# Patient Record
Sex: Female | Born: 1945 | Race: Black or African American | Hispanic: No | Marital: Married | Smoking: Never smoker
Health system: Southern US, Community
[De-identification: ages and names within clinical notes are randomized; demographics above are authoritative.]

## PROBLEM LIST (undated history)

## (undated) DIAGNOSIS — E785 Hyperlipidemia, unspecified: Secondary | ICD-10-CM

## (undated) DIAGNOSIS — M1990 Unspecified osteoarthritis, unspecified site: Secondary | ICD-10-CM

## (undated) DIAGNOSIS — I639 Cerebral infarction, unspecified: Secondary | ICD-10-CM

## (undated) DIAGNOSIS — I10 Essential (primary) hypertension: Secondary | ICD-10-CM

## (undated) DIAGNOSIS — E119 Type 2 diabetes mellitus without complications: Secondary | ICD-10-CM

## (undated) DIAGNOSIS — Z972 Presence of dental prosthetic device (complete) (partial): Secondary | ICD-10-CM

## (undated) DIAGNOSIS — K219 Gastro-esophageal reflux disease without esophagitis: Secondary | ICD-10-CM

## (undated) HISTORY — PX: VAGINAL HYSTERECTOMY: SUR661

## (undated) HISTORY — PX: TUBAL LIGATION: SHX77

## (undated) HISTORY — PX: UPPER GI ENDOSCOPY: SHX6162

## (undated) HISTORY — PX: COLONOSCOPY: SHX174

---

## 2004-08-06 ENCOUNTER — Ambulatory Visit: Payer: Self-pay | Admitting: Unknown Physician Specialty

## 2006-04-14 ENCOUNTER — Emergency Department: Payer: Self-pay | Admitting: Emergency Medicine

## 2013-10-28 ENCOUNTER — Ambulatory Visit: Payer: Self-pay | Admitting: Gastroenterology

## 2014-01-27 DIAGNOSIS — I639 Cerebral infarction, unspecified: Secondary | ICD-10-CM

## 2014-01-27 HISTORY — DX: Cerebral infarction, unspecified: I63.9

## 2014-02-13 DIAGNOSIS — I10 Essential (primary) hypertension: Secondary | ICD-10-CM | POA: Diagnosis not present

## 2014-02-13 DIAGNOSIS — E782 Mixed hyperlipidemia: Secondary | ICD-10-CM | POA: Diagnosis not present

## 2014-02-13 DIAGNOSIS — Z8673 Personal history of transient ischemic attack (TIA), and cerebral infarction without residual deficits: Secondary | ICD-10-CM | POA: Diagnosis not present

## 2014-02-13 DIAGNOSIS — E1165 Type 2 diabetes mellitus with hyperglycemia: Secondary | ICD-10-CM | POA: Diagnosis not present

## 2014-03-06 DIAGNOSIS — J029 Acute pharyngitis, unspecified: Secondary | ICD-10-CM | POA: Diagnosis not present

## 2014-03-06 DIAGNOSIS — H6503 Acute serous otitis media, bilateral: Secondary | ICD-10-CM | POA: Diagnosis not present

## 2014-03-06 DIAGNOSIS — I10 Essential (primary) hypertension: Secondary | ICD-10-CM | POA: Diagnosis not present

## 2014-03-16 DIAGNOSIS — I10 Essential (primary) hypertension: Secondary | ICD-10-CM | POA: Diagnosis not present

## 2014-03-16 DIAGNOSIS — E1165 Type 2 diabetes mellitus with hyperglycemia: Secondary | ICD-10-CM | POA: Diagnosis not present

## 2014-03-16 DIAGNOSIS — Z8673 Personal history of transient ischemic attack (TIA), and cerebral infarction without residual deficits: Secondary | ICD-10-CM | POA: Diagnosis not present

## 2014-04-07 DIAGNOSIS — M549 Dorsalgia, unspecified: Secondary | ICD-10-CM | POA: Diagnosis not present

## 2014-04-18 ENCOUNTER — Inpatient Hospital Stay: Payer: Self-pay | Admitting: Internal Medicine

## 2014-04-18 DIAGNOSIS — R55 Syncope and collapse: Secondary | ICD-10-CM | POA: Diagnosis not present

## 2014-04-18 DIAGNOSIS — I6782 Cerebral ischemia: Secondary | ICD-10-CM | POA: Diagnosis not present

## 2014-04-18 DIAGNOSIS — E119 Type 2 diabetes mellitus without complications: Secondary | ICD-10-CM | POA: Diagnosis not present

## 2014-04-18 DIAGNOSIS — Z8673 Personal history of transient ischemic attack (TIA), and cerebral infarction without residual deficits: Secondary | ICD-10-CM | POA: Diagnosis not present

## 2014-04-18 DIAGNOSIS — E785 Hyperlipidemia, unspecified: Secondary | ICD-10-CM | POA: Diagnosis not present

## 2014-04-18 DIAGNOSIS — I639 Cerebral infarction, unspecified: Secondary | ICD-10-CM | POA: Diagnosis not present

## 2014-04-18 DIAGNOSIS — I10 Essential (primary) hypertension: Secondary | ICD-10-CM | POA: Diagnosis not present

## 2014-04-18 DIAGNOSIS — Z9889 Other specified postprocedural states: Secondary | ICD-10-CM | POA: Diagnosis not present

## 2014-04-18 DIAGNOSIS — Z880 Allergy status to penicillin: Secondary | ICD-10-CM | POA: Diagnosis not present

## 2014-04-18 DIAGNOSIS — N289 Disorder of kidney and ureter, unspecified: Secondary | ICD-10-CM | POA: Diagnosis not present

## 2014-04-18 DIAGNOSIS — T161XXA Foreign body in right ear, initial encounter: Secondary | ICD-10-CM | POA: Diagnosis not present

## 2014-04-18 DIAGNOSIS — G319 Degenerative disease of nervous system, unspecified: Secondary | ICD-10-CM | POA: Diagnosis not present

## 2014-04-18 DIAGNOSIS — M6281 Muscle weakness (generalized): Secondary | ICD-10-CM | POA: Diagnosis not present

## 2014-04-18 DIAGNOSIS — R531 Weakness: Secondary | ICD-10-CM | POA: Diagnosis not present

## 2014-04-18 DIAGNOSIS — R030 Elevated blood-pressure reading, without diagnosis of hypertension: Secondary | ICD-10-CM | POA: Diagnosis not present

## 2014-04-18 DIAGNOSIS — R270 Ataxia, unspecified: Secondary | ICD-10-CM | POA: Diagnosis not present

## 2014-04-18 DIAGNOSIS — Z90710 Acquired absence of both cervix and uterus: Secondary | ICD-10-CM | POA: Diagnosis not present

## 2014-04-18 DIAGNOSIS — R42 Dizziness and giddiness: Secondary | ICD-10-CM | POA: Diagnosis not present

## 2014-04-18 DIAGNOSIS — I6350 Cerebral infarction due to unspecified occlusion or stenosis of unspecified cerebral artery: Secondary | ICD-10-CM | POA: Diagnosis not present

## 2014-04-20 DIAGNOSIS — T161XXA Foreign body in right ear, initial encounter: Secondary | ICD-10-CM | POA: Diagnosis not present

## 2014-04-21 ENCOUNTER — Ambulatory Visit: Admit: 2014-04-21 | Disposition: A | Discharge status: Home / Self Care | Payer: Self-pay | Admitting: Neurology

## 2014-04-21 LAB — CBC WITH DIFFERENTIAL/PLATELET
BASOS PCT: 0.4 %
Basophil #: 0 10*3/uL (ref 0.0–0.1)
Eosinophil #: 0.1 10*3/uL (ref 0.0–0.7)
Eosinophil %: 0.5 %
HCT: 39.2 % (ref 35.0–47.0)
HGB: 12.2 g/dL (ref 12.0–16.0)
LYMPHS ABS: 1.9 10*3/uL (ref 1.0–3.6)
LYMPHS PCT: 18.9 %
MCH: 21.8 pg — ABNORMAL LOW (ref 26.0–34.0)
MCHC: 31.1 g/dL — AB (ref 32.0–36.0)
MCV: 70 fL — AB (ref 80–100)
MONOS PCT: 4.4 %
Monocyte #: 0.5 x10 3/mm (ref 0.2–0.9)
NEUTROS ABS: 7.8 10*3/uL — AB (ref 1.4–6.5)
Neutrophil %: 75.8 %
PLATELETS: 259 10*3/uL (ref 150–440)
RBC: 5.59 10*6/uL — ABNORMAL HIGH (ref 3.80–5.20)
RDW: 17.5 % — AB (ref 11.5–14.5)
WBC: 10.2 10*3/uL (ref 3.6–11.0)

## 2014-04-21 LAB — BASIC METABOLIC PANEL
Anion Gap: 8 (ref 7–16)
BUN: 30 mg/dL — ABNORMAL HIGH
CO2: 26 mmol/L
CREATININE: 1.2 mg/dL — AB
Calcium, Total: 8.9 mg/dL
Chloride: 104 mmol/L
EGFR (African American): 53 — ABNORMAL LOW
EGFR (Non-African Amer.): 46 — ABNORMAL LOW
Glucose: 126 mg/dL — ABNORMAL HIGH
Potassium: 4.1 mmol/L
Sodium: 138 mmol/L

## 2014-05-03 DIAGNOSIS — M5432 Sciatica, left side: Secondary | ICD-10-CM | POA: Diagnosis not present

## 2014-05-03 DIAGNOSIS — N189 Chronic kidney disease, unspecified: Secondary | ICD-10-CM | POA: Diagnosis not present

## 2014-05-03 DIAGNOSIS — M5136 Other intervertebral disc degeneration, lumbar region: Secondary | ICD-10-CM | POA: Diagnosis not present

## 2014-05-03 DIAGNOSIS — I10 Essential (primary) hypertension: Secondary | ICD-10-CM | POA: Diagnosis not present

## 2014-05-03 DIAGNOSIS — E1122 Type 2 diabetes mellitus with diabetic chronic kidney disease: Secondary | ICD-10-CM | POA: Diagnosis not present

## 2014-05-03 DIAGNOSIS — Z789 Other specified health status: Secondary | ICD-10-CM | POA: Diagnosis not present

## 2014-05-08 DIAGNOSIS — M1612 Unilateral primary osteoarthritis, left hip: Secondary | ICD-10-CM | POA: Diagnosis not present

## 2014-05-08 DIAGNOSIS — Z7982 Long term (current) use of aspirin: Secondary | ICD-10-CM | POA: Diagnosis not present

## 2014-05-08 DIAGNOSIS — M25552 Pain in left hip: Secondary | ICD-10-CM | POA: Diagnosis not present

## 2014-05-08 DIAGNOSIS — M545 Low back pain: Secondary | ICD-10-CM | POA: Diagnosis not present

## 2014-05-08 DIAGNOSIS — M549 Dorsalgia, unspecified: Secondary | ICD-10-CM | POA: Diagnosis not present

## 2014-05-08 DIAGNOSIS — I10 Essential (primary) hypertension: Secondary | ICD-10-CM | POA: Diagnosis not present

## 2014-05-08 DIAGNOSIS — Z7902 Long term (current) use of antithrombotics/antiplatelets: Secondary | ICD-10-CM | POA: Diagnosis not present

## 2014-05-08 DIAGNOSIS — R52 Pain, unspecified: Secondary | ICD-10-CM | POA: Diagnosis not present

## 2014-05-08 DIAGNOSIS — E119 Type 2 diabetes mellitus without complications: Secondary | ICD-10-CM | POA: Diagnosis not present

## 2014-05-08 DIAGNOSIS — Z8673 Personal history of transient ischemic attack (TIA), and cerebral infarction without residual deficits: Secondary | ICD-10-CM | POA: Diagnosis not present

## 2014-05-24 DIAGNOSIS — M4806 Spinal stenosis, lumbar region: Secondary | ICD-10-CM | POA: Diagnosis not present

## 2014-05-24 DIAGNOSIS — M47816 Spondylosis without myelopathy or radiculopathy, lumbar region: Secondary | ICD-10-CM | POA: Diagnosis not present

## 2014-05-24 DIAGNOSIS — M5116 Intervertebral disc disorders with radiculopathy, lumbar region: Secondary | ICD-10-CM | POA: Diagnosis not present

## 2014-05-24 DIAGNOSIS — M5117 Intervertebral disc disorders with radiculopathy, lumbosacral region: Secondary | ICD-10-CM | POA: Diagnosis not present

## 2014-05-28 NOTE — Consult Note (Signed)
PATIENT NAME:  Ann OddiOTEAT, Ann Price MR#:  [id number] DATE OF BIRTH:  02-Jan-1946  DATE OF CONSULTATION:  04/18/2014  REFERRING PHYSICIAN:  Dr. Thedore MinsSingh. CONSULTING PHYSICIAN:  Davina Pokehapman T. Kamryn Messineo, MD  CONSULTATION REQUESTED FOR:  Foreign body in right ear.   HISTORY OF PRESENT ILLNESS: This is a 69 year old female who was admitted through the Emergency Room for weakness, and disorientation, and slurred speech.  This was a question of possible CVA. She underwent a physical exam and CT that showed a metallic foreign body in the right ear.  ENT was called for evaluation and possible removal of foreign body before MRI was performed.   PAST MEDICAL HISTORY: Significant for TIA, diabetes, hypertension, hyperlipidemia.    SURGICAL HISTORY:  hysterectomy, eyelid surgery.   ALLERGIES: ASPIRIN AND PENICILLIN.   MEDICATIONS: Noted and listed in the chart.   PHYSICAL EXAMINATION: I am seeing the patient in the echocardiogram as she was not in her room, so I saw her in the echo lab lying supine, answering questions appropriately. When I asked her about the foreign body, she says she thinks it has been in there for some time, but she is not sure.  She thinks it is part of an earring.   PHYSICAL EXAMINATION:   HEENT AND NECK:  The left ear was clear.  The anterior nose is patent. The oral cavity and oropharynx are benign. Palpation of the neck unremarkable. Examination of the right ear showed a metallic foreign body completely filling the external ear. Multiple attempts at the bedside using an alligator forcep to remove the tube were unsuccessful and due to pain the process needed to be aborted.   HEART:  Regular rhythm. LUNGS:  Clear to auscultation.  IMPRESSION:  Speaking with the nurse as well as with the nursing supervisor, it was not felt safe for her to be transported via wheelchair across the parking lot to our office for removal and I did not feel it warranted an EMS transport vehicle to bring her to the  office. Therefore, I spoke with my partner Dr. Bud Facereighton Vaught who is in the operating room tomorrow. He has agreed to take her to the operating room and likely without any anesthesia remove his easily under the microscope. I have relayed that to Dr. Thedore MinsSingh as well as the nurse and nursing supervisor, and hopefully we will get this scheduled tomorrow.     ____________________________ Davina Pokehapman T. Miyako Oelke, MD ctm:sp D: 04/19/2014 14:02:00 ET T: 04/19/2014 14:45:22 ET JOB#: [id number]  cc: Davina Pokehapman T. Trenika Hudson, MD, <Dictator> Davina PokeHAPMAN T Kabria Hetzer MD ELECTRONICALLY SIGNED 04/21/2014 8:15

## 2014-05-28 NOTE — Op Note (Signed)
PATIENT NAME:  Ann OddiOTEAT, Ann Price MR#:  [id number] DATE OF BIRTH:  05-26-1945  DATE OF PROCEDURE:  04/20/2014  PREOPERATIVE DIAGNOSIS: Foreign body in the right ear.   POSTOPERATIVE DIAGNOSIS: Foreign body in the right ear.Marland Kitchen.  PROCEDURE PERFORMED: Binocular microscopy with removal of foreign body under local anesthesia.   ESTIMATED BLOOD LOSS: Zero.   INTRAVENOUS FLUIDS: None.   SPECIMENS: Foreign body of the right ear consistent with an earring back.   COMPLICATIONS: None.  MEDICATIONS: Lidocaine 1% at 0.75 mL with 1:100,000 epinephrine.   INDICATIONS FOR PROCEDURE: The patient is a 69 year old female with a history of foreign body in the right ear as well as possible stroke. The foreign body metallic was noted and the patient was unable to get an MRI scan. This was attempted to be removed at bedside and due to patient tolerance was unable to be done.   OPERATIVE FINDINGS: Foreign body consistent with a metallic back of any hearing that was wedged against the drum. It looks like it had been in there for quite an amount of time given the remodeling of the canal skin around it. The canal had to be numbed with 1% lidocaine prior to removal.   DESCRIPTION OF PROCEDURE: The patient identified in holding, benefits and risks of the procedure were discussed and consent was reviewed. The patient was taken to the operating room and placed in a supine position in her hospital bed. The operating microscope was brought into the field. An appropriate size speculum was placed in the patient's right external auditory canal. This demonstrated a metallic foreign body. This was attempted be removed with a right angle pick as well as a 45-degree curved alligator forceps. Due to the patient's tolerance, this unfortunately was unable to be performed. Therefore, the patient's ear was anesthetized with 0.75 mL of 1% lidocaine with 1:100,000 epinephrine on a 25-gauge needle in the anterior and posterior quadrant of the  canal. After this was performed, the patient's pain tolerance was improved and the foreign body was then be easily removed with 45-degree alligator forceps. Ciprodex drops were placed and the care of the patient was transferred to anesthesia.    ____________________________ Kyung Ruddreighton C. Lue [place], MD ccv:bm D: 04/20/2014 14:30:02 ET T: 04/21/2014 00:43:01 ET JOB#: [id number]  cc: Kyung Ruddreighton C. Diona Peregoy, MD, <Dictator> Kyung RuddREIGHTON C Jaelee Laughter MD ELECTRONICALLY SIGNED 05/10/2014 17:33
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
## 2014-05-28 NOTE — Discharge Summary (Signed)
PATIENT NAME:  Ann Price, Ann Price MR#:  [id number] DATE OF BIRTH:  [birth year]  DATE OF ADMISSION:  04/18/2014 DATE OF DISCHARGE:  04/21/2014  DISCHARGE DIAGNOSES:  1. Right caudate cerebrovascular accident.  2. Left sciatica, radiculopathy.  3. Diabetes mellitus, non-insulin-requiring.  4. Cardiovascular disease.  5. Hypertension.  6. Hyperlipidemia.  7. Foreign body right ear.   DISCHARGE MEDICATIONS: Actos 45 mg daily, omeprazole 20 mg daily, metformin 500 mg 2 tabs b.i.d., losartan 100 mg daily, HCTZ 25 mg at bedtime, glipizide XL 10 mg 2 tabs daily, Plavix 75 mg daily, atorvastatin 80 mg at bedtime, atenolol 50 mg at bedtime, amlodipine 10 mg at bedtime, aspirin 81 mg daily x 2 weeks, etodolac 400 mg b.i.d. x 10 days.   REASON FOR ADMISSION: A 69 year old female who presents with left arm weakness and left sciatica. Please see H and P for HPI and physical exam.   HOSPITAL COURSE: The patient was admitted, found to have a right caudate infarct by MRI, carotid Doppler negative. Her left arm dramatically improved. Aspirin irritates her stomach and she said she will just take that with food, but is not allergic to it per se. She will be on a baby aspirin for 2 weeks. Her main complaint was left sciatica. She was given Solu-Medrol 40 mg IV x 1 and will be on a short course of etodolac, watching gastric symptoms. She will follow up with Dr. Thedore MinsSingh in 1 week, continue tight control of diabetes and hypertension.      ____________________________ Danella PentonMark F. Wyoma Genson, MD mfm:bu D: 04/21/2014 08:05:53 ET T: 04/21/2014 13:49:57 ET JOB#: [id number]  cc: Danella PentonMark F. Raeshawn Tafolla, MD, <Dictator> Danella PentonMARK F Carolann Brazell MD ELECTRONICALLY SIGNED 04/22/2014 10:07

## 2014-05-28 NOTE — Consult Note (Signed)
PATIENT NAME:  Ann OddiOTEAT, Amiah L MR#:  [id number] DATE OF BIRTH:  Aug 27, 1945  DATE OF CONSULTATION:  04/21/2014  REFERRING PHYSICIAN:   CONSULTING PHYSICIAN:  Pauletta BrownsYuriy Anastassia Noack, MD  REASON FOR CONSULTATION:  Suspected stroke.   HISTORY OF PRESENT ILLNESS: A 69 year old female admitted with left-sided weakness and foreign body found in the right ear.  The patient was on Plavix and aspirin at home. The patient complained of left hand grip weakness. The patient was found to have right quadrant infarct strength significantly improved. The patient is able to move her upper left and left lower extremity. The patient was able to ambulate with limited assistance. The patient is on Plavix and statin at home.   HOME MEDICATIONS: Reviewed.   ALLERGIES: ASPIRIN.   NEUROLOGICAL EVALUATION:  The patient is alert, awake, oriented to time, place, location and the reason why she is in the hospital.  Facial sensation intact.  Facial motor is intact. Tongue is midline. Uvula elevates symmetrically. Shoulder shrug intact.  Motor strength: Left hand grip weakness, left lower extremity is symmetrical, right lower extremity no focal weakness. Sensation intact.  Reflexes symmetrical. Coordination intact.   IMPRESSION: A 69 year old female right caudate infarct with left hand grip weakness that has improved. The patient is being discharged status post ENT procedure.  Continue Plavix and statin. Follow up with neurology as an outpatient.   Thank you, it was a pleasure seeing this patient.     ____________________________ Pauletta BrownsYuriy Ronalda Walpole, MD yz:DT D: 04/21/2014 12:11:41 ET T: 04/21/2014 12:46:04 ET JOB#: [id number]  cc: Pauletta BrownsYuriy Justinian Miano, MD, <Dictator> Pauletta BrownsYURIY Skilynn Durney MD ELECTRONICALLY SIGNED 05/02/2014 15:40

## 2014-05-29 DIAGNOSIS — R609 Edema, unspecified: Secondary | ICD-10-CM | POA: Diagnosis not present

## 2014-05-29 DIAGNOSIS — M5416 Radiculopathy, lumbar region: Secondary | ICD-10-CM | POA: Diagnosis not present

## 2014-06-12 DIAGNOSIS — I6990 Unspecified sequelae of unspecified cerebrovascular disease: Secondary | ICD-10-CM | POA: Diagnosis not present

## 2014-06-12 DIAGNOSIS — M5416 Radiculopathy, lumbar region: Secondary | ICD-10-CM | POA: Diagnosis not present

## 2014-06-12 DIAGNOSIS — M25552 Pain in left hip: Secondary | ICD-10-CM | POA: Diagnosis not present

## 2014-07-10 ENCOUNTER — Other Ambulatory Visit: Payer: Self-pay | Admitting: Internal Medicine

## 2014-07-10 DIAGNOSIS — Z1239 Encounter for other screening for malignant neoplasm of breast: Secondary | ICD-10-CM | POA: Diagnosis not present

## 2014-07-10 DIAGNOSIS — I10 Essential (primary) hypertension: Secondary | ICD-10-CM | POA: Diagnosis not present

## 2014-07-10 DIAGNOSIS — Z1231 Encounter for screening mammogram for malignant neoplasm of breast: Secondary | ICD-10-CM

## 2014-07-10 DIAGNOSIS — D649 Anemia, unspecified: Secondary | ICD-10-CM | POA: Diagnosis not present

## 2014-07-10 DIAGNOSIS — E1165 Type 2 diabetes mellitus with hyperglycemia: Secondary | ICD-10-CM | POA: Diagnosis not present

## 2014-07-11 DIAGNOSIS — M5416 Radiculopathy, lumbar region: Secondary | ICD-10-CM | POA: Diagnosis not present

## 2014-07-18 DIAGNOSIS — M5416 Radiculopathy, lumbar region: Secondary | ICD-10-CM | POA: Diagnosis not present

## 2014-07-20 ENCOUNTER — Ambulatory Visit: Payer: Self-pay

## 2014-07-25 DIAGNOSIS — M5416 Radiculopathy, lumbar region: Secondary | ICD-10-CM | POA: Diagnosis not present

## 2014-08-01 DIAGNOSIS — M5416 Radiculopathy, lumbar region: Secondary | ICD-10-CM | POA: Diagnosis not present

## 2014-08-10 ENCOUNTER — Other Ambulatory Visit: Payer: Self-pay | Admitting: Internal Medicine

## 2014-08-10 ENCOUNTER — Ambulatory Visit
Admission: RE | Admit: 2014-08-10 | Discharge: 2014-08-10 | Disposition: A | Discharge status: Home / Self Care | Payer: Commercial Managed Care - HMO | Source: Ambulatory Visit | Attending: Internal Medicine | Admitting: Internal Medicine

## 2014-08-10 DIAGNOSIS — Z1231 Encounter for screening mammogram for malignant neoplasm of breast: Secondary | ICD-10-CM | POA: Insufficient documentation

## 2014-08-10 DIAGNOSIS — R922 Inconclusive mammogram: Secondary | ICD-10-CM | POA: Diagnosis not present

## 2014-08-17 ENCOUNTER — Other Ambulatory Visit: Payer: Self-pay | Admitting: Internal Medicine

## 2014-08-17 DIAGNOSIS — R921 Mammographic calcification found on diagnostic imaging of breast: Secondary | ICD-10-CM

## 2014-08-17 DIAGNOSIS — R928 Other abnormal and inconclusive findings on diagnostic imaging of breast: Secondary | ICD-10-CM

## 2014-08-22 ENCOUNTER — Other Ambulatory Visit: Payer: Self-pay | Admitting: Internal Medicine

## 2014-08-22 ENCOUNTER — Ambulatory Visit
Admission: RE | Admit: 2014-08-22 | Discharge: 2014-08-22 | Disposition: A | Discharge status: Home / Self Care | Payer: Commercial Managed Care - HMO | Source: Ambulatory Visit | Attending: Internal Medicine | Admitting: Internal Medicine

## 2014-08-22 ENCOUNTER — Ambulatory Visit: Payer: Commercial Managed Care - HMO

## 2014-08-22 DIAGNOSIS — R921 Mammographic calcification found on diagnostic imaging of breast: Secondary | ICD-10-CM | POA: Diagnosis not present

## 2014-08-22 DIAGNOSIS — R928 Other abnormal and inconclusive findings on diagnostic imaging of breast: Secondary | ICD-10-CM

## 2014-08-22 DIAGNOSIS — R922 Inconclusive mammogram: Secondary | ICD-10-CM | POA: Diagnosis not present

## 2014-08-30 LAB — LIPID PANEL
CHOLESTEROL: 181 mg/dL
HDL Cholesterol: 49 mg/dL
LDL CHOLESTEROL, CALC: 110 mg/dL — AB
Triglycerides: 111 mg/dL
VLDL CHOLESTEROL, CALC: 22 mg/dL

## 2014-08-30 LAB — BASIC METABOLIC PANEL
Anion Gap: 6 — ABNORMAL LOW (ref 7–16)
BUN: 28 mg/dL — AB
Calcium, Total: 9 mg/dL
Chloride: 106 mmol/L
Co2: 26 mmol/L
Creatinine: 1.21 mg/dL — ABNORMAL HIGH
EGFR (African American): 53 — ABNORMAL LOW
EGFR (Non-African Amer.): 45 — ABNORMAL LOW
Glucose: 155 mg/dL — ABNORMAL HIGH
POTASSIUM: 4 mmol/L
SODIUM: 138 mmol/L

## 2014-08-30 LAB — URINALYSIS, COMPLETE
BILIRUBIN, UR: NEGATIVE
Bacteria: NONE SEEN
Blood: NEGATIVE
Glucose,UR: 50 mg/dL (ref 0–75)
Ketone: NEGATIVE
Leukocyte Esterase: NEGATIVE
NITRITE: NEGATIVE
PH: 5 (ref 4.5–8.0)
Protein: NEGATIVE
Specific Gravity: 1.028 (ref 1.003–1.030)

## 2014-08-30 LAB — CBC WITH DIFFERENTIAL/PLATELET
Basophil #: 0 10*3/uL (ref 0.0–0.1)
Basophil %: 0.5 %
Eosinophil #: 0.1 10*3/uL (ref 0.0–0.7)
Eosinophil %: 0.7 %
HCT: 38.5 % (ref 35.0–47.0)
HGB: 11.8 g/dL — ABNORMAL LOW (ref 12.0–16.0)
Lymphocyte #: 2.3 10*3/uL (ref 1.0–3.6)
Lymphocyte %: 23.5 %
MCH: 21.7 pg — ABNORMAL LOW (ref 26.0–34.0)
MCHC: 30.6 g/dL — ABNORMAL LOW (ref 32.0–36.0)
MCV: 71 fL — AB (ref 80–100)
Monocyte #: 0.5 x10 3/mm (ref 0.2–0.9)
Monocyte %: 5.1 %
NEUTROS ABS: 7 10*3/uL — AB (ref 1.4–6.5)
NEUTROS PCT: 70.2 %
Platelet: 253 10*3/uL (ref 150–440)
RBC: 5.44 10*6/uL — AB (ref 3.80–5.20)
RDW: 17.3 % — ABNORMAL HIGH (ref 11.5–14.5)
WBC: 10 10*3/uL (ref 3.6–11.0)

## 2014-10-04 DIAGNOSIS — D649 Anemia, unspecified: Secondary | ICD-10-CM | POA: Diagnosis not present

## 2014-10-04 DIAGNOSIS — E1165 Type 2 diabetes mellitus with hyperglycemia: Secondary | ICD-10-CM | POA: Diagnosis not present

## 2014-10-09 DIAGNOSIS — E1165 Type 2 diabetes mellitus with hyperglycemia: Secondary | ICD-10-CM | POA: Diagnosis not present

## 2014-10-09 DIAGNOSIS — E1122 Type 2 diabetes mellitus with diabetic chronic kidney disease: Secondary | ICD-10-CM | POA: Diagnosis not present

## 2014-10-09 DIAGNOSIS — Z8673 Personal history of transient ischemic attack (TIA), and cerebral infarction without residual deficits: Secondary | ICD-10-CM | POA: Diagnosis not present

## 2014-10-09 DIAGNOSIS — Z0000 Encounter for general adult medical examination without abnormal findings: Secondary | ICD-10-CM | POA: Diagnosis not present

## 2015-01-05 ENCOUNTER — Emergency Department
Admission: EM | Admit: 2015-01-05 | Discharge: 2015-01-05 | Disposition: A | Discharge status: Home / Self Care | Payer: No Typology Code available for payment source | Attending: Emergency Medicine | Admitting: Emergency Medicine

## 2015-01-05 ENCOUNTER — Encounter: Payer: Self-pay | Admitting: Emergency Medicine

## 2015-01-05 ENCOUNTER — Emergency Department: Payer: No Typology Code available for payment source

## 2015-01-05 DIAGNOSIS — E119 Type 2 diabetes mellitus without complications: Secondary | ICD-10-CM | POA: Insufficient documentation

## 2015-01-05 DIAGNOSIS — Y998 Other external cause status: Secondary | ICD-10-CM | POA: Diagnosis not present

## 2015-01-05 DIAGNOSIS — M25461 Effusion, right knee: Secondary | ICD-10-CM | POA: Diagnosis not present

## 2015-01-05 DIAGNOSIS — S8001XA Contusion of right knee, initial encounter: Secondary | ICD-10-CM | POA: Insufficient documentation

## 2015-01-05 DIAGNOSIS — S39012A Strain of muscle, fascia and tendon of lower back, initial encounter: Secondary | ICD-10-CM | POA: Diagnosis not present

## 2015-01-05 DIAGNOSIS — S8002XA Contusion of left knee, initial encounter: Secondary | ICD-10-CM | POA: Diagnosis not present

## 2015-01-05 DIAGNOSIS — Y92410 Unspecified street and highway as the place of occurrence of the external cause: Secondary | ICD-10-CM | POA: Insufficient documentation

## 2015-01-05 DIAGNOSIS — I10 Essential (primary) hypertension: Secondary | ICD-10-CM | POA: Insufficient documentation

## 2015-01-05 DIAGNOSIS — S3992XA Unspecified injury of lower back, initial encounter: Secondary | ICD-10-CM | POA: Diagnosis not present

## 2015-01-05 DIAGNOSIS — Y9389 Activity, other specified: Secondary | ICD-10-CM | POA: Diagnosis not present

## 2015-01-05 DIAGNOSIS — M25562 Pain in left knee: Secondary | ICD-10-CM | POA: Diagnosis not present

## 2015-01-05 DIAGNOSIS — M2550 Pain in unspecified joint: Secondary | ICD-10-CM | POA: Diagnosis not present

## 2015-01-05 DIAGNOSIS — M549 Dorsalgia, unspecified: Secondary | ICD-10-CM | POA: Diagnosis not present

## 2015-01-05 DIAGNOSIS — M545 Low back pain: Secondary | ICD-10-CM | POA: Diagnosis not present

## 2015-01-05 HISTORY — DX: Cerebral infarction, unspecified: I63.9

## 2015-01-05 HISTORY — DX: Hyperlipidemia, unspecified: E78.5

## 2015-01-05 HISTORY — DX: Type 2 diabetes mellitus without complications: E11.9

## 2015-01-05 HISTORY — DX: Essential (primary) hypertension: I10

## 2015-01-05 MED ORDER — HYDROCODONE-ACETAMINOPHEN 5-325 MG PO TABS: 1.0000 | ORAL_TABLET | Freq: Four times a day (QID) | ORAL | Status: DC | PRN

## 2015-01-05 MED ORDER — HYDROCODONE-ACETAMINOPHEN 5-325 MG PO TABS
1.0000 | ORAL_TABLET | Freq: Once | ORAL | Status: AC
  Administered 2015-01-05: 1 via ORAL
  Filled 2015-01-05: qty 1

## 2015-01-05 MED ORDER — DIAZEPAM 2 MG PO TABS: 2.0000 mg | ORAL_TABLET | Freq: Three times a day (TID) | ORAL | Status: DC | PRN

## 2015-01-05 MED ORDER — NAPROXEN 500 MG PO TBEC: 500.0000 mg | DELAYED_RELEASE_TABLET | Freq: Two times a day (BID) | ORAL | Status: DC

## 2015-01-05 MED ORDER — DIAZEPAM 5 MG PO TABS
5.0000 mg | ORAL_TABLET | Freq: Once | ORAL | Status: AC
  Administered 2015-01-05: 5 mg via ORAL
  Filled 2015-01-05: qty 1

## 2015-01-05 NOTE — ED Notes (Signed)
Patient transported to X-ray 
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
## 2015-01-16 DIAGNOSIS — M25562 Pain in left knee: Secondary | ICD-10-CM | POA: Diagnosis not present

## 2015-01-16 DIAGNOSIS — M25561 Pain in right knee: Secondary | ICD-10-CM | POA: Diagnosis not present

## 2015-02-07 ENCOUNTER — Other Ambulatory Visit: Payer: Self-pay | Admitting: Orthopedic Surgery

## 2015-02-07 DIAGNOSIS — M25551 Pain in right hip: Secondary | ICD-10-CM | POA: Diagnosis not present

## 2015-02-07 DIAGNOSIS — M170 Bilateral primary osteoarthritis of knee: Secondary | ICD-10-CM | POA: Diagnosis not present

## 2015-02-07 DIAGNOSIS — S83231A Complex tear of medial meniscus, current injury, right knee, initial encounter: Secondary | ICD-10-CM

## 2015-02-21 DIAGNOSIS — Z794 Long term (current) use of insulin: Secondary | ICD-10-CM | POA: Diagnosis not present

## 2015-02-21 DIAGNOSIS — E1165 Type 2 diabetes mellitus with hyperglycemia: Secondary | ICD-10-CM | POA: Diagnosis not present

## 2015-02-21 DIAGNOSIS — I10 Essential (primary) hypertension: Secondary | ICD-10-CM | POA: Diagnosis not present

## 2015-02-21 DIAGNOSIS — N183 Chronic kidney disease, stage 3 (moderate): Secondary | ICD-10-CM | POA: Diagnosis not present

## 2015-02-21 DIAGNOSIS — E1122 Type 2 diabetes mellitus with diabetic chronic kidney disease: Secondary | ICD-10-CM | POA: Diagnosis not present

## 2015-02-27 ENCOUNTER — Ambulatory Visit
Admission: RE | Admit: 2015-02-27 | Discharge: 2015-02-27 | Disposition: A | Discharge status: Home / Self Care | Payer: No Typology Code available for payment source | Source: Ambulatory Visit | Attending: Orthopedic Surgery | Admitting: Orthopedic Surgery

## 2015-02-27 DIAGNOSIS — M23321 Other meniscus derangements, posterior horn of medial meniscus, right knee: Secondary | ICD-10-CM | POA: Diagnosis not present

## 2015-02-27 DIAGNOSIS — M25461 Effusion, right knee: Secondary | ICD-10-CM | POA: Diagnosis present

## 2015-02-27 DIAGNOSIS — M1711 Unilateral primary osteoarthritis, right knee: Secondary | ICD-10-CM | POA: Insufficient documentation

## 2015-02-27 DIAGNOSIS — M25561 Pain in right knee: Secondary | ICD-10-CM | POA: Diagnosis present

## 2015-02-27 DIAGNOSIS — S83231A Complex tear of medial meniscus, current injury, right knee, initial encounter: Secondary | ICD-10-CM

## 2015-02-27 DIAGNOSIS — M179 Osteoarthritis of knee, unspecified: Secondary | ICD-10-CM | POA: Diagnosis not present

## 2015-02-27 DIAGNOSIS — S83241A Other tear of medial meniscus, current injury, right knee, initial encounter: Secondary | ICD-10-CM | POA: Insufficient documentation

## 2015-03-09 DIAGNOSIS — S83231A Complex tear of medial meniscus, current injury, right knee, initial encounter: Secondary | ICD-10-CM | POA: Diagnosis not present

## 2015-03-14 ENCOUNTER — Encounter
Admission: RE | Admit: 2015-03-14 | Discharge: 2015-03-14 | Disposition: A | Discharge status: Home / Self Care | Payer: Commercial Managed Care - HMO | Source: Ambulatory Visit | Attending: Orthopedic Surgery | Admitting: Orthopedic Surgery

## 2015-03-14 ENCOUNTER — Other Ambulatory Visit: Payer: Self-pay

## 2015-03-14 DIAGNOSIS — Z01812 Encounter for preprocedural laboratory examination: Secondary | ICD-10-CM | POA: Insufficient documentation

## 2015-03-14 DIAGNOSIS — Z01810 Encounter for preprocedural cardiovascular examination: Secondary | ICD-10-CM | POA: Insufficient documentation

## 2015-03-14 HISTORY — DX: Gastro-esophageal reflux disease without esophagitis: K21.9

## 2015-03-14 LAB — APTT: aPTT: 29 seconds (ref 24–36)

## 2015-03-14 LAB — BASIC METABOLIC PANEL
Anion gap: 6 (ref 5–15)
BUN: 21 mg/dL — AB (ref 6–20)
CHLORIDE: 106 mmol/L (ref 101–111)
CO2: 26 mmol/L (ref 22–32)
CREATININE: 1.21 mg/dL — AB (ref 0.44–1.00)
Calcium: 8.9 mg/dL (ref 8.9–10.3)
GFR calc Af Amer: 51 mL/min — ABNORMAL LOW (ref >60)
GFR calc non Af Amer: 44 mL/min — ABNORMAL LOW (ref >60)
GLUCOSE: 199 mg/dL — AB (ref 65–99)
Potassium: 3.7 mmol/L (ref 3.5–5.1)
Sodium: 138 mmol/L (ref 135–145)

## 2015-03-14 LAB — CBC
HCT: 38 % (ref 35.0–47.0)
Hemoglobin: 11.9 g/dL — ABNORMAL LOW (ref 12.0–16.0)
MCH: 21.8 pg — AB (ref 26.0–34.0)
MCHC: 31.4 g/dL — AB (ref 32.0–36.0)
MCV: 69.5 fL — AB (ref 80.0–100.0)
PLATELETS: 204 10*3/uL (ref 150–440)
RBC: 5.47 MIL/uL — AB (ref 3.80–5.20)
RDW: 17.8 % — ABNORMAL HIGH (ref 11.5–14.5)
WBC: 5 10*3/uL (ref 3.6–11.0)

## 2015-03-14 LAB — PROTIME-INR
INR: 1.04
PROTHROMBIN TIME: 13.8 s (ref 11.4–15.0)

## 2015-03-14 NOTE — Patient Instructions (Signed)
  Your procedure is scheduled on: Thursday 03/22/15 Report to Day Surgery. 2ND FLOOR MEDICAL MALL ENTRANCE To find out your arrival time please call [phone] between 1PM - 3PM on Wednesday 03/21/15.  Remember: Instructions that are not followed completely may result in serious medical risk, up to and including death, or upon the discretion of your surgeon and anesthesiologist your surgery may need to be rescheduled.    __X__ 1. Do not eat food or drink liquids after midnight. No gum chewing or hard candies.     __X__ 2. No Alcohol for 24 hours before or after surgery.   ____ 3. Bring all medications with you on the day of surgery if instructed.    __X__ 4. Notify your doctor if there is any change in your medical condition     (cold, fever, infections).     Do not wear jewelry, make-up, hairpins, clips or nail polish.  Do not wear lotions, powders, or perfumes.   Do not shave 48 hours prior to surgery. Men may shave face and neck.  Do not bring valuables to the hospital.    [facility] is not responsible for any belongings or valuables.               Contacts, dentures or bridgework may not be worn into surgery.  Leave your suitcase in the car. After surgery it may be brought to your room.  For patients admitted to the hospital, discharge time is determined by your                treatment team.   Patients discharged the day of surgery will not be allowed to drive home.   Please read over the following fact sheets that you were given:   Surgical Site Infection Prevention   __X__ Take these medicines the morning of surgery with A SIP OF WATER:    1. AMLODIPINE  2. ATENOLOL  3. LOSARTAN  4.  5.  6.  ____ Fleet Enema (as directed)   __X__ Use CHG Soap as directed  ____ Use inhalers on the day of surgery  __X__ Stop metformin 2 days prior to surgery    ____ Take 1/2 of usual insulin dose the night before surgery and none on the morning of surgery.   __X__ Stop  Coumadin/Plavix/aspirin on PLEASE CONTACT DR [facility] TODAY TO FIND OUT WHEN IT WILL BE OK TO STOP YOUR CLOPIDOGREL (BLOOD THINNER) FOR SURGERY  __X__ TODAY STOP YOUR DICLOFENAC AND NAPROXEN   ____ Stop supplements until after surgery.    ____ Bring C-Pap to the hospital.

## 2015-03-14 NOTE — Pre-Procedure Instructions (Signed)
Medical clearance faxed to Dr Thedore Mins office receipt confirmation printed. Pt scheduled for new appt 03/16/15 at 3:45

## 2015-03-15 DIAGNOSIS — Z794 Long term (current) use of insulin: Secondary | ICD-10-CM | POA: Diagnosis not present

## 2015-03-15 DIAGNOSIS — E1122 Type 2 diabetes mellitus with diabetic chronic kidney disease: Secondary | ICD-10-CM | POA: Diagnosis not present

## 2015-03-15 DIAGNOSIS — E1165 Type 2 diabetes mellitus with hyperglycemia: Secondary | ICD-10-CM | POA: Diagnosis not present

## 2015-03-15 DIAGNOSIS — N183 Chronic kidney disease, stage 3 (moderate): Secondary | ICD-10-CM | POA: Diagnosis not present

## 2015-03-15 NOTE — Pre-Procedure Instructions (Signed)
Upon reviewing met B lab result, based on recent previous results, no significant change is noted.

## 2015-03-16 DIAGNOSIS — E1122 Type 2 diabetes mellitus with diabetic chronic kidney disease: Secondary | ICD-10-CM | POA: Diagnosis not present

## 2015-03-16 DIAGNOSIS — N183 Chronic kidney disease, stage 3 (moderate): Secondary | ICD-10-CM | POA: Diagnosis not present

## 2015-03-16 DIAGNOSIS — I10 Essential (primary) hypertension: Secondary | ICD-10-CM | POA: Diagnosis not present

## 2015-03-16 DIAGNOSIS — Z794 Long term (current) use of insulin: Secondary | ICD-10-CM | POA: Diagnosis not present

## 2015-03-16 DIAGNOSIS — Z01818 Encounter for other preprocedural examination: Secondary | ICD-10-CM | POA: Diagnosis not present

## 2015-03-16 DIAGNOSIS — E1165 Type 2 diabetes mellitus with hyperglycemia: Secondary | ICD-10-CM | POA: Diagnosis not present

## 2015-03-20 NOTE — Pre-Procedure Instructions (Addendum)
CLEARED BY DR [facility] HIGH RISK 03/19/15. CALLED FOR NOTE FROM 03/16/15 RECEIVED NOTE FROM DR [facility] AND DR ADAMS NOTIFIED HIGH RISK CLEARANCE.

## 2015-03-22 ENCOUNTER — Ambulatory Visit
Admission: RE | Admit: 2015-03-22 | Discharge: 2015-03-22 | Disposition: A | Discharge status: Home / Self Care | Payer: Commercial Managed Care - HMO | Source: Ambulatory Visit | Attending: Orthopedic Surgery | Admitting: Orthopedic Surgery

## 2015-03-22 ENCOUNTER — Ambulatory Visit: Payer: Commercial Managed Care - HMO | Admitting: Anesthesiology

## 2015-03-22 ENCOUNTER — Encounter
Admission: RE | Disposition: A | Discharge status: Home / Self Care | Payer: Self-pay | Source: Ambulatory Visit | Attending: Orthopedic Surgery

## 2015-03-22 ENCOUNTER — Encounter: Payer: Self-pay | Admitting: *Deleted

## 2015-03-22 DIAGNOSIS — M23221 Derangement of posterior horn of medial meniscus due to old tear or injury, right knee: Secondary | ICD-10-CM | POA: Diagnosis not present

## 2015-03-22 DIAGNOSIS — X58XXXA Exposure to other specified factors, initial encounter: Secondary | ICD-10-CM | POA: Diagnosis not present

## 2015-03-22 DIAGNOSIS — S83231A Complex tear of medial meniscus, current injury, right knee, initial encounter: Secondary | ICD-10-CM | POA: Diagnosis not present

## 2015-03-22 DIAGNOSIS — Z8249 Family history of ischemic heart disease and other diseases of the circulatory system: Secondary | ICD-10-CM | POA: Insufficient documentation

## 2015-03-22 DIAGNOSIS — Z888 Allergy status to other drugs, medicaments and biological substances status: Secondary | ICD-10-CM | POA: Diagnosis not present

## 2015-03-22 DIAGNOSIS — Z79899 Other long term (current) drug therapy: Secondary | ICD-10-CM | POA: Diagnosis not present

## 2015-03-22 DIAGNOSIS — S83239A Complex tear of medial meniscus, current injury, unspecified knee, initial encounter: Secondary | ICD-10-CM | POA: Diagnosis present

## 2015-03-22 DIAGNOSIS — E119 Type 2 diabetes mellitus without complications: Secondary | ICD-10-CM | POA: Diagnosis not present

## 2015-03-22 DIAGNOSIS — Z881 Allergy status to other antibiotic agents status: Secondary | ICD-10-CM | POA: Diagnosis not present

## 2015-03-22 DIAGNOSIS — Z880 Allergy status to penicillin: Secondary | ICD-10-CM | POA: Insufficient documentation

## 2015-03-22 DIAGNOSIS — E785 Hyperlipidemia, unspecified: Secondary | ICD-10-CM | POA: Diagnosis not present

## 2015-03-22 DIAGNOSIS — Z8673 Personal history of transient ischemic attack (TIA), and cerebral infarction without residual deficits: Secondary | ICD-10-CM | POA: Insufficient documentation

## 2015-03-22 DIAGNOSIS — Z833 Family history of diabetes mellitus: Secondary | ICD-10-CM | POA: Diagnosis not present

## 2015-03-22 DIAGNOSIS — I10 Essential (primary) hypertension: Secondary | ICD-10-CM | POA: Diagnosis not present

## 2015-03-22 DIAGNOSIS — Z90710 Acquired absence of both cervix and uterus: Secondary | ICD-10-CM | POA: Insufficient documentation

## 2015-03-22 DIAGNOSIS — Z794 Long term (current) use of insulin: Secondary | ICD-10-CM | POA: Diagnosis not present

## 2015-03-22 DIAGNOSIS — K219 Gastro-esophageal reflux disease without esophagitis: Secondary | ICD-10-CM | POA: Diagnosis not present

## 2015-03-22 HISTORY — PX: KNEE ARTHROSCOPY: SHX127

## 2015-03-22 LAB — GLUCOSE, CAPILLARY
GLUCOSE-CAPILLARY: 109 mg/dL — AB (ref 65–99)
Glucose-Capillary: 119 mg/dL — ABNORMAL HIGH (ref 65–99)

## 2015-03-22 SURGERY — ARTHROSCOPY, KNEE
Anesthesia: General | Site: Knee | Laterality: Right | Wound class: Clean

## 2015-03-22 MED ORDER — BUPIVACAINE-EPINEPHRINE (PF) 0.5% -1:200000 IJ SOLN
INTRAMUSCULAR | Status: AC
  Filled 2015-03-22: qty 30

## 2015-03-22 MED ORDER — ONDANSETRON HCL 4 MG/2ML IJ SOLN
INTRAMUSCULAR | Status: DC | PRN
  Administered 2015-03-22: 4 mg via INTRAVENOUS

## 2015-03-22 MED ORDER — DEXAMETHASONE SODIUM PHOSPHATE 10 MG/ML IJ SOLN
INTRAMUSCULAR | Status: DC | PRN
  Administered 2015-03-22: 5 mg via INTRAVENOUS

## 2015-03-22 MED ORDER — OXYCODONE HCL 5 MG PO TABS
5.0000 mg | ORAL_TABLET | Freq: Once | ORAL | Status: AC | PRN
  Administered 2015-03-22: 5 mg via ORAL

## 2015-03-22 MED ORDER — FAMOTIDINE 20 MG PO TABS
20.0000 mg | ORAL_TABLET | Freq: Once | ORAL | Status: AC
  Administered 2015-03-22: 20 mg via ORAL

## 2015-03-22 MED ORDER — FENTANYL CITRATE (PF) 100 MCG/2ML IJ SOLN
25.0000 ug | INTRAMUSCULAR | Status: DC | PRN
  Administered 2015-03-22 (×2): 50 ug via INTRAVENOUS

## 2015-03-22 MED ORDER — PROPOFOL 10 MG/ML IV BOLUS
INTRAVENOUS | Status: DC | PRN
  Administered 2015-03-22: 140 mg via INTRAVENOUS

## 2015-03-22 MED ORDER — HYDROCODONE-ACETAMINOPHEN 5-325 MG PO TABS: 1.0000 | ORAL_TABLET | Freq: Four times a day (QID) | ORAL | Status: DC | PRN

## 2015-03-22 MED ORDER — SODIUM CHLORIDE 0.9 % IV SOLN: INTRAVENOUS | Status: DC

## 2015-03-22 MED ORDER — LACTATED RINGERS IV SOLN
INTRAVENOUS | Status: DC | PRN
  Administered 2015-03-22: 15:00:00 via INTRAVENOUS

## 2015-03-22 MED ORDER — BUPIVACAINE-EPINEPHRINE (PF) 0.5% -1:200000 IJ SOLN
INTRAMUSCULAR | Status: DC | PRN
  Administered 2015-03-22: 30 mL

## 2015-03-22 MED ORDER — FENTANYL CITRATE (PF) 100 MCG/2ML IJ SOLN
INTRAMUSCULAR | Status: AC
  Administered 2015-03-22: 50 ug via INTRAVENOUS
  Filled 2015-03-22: qty 2

## 2015-03-22 MED ORDER — FENTANYL CITRATE (PF) 100 MCG/2ML IJ SOLN
INTRAMUSCULAR | Status: DC | PRN
  Administered 2015-03-22: 100 ug via INTRAVENOUS

## 2015-03-22 MED ORDER — MIDAZOLAM HCL 2 MG/2ML IJ SOLN
INTRAMUSCULAR | Status: DC | PRN
  Administered 2015-03-22: 2 mg via INTRAVENOUS

## 2015-03-22 MED ORDER — FAMOTIDINE 20 MG PO TABS
ORAL_TABLET | ORAL | Status: AC
  Administered 2015-03-22: 20 mg via ORAL
  Filled 2015-03-22: qty 1

## 2015-03-22 MED ORDER — OXYCODONE HCL 5 MG/5ML PO SOLN: 5.0000 mg | Freq: Once | ORAL | Status: AC | PRN

## 2015-03-22 MED ORDER — PHENYLEPHRINE HCL 10 MG/ML IJ SOLN
INTRAMUSCULAR | Status: DC | PRN
  Administered 2015-03-22: 15 ug via INTRAVENOUS

## 2015-03-22 MED ORDER — OXYCODONE HCL 5 MG PO TABS
ORAL_TABLET | ORAL | Status: AC
  Filled 2015-03-22: qty 1

## 2015-03-22 SURGICAL SUPPLY — 28 items
BANDAGE ACE 4X5 VEL STRL LF (GAUZE/BANDAGES/DRESSINGS) ×3 IMPLANT
BANDAGE ELASTIC 4 LF NS (GAUZE/BANDAGES/DRESSINGS) ×3 IMPLANT
BLADE FULL RADIUS 3.5 (BLADE) ×3 IMPLANT
BLADE INCISOR PLUS 4.5 (BLADE) ×3 IMPLANT
BLADE SHAVER 4.5 DBL SERAT CV (CUTTER) ×3 IMPLANT
BLADE SHAVER 4.5X7 STR FR (MISCELLANEOUS) ×3 IMPLANT
CHLORAPREP W/TINT 26ML (MISCELLANEOUS) ×3 IMPLANT
CUTTER AGGRESSIVE+ 3.5 (CUTTER) ×3 IMPLANT
GAUZE PETRO XEROFOAM 1X8 (MISCELLANEOUS) ×3 IMPLANT
GAUZE SPONGE 4X4 12PLY STRL (GAUZE/BANDAGES/DRESSINGS) ×3 IMPLANT
GLOVE BIOGEL PI IND STRL 9 (GLOVE) ×1 IMPLANT
GLOVE BIOGEL PI INDICATOR 9 (GLOVE) ×2
GLOVE SURG ORTHO 9.0 STRL STRW (GLOVE) ×3 IMPLANT
GOWN SPECIALTY ULTRA XL (MISCELLANEOUS) ×3 IMPLANT
GOWN STRL REUS W/ TWL LRG LVL3 (GOWN DISPOSABLE) ×1 IMPLANT
GOWN STRL REUS W/TWL LRG LVL3 (GOWN DISPOSABLE) ×2
IV LACTATED RINGER IRRG 3000ML (IV SOLUTION) ×8
IV LR IRRIG 3000ML ARTHROMATIC (IV SOLUTION) ×4 IMPLANT
KIT RM TURNOVER STRD PROC AR (KITS) ×3 IMPLANT
MANIFOLD NEPTUNE II (INSTRUMENTS) ×3 IMPLANT
PACK ARTHROSCOPY KNEE (MISCELLANEOUS) ×3 IMPLANT
SET TUBE SUCT SHAVER OUTFL 24K (TUBING) ×3 IMPLANT
SET TUBE TIP INTRA-ARTICULAR (MISCELLANEOUS) ×3 IMPLANT
SUT ETHILON 4-0 (SUTURE) ×2
SUT ETHILON 4-0 FS2 18XMFL BLK (SUTURE) ×1
SUTURE ETHLN 4-0 FS2 18XMF BLK (SUTURE) ×1 IMPLANT
TUBING ARTHRO INFLOW-ONLY STRL (TUBING) ×3 IMPLANT
WAND HAND CNTRL MULTIVAC 50 (MISCELLANEOUS) ×3 IMPLANT

## 2015-03-22 NOTE — Anesthesia Preprocedure Evaluation (Signed)
Anesthesia Evaluation  Patient identified by MRN, date of birth, ID band Patient awake    Reviewed: Allergy & Precautions, H&P , NPO status , Patient's Chart, lab work & pertinent test results  History of Anesthesia Complications Negative for: history of anesthetic complications  Airway Mallampati: III  TM Distance: >3 FB Neck ROM: full    Dental  (+) Poor Dentition, Upper Dentures, Lower Dentures, Missing   Pulmonary neg pulmonary ROS, neg shortness of breath,    Pulmonary exam normal breath sounds clear to auscultation       Cardiovascular Exercise Tolerance: Good hypertension, (-) angina(-) Past MI and (-) DOE Normal cardiovascular exam Rhythm:regular Rate:Normal     Neuro/Psych CVA negative psych ROS   GI/Hepatic Neg liver ROS, GERD  Controlled,  Endo/Other  diabetes, Well Controlled, Type 2, Insulin Dependent  Renal/GU negative Renal ROS  negative genitourinary   Musculoskeletal   Abdominal   Peds  Hematology negative hematology ROS (+)   Anesthesia Other Findings Past Medical History:   Diabetes mellitus without complication (HCC)                 Hypertension                                                 Hyperlipidemia                                               Stroke (HCC)                                    2016         GERD (gastroesophageal reflux disease)                      Past Surgical History:   VAGINAL HYSTERECTOMY                                          COLONOSCOPY                                                   UPPER GI ENDOSCOPY                                            TUBAL LIGATION                                               BMI    Body Mass Index   37.22 kg/m 2      Reproductive/Obstetrics negative OB ROS  Anesthesia Physical Anesthesia Plan  ASA: III  Anesthesia Plan: General LMA   Post-op Pain Management:     Induction:   Airway Management Planned:   Additional Equipment:   Intra-op Plan:   Post-operative Plan:   Informed Consent: I have reviewed the patients History and Physical, chart, labs and discussed the procedure including the risks, benefits and alternatives for the proposed anesthesia with the patient or authorized representative who has indicated his/her understanding and acceptance.   Dental Advisory Given  Plan Discussed with: Anesthesiologist, CRNA and Surgeon  Anesthesia Plan Comments:         Anesthesia Quick Evaluation

## 2015-03-22 NOTE — Anesthesia Postprocedure Evaluation (Signed)
Anesthesia Post Note  Patient: Ann Price  Procedure(s) Performed: Procedure(s) (LRB): ARTHROSCOPY KNEE, PARTIAL MEDIAL MENISECTOMY (Right)  Patient location during evaluation: PACU Anesthesia Type: General Level of consciousness: awake and alert Pain management: pain level controlled Vital Signs Assessment: post-procedure vital signs reviewed and stable Respiratory status: spontaneous breathing, nonlabored ventilation, respiratory function stable and patient connected to nasal cannula oxygen Cardiovascular status: blood pressure returned to baseline and stable Postop Assessment: no signs of nausea or vomiting Anesthetic complications: no    Last Vitals:  Filed Vitals:   03/22/15 1746 03/22/15 1800  BP: 145/68 138/60  Pulse: 55 57  Temp: 36.4 C   Resp: 14 14    Last Pain:  Filed Vitals:   03/22/15 1833  PainSc: 4                  Ann Price

## 2015-03-22 NOTE — Discharge Instructions (Addendum)
Weightbearing as tolerated on the right leg. If bandage slides down the leg remove entire bandage. Cover the 2 incisions with Band-Aids and rewrap the Ace wrap only. Otherwise keep bandage clean and dry until return visit.   AMBULATORY SURGERY  DISCHARGE INSTRUCTIONS   1) The drugs that you were given will stay in your system until tomorrow so for the next 24 hours you should not:  A) Drive an automobile B) Make any legal decisions C) Drink any alcoholic beverage   2) You may resume regular meals tomorrow.  Today it is better to start with liquids and gradually work up to solid foods.  You may eat anything you prefer, but it is better to start with liquids, then soup and crackers, and gradually work up to solid foods.   3) Please notify your doctor immediately if you have any unusual bleeding, trouble breathing, redness and pain at the surgery site, drainage, fever, or pain not relieved by medication.    4) Additional Instructions:        Please contact your physician with any problems or Same Day Surgery at [phone], Monday through Friday 6 am to 4 pm, or [place] at [facility] number at [phone].

## 2015-03-22 NOTE — Op Note (Signed)
03/22/2015  4:44 PM  PATIENT:  Ann Price  70 y.o. female  PRE-OPERATIVE DIAGNOSIS:  complex tear of medial meniscus   POST-OPERATIVE DIAGNOSIS:  complex tear of medial meniscus   PROCEDURE:  Procedure(s): ARTHROSCOPY KNEE, PARTIAL MEDIAL MENISECTOMY (Right)  SURGEON: Leitha Schuller, MD  ASSISTANTS: None  ANESTHESIA:   general  EBL:     BLOOD ADMINISTERED:none  DRAINS: none   LOCAL MEDICATIONS USED:  MARCAINE     SPECIMEN:  No Specimen  DISPOSITION OF SPECIMEN:  N/A  COUNTS:  YES  TOURNIQUET:    IMPLANTS:   DICTATION: .Dragon Dictation  patient brought the operating room and after adequate general anesthesia was obtained the right leg was placed in arthroscopic leg holder with a tourniquet applied. After prepping and draping the sterile fashion, appropriate patient identification and timeout procedure completed. Inferolateral portal was made and the scope introduced showing mild patellofemoral degenerative change and mild synovitis the gutters were free of any loose bodies. Going to the medial compartment inferior medial portal was made and a probe introduced there is some fissuring and degenerative changes to various areas in the medial femoral compartment as well as superficial loss of the articular cartilage in the tibial side some fissuring and chondromalacia but no exposed bone on probing the posterior horn of the medial meniscus had a tear. This was debrided with meniscal punch and ArthriCare wand to a stable margin anterior cruciate ligament is intact there is a small amount synovium impinging laterally this was removed for visualization and the meniscus appeared intact with some mild chondromalacia of the femoral condyle. There was significant softening of the lateral tibial condyle but again no fissuring and a superficial cartilage loss. After thorough irrigation of the knee argentation was withdrawn. Wounds closed with 4-0 nylon. 20 cc half percent Sensorcaine with  epinephrine infiltrated into the portals were postop analgesia. Xeroform 4 x 4's web roll and Ace wrap applied  PLAN OF CARE: Discharge to home after PACU  PATIENT DISPOSITION:  PACU - hemodynamically stable.

## 2015-03-22 NOTE — H&P (Signed)
Reviewed paper H+P, will be scanned into chart. No changes noted.  

## 2015-03-22 NOTE — Transfer of Care (Signed)
Immediate Anesthesia Transfer of Care Note  Patient: Ann Price  Procedure(s) Performed: Procedure(s): ARTHROSCOPY KNEE, PARTIAL MEDIAL MENISECTOMY (Right)  Patient Location: PACU  Anesthesia Type:General  Level of Consciousness: awake, alert  and oriented  Airway & Oxygen Therapy: Patient Spontanous Breathing  Post-op Assessment: Report given to RN and Post -op Vital signs reviewed and stable  Post vital signs: Reviewed and stable  Last Vitals:  Filed Vitals:   03/22/15 1329 03/22/15 1645  BP: 137/61 135/62  Pulse: 59 73  Temp: 36.8 C 36.1 C  Resp: 16 18    Complications: No apparent anesthesia complications

## 2015-03-22 NOTE — Anesthesia Procedure Notes (Signed)
Procedure Name: Intubation Date/Time: 03/22/2015 3:51 PM Performed by: Rosaria Ferries Pre-anesthesia Checklist: Timeout performed, Patient being monitored, Suction available, Emergency Drugs available and Patient identified Patient Re-evaluated:Patient Re-evaluated prior to inductionOxygen Delivery Method: Circle system utilized Preoxygenation: Pre-oxygenation with 100% oxygen Intubation Type: IV induction Number of attempts: 1 Placement Confirmation: ETT inserted through vocal cords under direct vision,  positive ETCO2 and breath sounds checked- equal and bilateral Tube secured with: Tape Dental Injury: Teeth and Oropharynx as per pre-operative assessment

## 2015-03-23 ENCOUNTER — Encounter: Payer: Self-pay | Admitting: Orthopedic Surgery

## 2015-07-03 DIAGNOSIS — E1165 Type 2 diabetes mellitus with hyperglycemia: Secondary | ICD-10-CM | POA: Diagnosis not present

## 2015-07-03 DIAGNOSIS — Z794 Long term (current) use of insulin: Secondary | ICD-10-CM | POA: Diagnosis not present

## 2015-07-03 DIAGNOSIS — N183 Chronic kidney disease, stage 3 (moderate): Secondary | ICD-10-CM | POA: Diagnosis not present

## 2015-07-03 DIAGNOSIS — E1122 Type 2 diabetes mellitus with diabetic chronic kidney disease: Secondary | ICD-10-CM | POA: Diagnosis not present

## 2015-07-10 DIAGNOSIS — I10 Essential (primary) hypertension: Secondary | ICD-10-CM | POA: Diagnosis not present

## 2015-07-10 DIAGNOSIS — E1165 Type 2 diabetes mellitus with hyperglycemia: Secondary | ICD-10-CM | POA: Diagnosis not present

## 2015-07-10 DIAGNOSIS — E1122 Type 2 diabetes mellitus with diabetic chronic kidney disease: Secondary | ICD-10-CM | POA: Diagnosis not present

## 2015-07-10 DIAGNOSIS — M5416 Radiculopathy, lumbar region: Secondary | ICD-10-CM | POA: Diagnosis not present

## 2015-07-10 DIAGNOSIS — N183 Chronic kidney disease, stage 3 (moderate): Secondary | ICD-10-CM | POA: Diagnosis not present

## 2015-07-10 DIAGNOSIS — Z794 Long term (current) use of insulin: Secondary | ICD-10-CM | POA: Diagnosis not present

## 2015-11-11 IMAGING — MG MM DIGITAL SCREENING BILAT W/ TOMO W/ CAD
12 of 16 series · 12 of 32 positions shown · non-contrast
Comparison: Previous exam(s).

CLINICAL DATA: Screening.

EXAM:
DIGITAL SCREENING BILATERAL MAMMOGRAM WITH 3D TOMO WITH CAD

[L CC synth-2D]
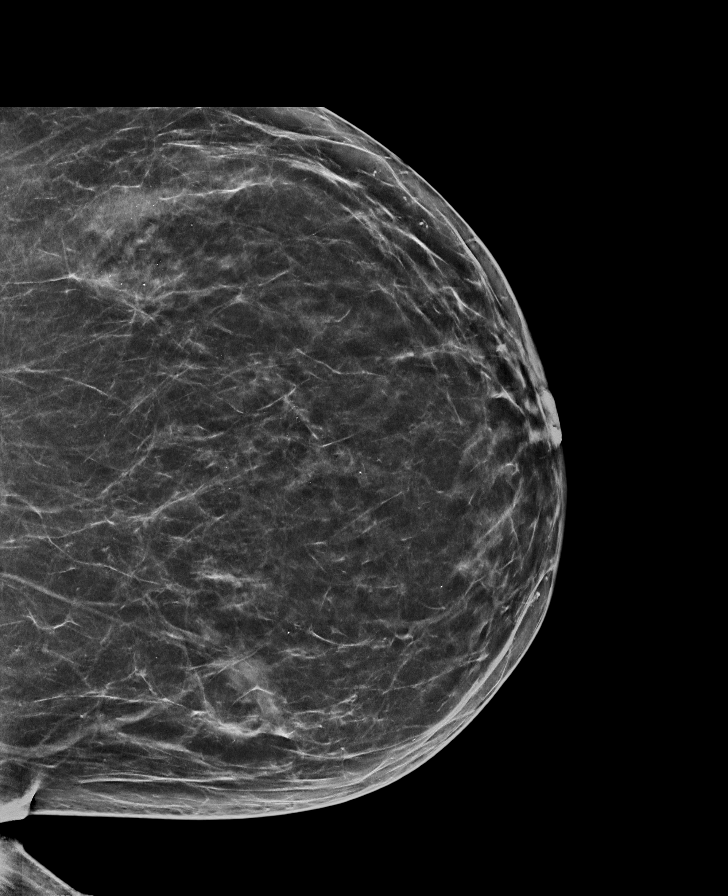

[L MLO]
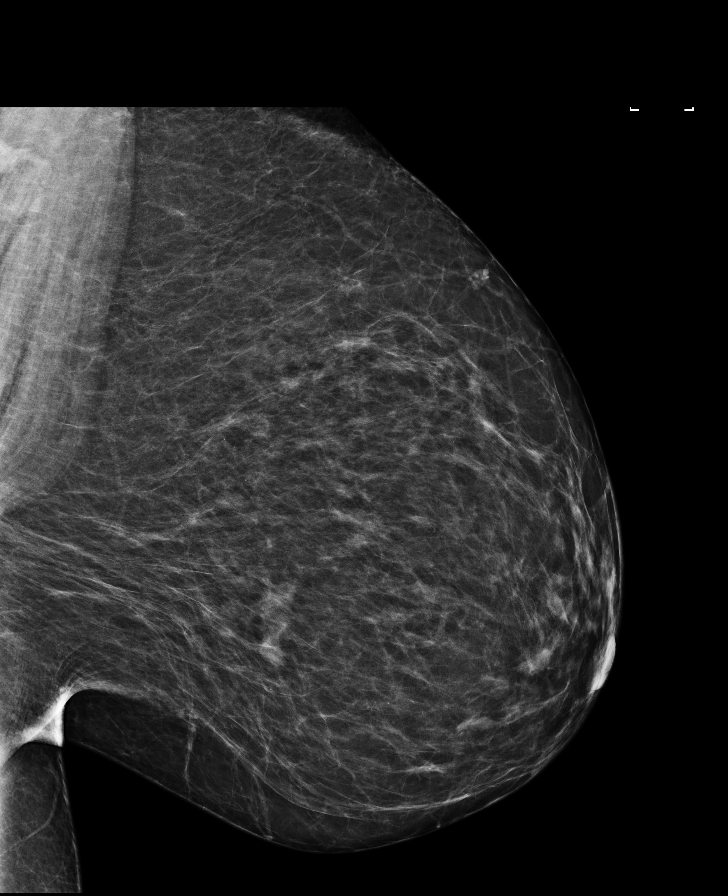

[L CC (1 of 2)]
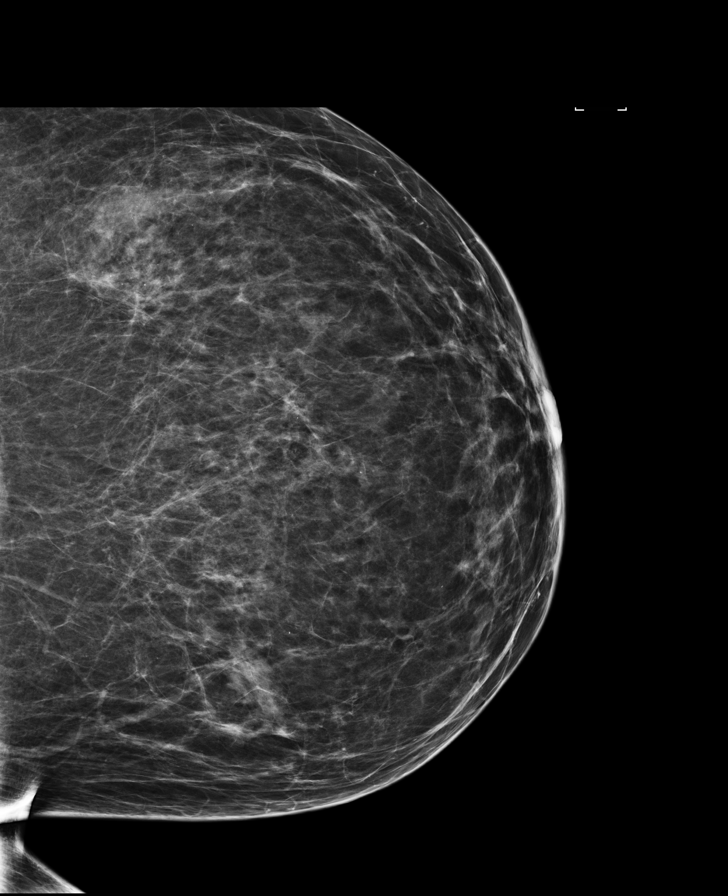

[R CC]
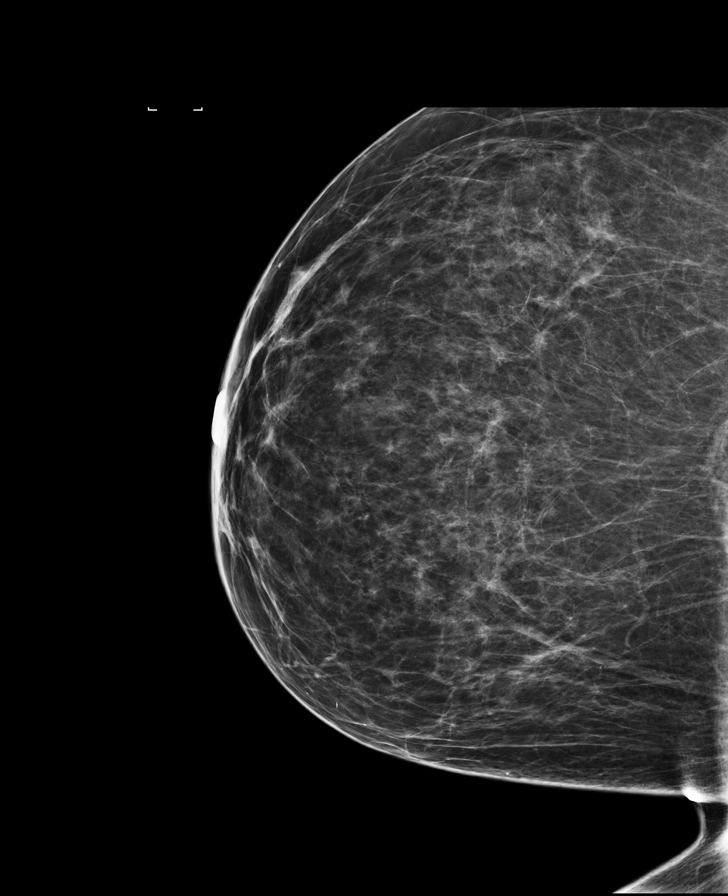

[R CC synth-2D]
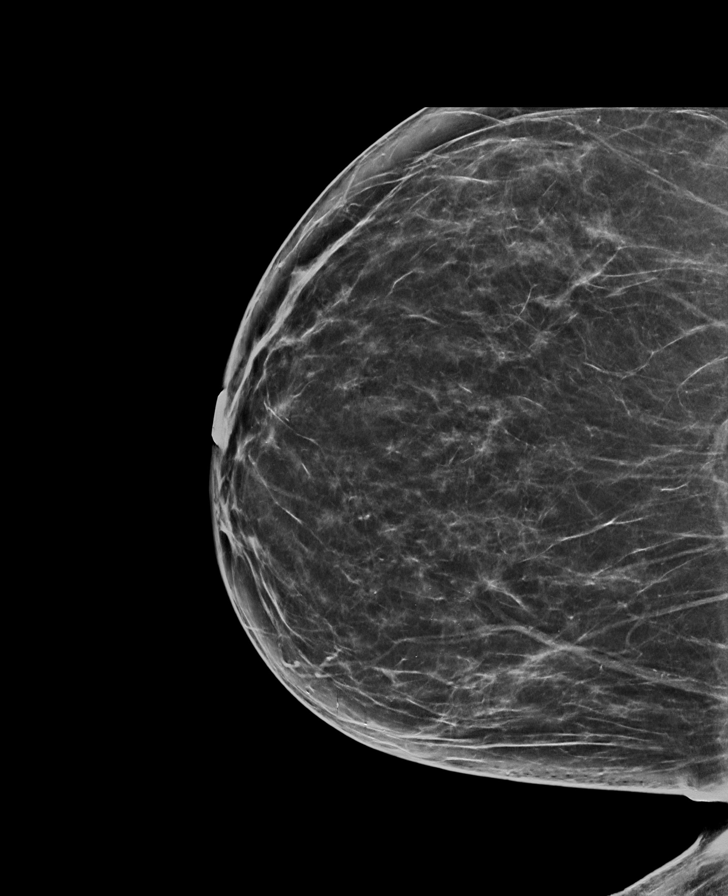

[R MLO]
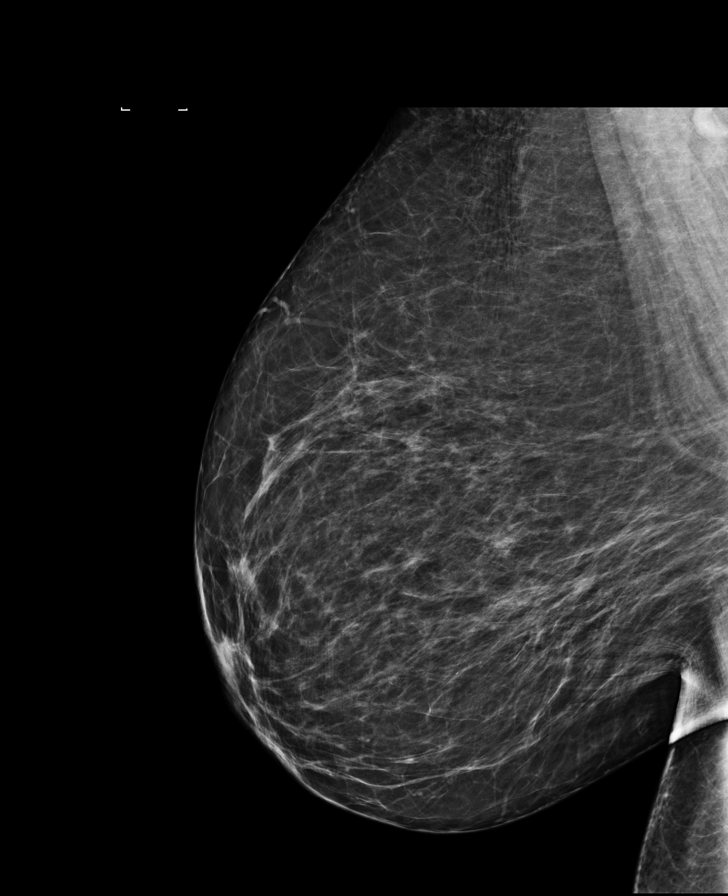

[R MLO synth-2D]
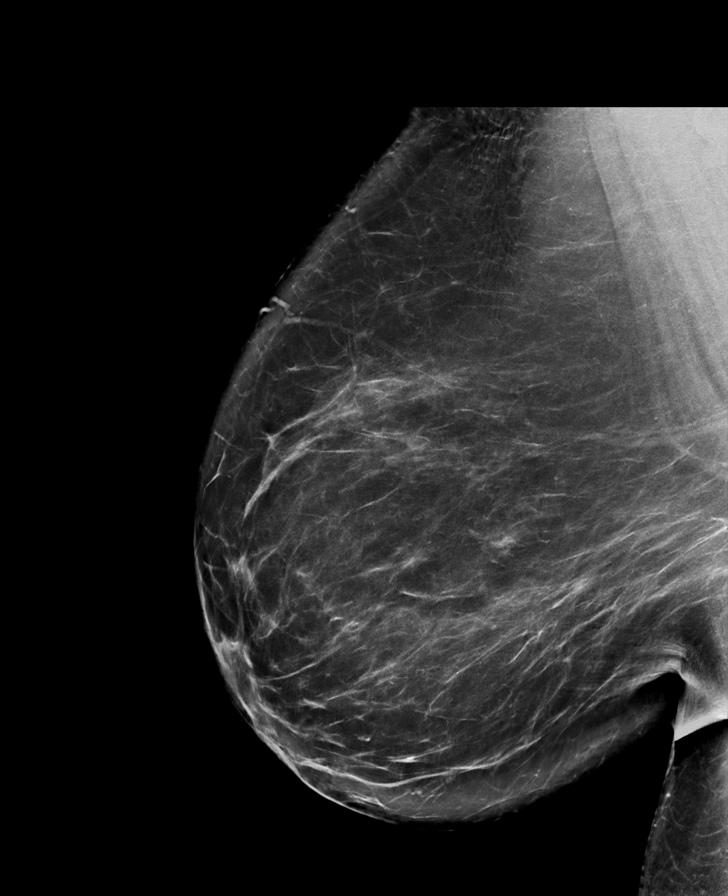

[R CC tomo]
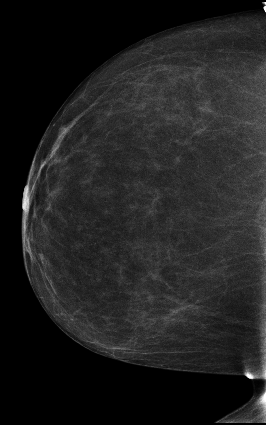

[L MLO tomo]
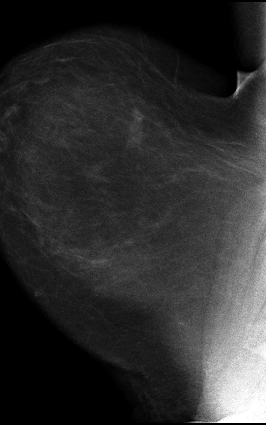

[R MLO tomo]
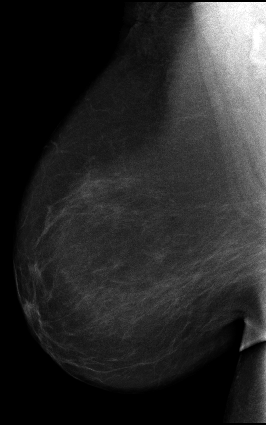

[L CC tomo]
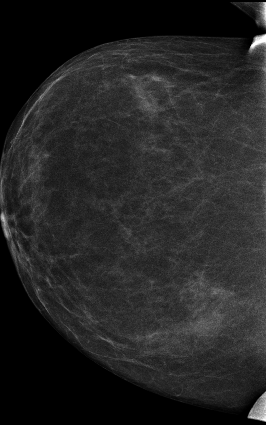

[L CC (2 of 2)]
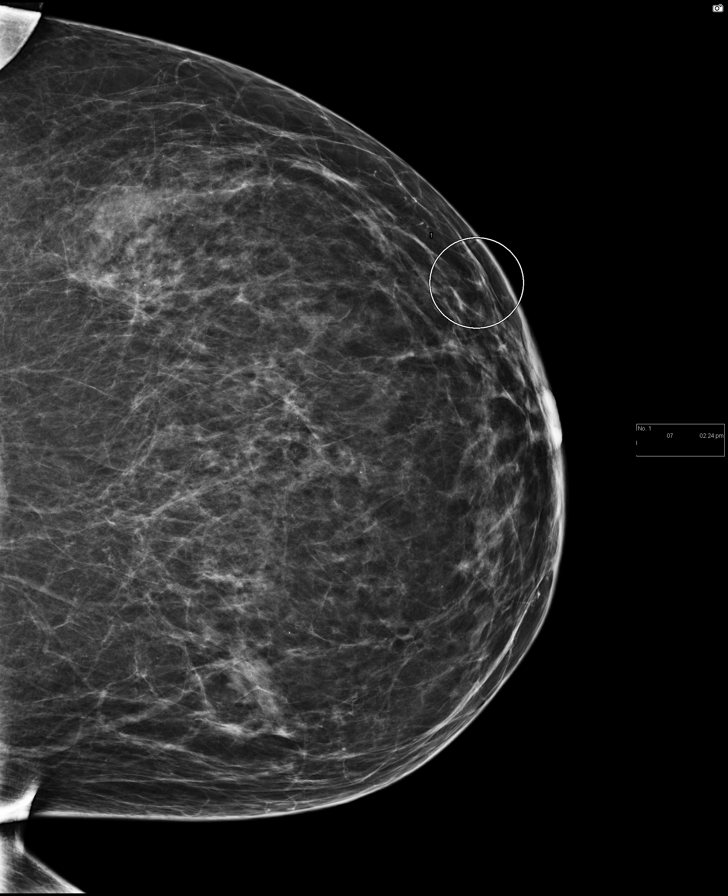

[12 of 32 positions shown; findings below may reference images not displayed]

ACR Breast Density Category c: The breast tissue is heterogeneously
dense, which may obscure small masses.
FINDINGS: In the left breast, calcifications warrant further evaluation. In
the right breast, no findings suspicious for malignancy. Images were
processed with CAD.
IMPRESSION: Further evaluation is suggested for calcifications in the left
breast.

RECOMMENDATION:
Diagnostic mammogram of the left breast. (Code:[id number])

The patient will be contacted regarding the findings, and additional
imaging will be scheduled.

BI-RADS CATEGORY  0: Incomplete. Need additional imaging evaluation
and/or prior mammograms for comparison.

## 2015-11-13 DIAGNOSIS — Z0000 Encounter for general adult medical examination without abnormal findings: Secondary | ICD-10-CM | POA: Diagnosis not present

## 2015-11-13 DIAGNOSIS — Z23 Encounter for immunization: Secondary | ICD-10-CM | POA: Diagnosis not present

## 2015-11-13 DIAGNOSIS — Z1231 Encounter for screening mammogram for malignant neoplasm of breast: Secondary | ICD-10-CM | POA: Diagnosis not present

## 2015-11-13 DIAGNOSIS — E1165 Type 2 diabetes mellitus with hyperglycemia: Secondary | ICD-10-CM | POA: Diagnosis not present

## 2015-11-13 DIAGNOSIS — E1122 Type 2 diabetes mellitus with diabetic chronic kidney disease: Secondary | ICD-10-CM | POA: Diagnosis not present

## 2015-11-13 DIAGNOSIS — N183 Chronic kidney disease, stage 3 (moderate): Secondary | ICD-10-CM | POA: Diagnosis not present

## 2015-11-13 DIAGNOSIS — Z794 Long term (current) use of insulin: Secondary | ICD-10-CM | POA: Diagnosis not present

## 2016-01-07 DIAGNOSIS — E119 Type 2 diabetes mellitus without complications: Secondary | ICD-10-CM | POA: Diagnosis not present

## 2016-03-07 DIAGNOSIS — E119 Type 2 diabetes mellitus without complications: Secondary | ICD-10-CM | POA: Diagnosis not present

## 2016-03-17 DIAGNOSIS — H2513 Age-related nuclear cataract, bilateral: Secondary | ICD-10-CM | POA: Diagnosis not present

## 2016-03-18 ENCOUNTER — Encounter: Payer: Self-pay | Admitting: *Deleted

## 2016-03-20 NOTE — Discharge Instructions (Signed)
Cataract Surgery, Care After °Refer to this sheet in the next few weeks. These instructions provide you with information about caring for yourself after your procedure. Your health care provider may also give you more specific instructions. Your treatment has been planned according to current medical practices, but problems sometimes occur. Call your health care provider if you have any problems or questions after your procedure. °What can I expect after the procedure? °After the procedure, it is common to have: °· Itching. °· Discomfort. °· Fluid discharge. °· Sensitivity to light and to touch. °· Bruising. °Follow these instructions at home: °Eye Care  °· Check your eye every day for signs of infection. Watch for: °¨ Redness, swelling, or pain. °¨ Fluid, blood, or pus. °¨ Warmth. °¨ Bad smell. °Activity  °· Avoid strenuous activities, such as playing contact sports, for as long as told by your health care provider. °· Do not drive or operate heavy machinery until your health care provider approves. °· Do not bend or lift heavy objects . Bending increases pressure in the eye. You can walk, climb stairs, and do light household chores. °· Ask your health care provider when you can return to work. If you work in a dusty environment, you may be advised to wear protective eyewear for a period of time. °General instructions  °· Take or apply over-the-counter and prescription medicines only as told by your health care provider. This includes eye drops. °· Do not touch or rub your eyes. °· If you were given a protective shield, wear it as told by your health care provider. If you were not given a protective shield, wear sunglasses as told by your health care provider to protect your eyes. °· Keep the area around your eye clean and dry. Avoid swimming or allowing water to hit you directly in the face while showering until told by your health care provider. Keep soap and shampoo out of your eyes. °· Do not put a contact lens  into the affected eye or eyes until your health care provider approves. °· Keep all follow-up visits as told by your health care provider. This is important. °Contact a health care provider if: ° °· You have increased bruising around your eye. °· You have pain that is not helped with medicine. °· You have a fever. °· You have redness, swelling, or pain in your eye. °· You have fluid, blood, or pus coming from your incision. °· Your vision gets worse. °Get help right away if: °· You have sudden vision loss. °This information is not intended to replace advice given to you by your health care provider. Make sure you discuss any questions you have with your health care provider. °Document Released: 08/02/2004 Document Revised: 05/24/2015 Document Reviewed: 11/23/2014 °Elsevier Interactive Patient Education © 2017 [employer] ° ° ° ° °General Anesthesia, Adult, Care After °These instructions provide you with information about caring for yourself after your procedure. Your health care provider may also give you more specific instructions. Your treatment has been planned according to current medical practices, but problems sometimes occur. Call your health care provider if you have any problems or questions after your procedure. °What can I expect after the procedure? °After the procedure, it is common to have: °· Vomiting. °· A sore throat. °· Mental slowness. °It is common to feel: °· Nauseous. °· Cold or shivery. °· Sleepy. °· Tired. °· Sore or achy, even in parts of your body where you did not have surgery. °Follow these instructions at   home: °For at least 24 hours after the procedure:  °· Do not: °¨ Participate in activities where you could fall or become injured. °¨ Drive. °¨ Use heavy machinery. °¨ Drink alcohol. °¨ Take sleeping pills or medicines that cause drowsiness. °¨ Make important decisions or sign legal documents. °¨ Take care of children on your own. °· Rest. °Eating and drinking  °· If you vomit, drink  water, juice, or soup when you can drink without vomiting. °· Drink enough fluid to keep your urine clear or pale yellow. °· Make sure you have little or no nausea before eating solid foods. °· Follow the diet recommended by your health care provider. °General instructions  °· Have a responsible adult stay with you until you are awake and alert. °· Return to your normal activities as told by your health care provider. Ask your health care provider what activities are safe for you. °· Take over-the-counter and prescription medicines only as told by your health care provider. °· If you smoke, do not smoke without supervision. °· Keep all follow-up visits as told by your health care provider. This is important. °Contact a health care provider if: °· You continue to have nausea or vomiting at home, and medicines are not helpful. °· You cannot drink fluids or start eating again. °· You cannot urinate after 8-12 hours. °· You develop a skin rash. °· You have fever. °· You have increasing redness at the site of your procedure. °Get help right away if: °· You have difficulty breathing. °· You have chest pain. °· You have unexpected bleeding. °· You feel that you are having a life-threatening or urgent problem. °This information is not intended to replace advice given to you by your health care provider. Make sure you discuss any questions you have with your health care provider. °Document Released: 04/21/2000 Document Revised: 06/18/2015 Document Reviewed: 12/28/2014 °Elsevier Interactive Patient Education © 2017 [employer] ° °

## 2016-03-24 NOTE — Anesthesia Preprocedure Evaluation (Addendum)
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;  last plavix 3 days ago;), No Residual Symptoms negative psych ROS   GI/Hepatic Neg liver ROS, GERD  Controlled,  Endo/Other  diabetes  Renal/GU CRFRenal disease (ckd3)  negative genitourinary   Musculoskeletal  (+) Arthritis ,   Abdominal   Peds  Hematology negative hematology ROS (+)   Anesthesia Other Findings   Reproductive/Obstetrics                            Anesthesia Physical Anesthesia Plan  ASA: II  Anesthesia Plan: MAC   Post-op Pain Management:    Induction:   Airway Management Planned:   Additional Equipment:   Intra-op Plan:   Post-operative Plan:   Informed Consent: I have reviewed the patients History and Physical, chart, labs and discussed the procedure including the risks, benefits and alternatives for the proposed anesthesia with the patient or authorized representative who has indicated his/her understanding and acceptance.     Plan Discussed with: CRNA  Anesthesia Plan Comments:         Anesthesia Quick Evaluation

## 2016-03-25 ENCOUNTER — Ambulatory Visit: Payer: Medicare HMO | Admitting: Student in an Organized Health Care Education/Training Program

## 2016-03-25 ENCOUNTER — Encounter
Admission: RE | Disposition: A | Discharge status: Home / Self Care | Payer: Self-pay | Source: Ambulatory Visit | Attending: Ophthalmology

## 2016-03-25 ENCOUNTER — Ambulatory Visit
Admission: RE | Admit: 2016-03-25 | Discharge: 2016-03-25 | Disposition: A | Discharge status: Home / Self Care | Payer: Medicare HMO | Source: Ambulatory Visit | Attending: Ophthalmology | Admitting: Ophthalmology

## 2016-03-25 DIAGNOSIS — I129 Hypertensive chronic kidney disease with stage 1 through stage 4 chronic kidney disease, or unspecified chronic kidney disease: Secondary | ICD-10-CM | POA: Diagnosis not present

## 2016-03-25 DIAGNOSIS — Z7902 Long term (current) use of antithrombotics/antiplatelets: Secondary | ICD-10-CM | POA: Diagnosis not present

## 2016-03-25 DIAGNOSIS — N183 Chronic kidney disease, stage 3 (moderate): Secondary | ICD-10-CM | POA: Diagnosis not present

## 2016-03-25 DIAGNOSIS — H2181 Floppy iris syndrome: Secondary | ICD-10-CM | POA: Insufficient documentation

## 2016-03-25 DIAGNOSIS — M1990 Unspecified osteoarthritis, unspecified site: Secondary | ICD-10-CM | POA: Insufficient documentation

## 2016-03-25 DIAGNOSIS — H2511 Age-related nuclear cataract, right eye: Secondary | ICD-10-CM | POA: Diagnosis not present

## 2016-03-25 DIAGNOSIS — E1165 Type 2 diabetes mellitus with hyperglycemia: Secondary | ICD-10-CM | POA: Diagnosis not present

## 2016-03-25 DIAGNOSIS — K219 Gastro-esophageal reflux disease without esophagitis: Secondary | ICD-10-CM | POA: Diagnosis not present

## 2016-03-25 DIAGNOSIS — H2513 Age-related nuclear cataract, bilateral: Secondary | ICD-10-CM | POA: Diagnosis not present

## 2016-03-25 DIAGNOSIS — E1122 Type 2 diabetes mellitus with diabetic chronic kidney disease: Secondary | ICD-10-CM | POA: Diagnosis not present

## 2016-03-25 HISTORY — DX: Unspecified osteoarthritis, unspecified site: M19.90

## 2016-03-25 HISTORY — PX: CATARACT EXTRACTION W/PHACO: SHX586

## 2016-03-25 HISTORY — DX: Presence of dental prosthetic device (complete) (partial): Z97.2

## 2016-03-25 LAB — GLUCOSE, CAPILLARY
GLUCOSE-CAPILLARY: 320 mg/dL — AB (ref 65–99)
Glucose-Capillary: 320 mg/dL — ABNORMAL HIGH (ref 65–99)

## 2016-03-25 SURGERY — PHACOEMULSIFICATION, CATARACT, WITH IOL INSERTION
Anesthesia: Monitor Anesthesia Care | Laterality: Right | Wound class: Clean

## 2016-03-25 MED ORDER — LIDOCAINE HCL (PF) 4 % IJ SOLN
INTRAOCULAR | Status: DC | PRN
  Administered 2016-03-25: 1 mL via OPHTHALMIC

## 2016-03-25 MED ORDER — MIDAZOLAM HCL 2 MG/2ML IJ SOLN
INTRAMUSCULAR | Status: DC | PRN
  Administered 2016-03-25: 1 mg via INTRAVENOUS

## 2016-03-25 MED ORDER — EPINEPHRINE PF 1 MG/ML IJ SOLN
INTRAOCULAR | Status: DC | PRN
  Administered 2016-03-25: 65 mL via OPHTHALMIC

## 2016-03-25 MED ORDER — FENTANYL CITRATE (PF) 100 MCG/2ML IJ SOLN
INTRAMUSCULAR | Status: DC | PRN
  Administered 2016-03-25: 50 ug via INTRAVENOUS

## 2016-03-25 MED ORDER — ARMC OPHTHALMIC DILATING DROPS
1.0000 "application " | OPHTHALMIC | Status: DC | PRN
  Administered 2016-03-25 (×3): 1 via OPHTHALMIC

## 2016-03-25 MED ORDER — OXYCODONE HCL 5 MG PO TABS: 5.0000 mg | ORAL_TABLET | Freq: Once | ORAL | Status: DC | PRN

## 2016-03-25 MED ORDER — MOXIFLOXACIN HCL 0.5 % OP SOLN
OPHTHALMIC | Status: DC | PRN
  Administered 2016-03-25: 0.2 mL via OPHTHALMIC

## 2016-03-25 MED ORDER — SODIUM HYALURONATE 10 MG/ML IO SOLN
INTRAOCULAR | Status: DC | PRN
  Administered 2016-03-25: 10 mg via INTRAOCULAR

## 2016-03-25 MED ORDER — OXYCODONE HCL 5 MG/5ML PO SOLN: 5.0000 mg | Freq: Once | ORAL | Status: DC | PRN

## 2016-03-25 MED ORDER — LACTATED RINGERS IV SOLN: INTRAVENOUS | Status: DC

## 2016-03-25 MED ORDER — SODIUM HYALURONATE 23 MG/ML IO SOLN
INTRAOCULAR | Status: DC | PRN
  Administered 2016-03-25: 0.6 mL via INTRAOCULAR

## 2016-03-25 MED ORDER — INSULIN REGULAR HUMAN 100 UNIT/ML IJ SOLN
3.0000 [IU] | Freq: Once | INTRAMUSCULAR | Status: AC
  Administered 2016-03-25: 3 [IU] via SUBCUTANEOUS

## 2016-03-25 MED ORDER — MOXIFLOXACIN HCL 0.5 % OP SOLN: 1.0000 [drp] | OPHTHALMIC | Status: DC | PRN

## 2016-03-25 SURGICAL SUPPLY — 17 items
CANNULA ANT/CHMB 27GA (MISCELLANEOUS) ×2 IMPLANT
CUP MEDICINE 2OZ PLAST GRAD ST (MISCELLANEOUS) ×2 IMPLANT
DISSECTOR HYDRO NUCLEUS 50X22 (MISCELLANEOUS) ×2 IMPLANT
GLOVE BIO SURGEON STRL SZ8 (GLOVE) ×2 IMPLANT
GLOVE SURG LX 7.5 STRW (GLOVE) ×1
GLOVE SURG LX STRL 7.5 STRW (GLOVE) ×1 IMPLANT
GOWN STRL REUS W/ TWL LRG LVL3 (GOWN DISPOSABLE) ×2 IMPLANT
GOWN STRL REUS W/TWL LRG LVL3 (GOWN DISPOSABLE) ×2
LENS IOL TECNIS ITEC 25.5 (Intraocular Lens) ×2 IMPLANT
MARKER SKIN DUAL TIP RULER LAB (MISCELLANEOUS) ×2 IMPLANT
PACK CATARACT (MISCELLANEOUS) ×2 IMPLANT
PACK CATARACT BRASINGTON (MISCELLANEOUS) ×2 IMPLANT
PACK EYE AFTER SURG (MISCELLANEOUS) ×2 IMPLANT
SYR 3ML LL SCALE MARK (SYRINGE) ×2 IMPLANT
SYR TB 1ML LUER SLIP (SYRINGE) ×2 IMPLANT
WATER STERILE IRR 250ML POUR (IV SOLUTION) ×2 IMPLANT
WIPE NON LINTING 3.25X3.25 (MISCELLANEOUS) ×2 IMPLANT

## 2016-03-25 NOTE — H&P (Signed)
The History and Physical notes are on paper, have been signed, and are to be scanned.   I have examined the patient and there are no changes to the H&P.   Willey BladeBradley Mikalia Fessel 03/25/2016 7:27 AM

## 2016-03-25 NOTE — Anesthesia Postprocedure Evaluation (Addendum)
Anesthesia Post Note  Patient: Ann Price  Procedure(s) Performed: Procedure(s) (LRB): CATARACT EXTRACTION PHACO AND INTRAOCULAR LENS PLACEMENT (IOC)  right diabetic (Right)  Patient location during evaluation: PACU Anesthesia Type: MAC Level of consciousness: awake and alert Pain management: pain level controlled Vital Signs Assessment: post-procedure vital signs reviewed and stable Respiratory status: spontaneous breathing, nonlabored ventilation, respiratory function stable and patient connected to nasal cannula oxygen Cardiovascular status: stable and blood pressure returned to baseline Anesthetic complications: no    Brinlee Gambrell  Pt to resume home DM meds.

## 2016-03-25 NOTE — Progress Notes (Signed)
Dr. Marry GuanMaji aware of blood glucose.  No further orders at this time.  Post-op instructions reviewed, all questions answered.

## 2016-03-25 NOTE — Anesthesia Procedure Notes (Signed)
Procedure Name: MAC Performed by: Shaunette Gassner Pre-anesthesia Checklist: Patient identified, Emergency Drugs available, Suction available, Patient being monitored and Timeout performed Patient Re-evaluated:Patient Re-evaluated prior to inductionOxygen Delivery Method: Nasal cannula Preoxygenation: Pre-oxygenation with 100% oxygen       

## 2016-03-25 NOTE — Op Note (Signed)
OPERATIVE NOTE  Ann OddiBarbara L Price [id number] 03/25/2016   PREOPERATIVE DIAGNOSIS:  Nuclear sclerotic cataract right eye.  H25.11   POSTOPERATIVE DIAGNOSIS:     1.  Nuclear sclerotic cataract right eye.   2.  Intraoperative floppy iris syndrome. 3.  Poorly controlled DM.   PROCEDURE:  Phacoemusification with posterior chamber intraocular lens placement of the right eye   LENS:   Implant Name Type Inv. Item Serial No. Manufacturer Lot No. LRB No. Used  LENS IOL DIOP 25.5 - [id number] Intraocular Lens LENS IOL DIOP 25.5 [id number] AMO   Right 1       PCB00 +25.5   ULTRASOUND TIME: 0 minutes 46 seconds.  CDE 8.15   SURGEON:  Willey BladeBradley Kennis Buell, MD, MPH  ANESTHESIOLOGIST: Anesthesiologist: Orrin Brighamebabrata Maji, MD CRNA: Orlin HildingMonique Leblanc, CRNA   ANESTHESIA:  Topical with tetracaine drops augmented with 1% preservative-free intracameral lidocaine.  ESTIMATED BLOOD LOSS: less than 1 mL.   COMPLICATIONS:  None.   DESCRIPTION OF PROCEDURE:  The patient was identified in the holding room and transported to the operating room and placed in the supine position under the operating microscope.  The right eye was identified as the operative eye and it was prepped and draped in the usual sterile ophthalmic fashion.   A 1.0 millimeter clear-corneal paracentesis was made at the 10:30 position. 0.5 ml of preservative-free 1% lidocaine with epinephrine was injected into the anterior chamber.  The anterior chamber was filled with Healon 5 viscoelastic.  A 2.4 millimeter keratome was used to make a near-clear corneal incision at the 8:00 position.  A curvilinear capsulorrhexis was made with a cystotome and capsulorrhexis forceps.  Balanced salt solution was used to hydrodissect and hydrodelineate the nucleus.   Phacoemulsification was then used in stop and chop fashion to remove the lens nucleus and epinucleus.  The remaining cortex was then removed using the irrigation and aspiration handpiece. Healon was then  placed into the capsular bag to distend it for lens placement.  A lens was then injected into the capsular bag.  The remaining viscoelastic was aspirated.   Wounds were hydrated with balanced salt solution.  The anterior chamber was inflated to a physiologic pressure with balanced salt solution.   Intracameral vigamox 0.1 mL undiluted was injected into the eye and a drop placed onto the ocular surface.  No wound leaks were noted.  The patient was taken to the recovery room in stable condition without complications of anesthesia or surgery  Willey BladeBradley Holy Battenfield 03/25/2016, 8:16 AM

## 2016-03-25 NOTE — Transfer of Care (Signed)
Immediate Anesthesia Transfer of Care Note  Patient: Ann Price  Procedure(s) Performed: Procedure(s) with comments: CATARACT EXTRACTION PHACO AND INTRAOCULAR LENS PLACEMENT (IOC)  right diabetic (Right) - diabetic - oral meds  Patient Location: PACU  Anesthesia Type: MAC  Level of Consciousness: awake, alert  and patient cooperative  Airway and Oxygen Therapy: Patient Spontanous Breathing and Patient connected to supplemental oxygen  Post-op Assessment: Post-op Vital signs reviewed, Patient's Cardiovascular Status Stable, Respiratory Function Stable, Patent Airway and No signs of Nausea or vomiting  Post-op Vital Signs: Reviewed and stable  Complications: No apparent anesthesia complications

## 2016-03-25 NOTE — Addendum Note (Signed)
Addendum  created 03/25/16 0827 by Orrin Brighamebabrata Latorya Bautch, MD   Sign clinical note

## 2016-03-26 ENCOUNTER — Encounter: Payer: Self-pay | Admitting: Ophthalmology

## 2016-05-20 DIAGNOSIS — H2512 Age-related nuclear cataract, left eye: Secondary | ICD-10-CM | POA: Diagnosis not present

## 2016-05-28 ENCOUNTER — Encounter: Payer: Self-pay | Admitting: *Deleted

## 2016-05-29 NOTE — Discharge Instructions (Signed)
Cataract Surgery, Care After °Refer to this sheet in the next few weeks. These instructions provide you with information about caring for yourself after your procedure. Your health care provider may also give you more specific instructions. Your treatment has been planned according to current medical practices, but problems sometimes occur. Call your health care provider if you have any problems or questions after your procedure. °What can I expect after the procedure? °After the procedure, it is common to have: °· Itching. °· Discomfort. °· Fluid discharge. °· Sensitivity to light and to touch. °· Bruising. °Follow these instructions at home: °Eye Care  °· Check your eye every day for signs of infection. Watch for: °¨ Redness, swelling, or pain. °¨ Fluid, blood, or pus. °¨ Warmth. °¨ Bad smell. °Activity  °· Avoid strenuous activities, such as playing contact sports, for as long as told by your health care provider. °· Do not drive or operate heavy machinery until your health care provider approves. °· Do not bend or lift heavy objects . Bending increases pressure in the eye. You can walk, climb stairs, and do light household chores. °· Ask your health care provider when you can return to work. If you work in a dusty environment, you may be advised to wear protective eyewear for a period of time. °General instructions  °· Take or apply over-the-counter and prescription medicines only as told by your health care provider. This includes eye drops. °· Do not touch or rub your eyes. °· If you were given a protective shield, wear it as told by your health care provider. If you were not given a protective shield, wear sunglasses as told by your health care provider to protect your eyes. °· Keep the area around your eye clean and dry. Avoid swimming or allowing water to hit you directly in the face while showering until told by your health care provider. Keep soap and shampoo out of your eyes. °· Do not put a contact lens  into the affected eye or eyes until your health care provider approves. °· Keep all follow-up visits as told by your health care provider. This is important. °Contact a health care provider if: ° °· You have increased bruising around your eye. °· You have pain that is not helped with medicine. °· You have a fever. °· You have redness, swelling, or pain in your eye. °· You have fluid, blood, or pus coming from your incision. °· Your vision gets worse. °Get help right away if: °· You have sudden vision loss. °This information is not intended to replace advice given to you by your health care provider. Make sure you discuss any questions you have with your health care provider. °Document Released: 08/02/2004 Document Revised: 05/24/2015 Document Reviewed: 11/23/2014 °Elsevier Interactive Patient Education © 2017 [employer] ° ° ° ° °General Anesthesia, Adult, Care After °These instructions provide you with information about caring for yourself after your procedure. Your health care provider may also give you more specific instructions. Your treatment has been planned according to current medical practices, but problems sometimes occur. Call your health care provider if you have any problems or questions after your procedure. °What can I expect after the procedure? °After the procedure, it is common to have: °· Vomiting. °· A sore throat. °· Mental slowness. °It is common to feel: °· Nauseous. °· Cold or shivery. °· Sleepy. °· Tired. °· Sore or achy, even in parts of your body where you did not have surgery. °Follow these instructions at   home: °For at least 24 hours after the procedure:  °· Do not: °¨ Participate in activities where you could fall or become injured. °¨ Drive. °¨ Use heavy machinery. °¨ Drink alcohol. °¨ Take sleeping pills or medicines that cause drowsiness. °¨ Make important decisions or sign legal documents. °¨ Take care of children on your own. °· Rest. °Eating and drinking  °· If you vomit, drink  water, juice, or soup when you can drink without vomiting. °· Drink enough fluid to keep your urine clear or pale yellow. °· Make sure you have little or no nausea before eating solid foods. °· Follow the diet recommended by your health care provider. °General instructions  °· Have a responsible adult stay with you until you are awake and alert. °· Return to your normal activities as told by your health care provider. Ask your health care provider what activities are safe for you. °· Take over-the-counter and prescription medicines only as told by your health care provider. °· If you smoke, do not smoke without supervision. °· Keep all follow-up visits as told by your health care provider. This is important. °Contact a health care provider if: °· You continue to have nausea or vomiting at home, and medicines are not helpful. °· You cannot drink fluids or start eating again. °· You cannot urinate after 8-12 hours. °· You develop a skin rash. °· You have fever. °· You have increasing redness at the site of your procedure. °Get help right away if: °· You have difficulty breathing. °· You have chest pain. °· You have unexpected bleeding. °· You feel that you are having a life-threatening or urgent problem. °This information is not intended to replace advice given to you by your health care provider. Make sure you discuss any questions you have with your health care provider. °Document Released: 04/21/2000 Document Revised: 06/18/2015 Document Reviewed: 12/28/2014 °Elsevier Interactive Patient Education © 2017 [employer] ° °

## 2016-06-03 ENCOUNTER — Ambulatory Visit
Admission: RE | Admit: 2016-06-03 | Discharge: 2016-06-03 | Disposition: A | Discharge status: Home / Self Care | Payer: Medicare HMO | Source: Ambulatory Visit | Attending: Ophthalmology | Admitting: Ophthalmology

## 2016-06-03 ENCOUNTER — Ambulatory Visit: Payer: Medicare HMO | Admitting: Anesthesiology

## 2016-06-03 ENCOUNTER — Encounter
Admission: RE | Disposition: A | Discharge status: Home / Self Care | Payer: Self-pay | Source: Ambulatory Visit | Attending: Ophthalmology

## 2016-06-03 DIAGNOSIS — H2512 Age-related nuclear cataract, left eye: Secondary | ICD-10-CM | POA: Diagnosis not present

## 2016-06-03 HISTORY — PX: CATARACT EXTRACTION W/PHACO: SHX586

## 2016-06-03 LAB — GLUCOSE, CAPILLARY
Glucose-Capillary: 352 mg/dL — ABNORMAL HIGH (ref 65–99)
Glucose-Capillary: 363 mg/dL — ABNORMAL HIGH (ref 65–99)

## 2016-06-03 SURGERY — PHACOEMULSIFICATION, CATARACT, WITH IOL INSERTION
Anesthesia: Monitor Anesthesia Care | Site: Eye | Laterality: Left | Wound class: Clean

## 2016-06-03 MED ORDER — SODIUM HYALURONATE 23 MG/ML IO SOLN
INTRAOCULAR | Status: DC | PRN
  Administered 2016-06-03: 0.6 mL via INTRAOCULAR

## 2016-06-03 MED ORDER — LIDOCAINE HCL (PF) 2 % IJ SOLN
INTRAOCULAR | Status: DC | PRN
  Administered 2016-06-03: 2 mL via INTRAOCULAR

## 2016-06-03 MED ORDER — ARMC OPHTHALMIC DILATING DROPS
1.0000 "application " | OPHTHALMIC | Status: DC | PRN
  Administered 2016-06-03 (×3): 1 via OPHTHALMIC

## 2016-06-03 MED ORDER — LACTATED RINGERS IV SOLN: 1000.0000 mL | INTRAVENOUS | Status: DC

## 2016-06-03 MED ORDER — FENTANYL CITRATE (PF) 100 MCG/2ML IJ SOLN
INTRAMUSCULAR | Status: DC | PRN
  Administered 2016-06-03: 50 ug via INTRAVENOUS

## 2016-06-03 MED ORDER — EPINEPHRINE PF 1 MG/ML IJ SOLN
INTRAOCULAR | Status: DC | PRN
  Administered 2016-06-03: 70 mL via OPHTHALMIC

## 2016-06-03 MED ORDER — SODIUM HYALURONATE 10 MG/ML IO SOLN
INTRAOCULAR | Status: DC | PRN
  Administered 2016-06-03: 0.55 mL via INTRAOCULAR

## 2016-06-03 MED ORDER — MOXIFLOXACIN HCL 0.5 % OP SOLN
OPHTHALMIC | Status: DC | PRN
  Administered 2016-06-03: 0.2 mL via OPHTHALMIC

## 2016-06-03 MED ORDER — MIDAZOLAM HCL 2 MG/2ML IJ SOLN
INTRAMUSCULAR | Status: DC | PRN
  Administered 2016-06-03: 2 mg via INTRAVENOUS

## 2016-06-03 MED ORDER — INSULIN REGULAR HUMAN 100 UNIT/ML IJ SOLN
5.0000 [IU] | Freq: Once | INTRAMUSCULAR | Status: AC
  Administered 2016-06-03: 5 [IU] via SUBCUTANEOUS

## 2016-06-03 SURGICAL SUPPLY — 17 items
CANNULA ANT/CHMB 27G (MISCELLANEOUS) ×1 IMPLANT
CANNULA ANT/CHMB 27GA (MISCELLANEOUS) ×2 IMPLANT
DISSECTOR HYDRO NUCLEUS 50X22 (MISCELLANEOUS) ×2 IMPLANT
GLOVE BIO SURGEON STRL SZ8 (GLOVE) ×2 IMPLANT
GLOVE SURG LX 7.5 STRW (GLOVE) ×1
GLOVE SURG LX STRL 7.5 STRW (GLOVE) ×1 IMPLANT
GOWN STRL REUS W/ TWL LRG LVL3 (GOWN DISPOSABLE) ×2 IMPLANT
GOWN STRL REUS W/TWL LRG LVL3 (GOWN DISPOSABLE) ×2
LENS IOL TECNIS ITEC 26.0 (Intraocular Lens) ×2 IMPLANT
MARKER SKIN DUAL TIP RULER LAB (MISCELLANEOUS) ×2 IMPLANT
PACK CATARACT (MISCELLANEOUS) ×2 IMPLANT
PACK DR. KING ARMS (PACKS) ×2 IMPLANT
PACK EYE AFTER SURG (MISCELLANEOUS) ×2 IMPLANT
SYR 3ML LL SCALE MARK (SYRINGE) ×2 IMPLANT
SYR TB 1ML LUER SLIP (SYRINGE) ×2 IMPLANT
WATER STERILE IRR 500ML POUR (IV SOLUTION) ×2 IMPLANT
WIPE NON LINTING 3.25X3.25 (MISCELLANEOUS) ×2 IMPLANT

## 2016-06-03 NOTE — Transfer of Care (Signed)
Immediate Anesthesia Transfer of Care Note  Patient: Ann Price  Procedure(s) Performed: Procedure(s) with comments: CATARACT EXTRACTION PHACO AND INTRAOCULAR LENS PLACEMENT (IOC) (Left) - LEFT DIABETES - oral meds  Patient Location: PACU  Anesthesia Type: MAC  Level of Consciousness: awake, alert  and patient cooperative  Airway and Oxygen Therapy: Patient Spontanous Breathing and Patient connected to supplemental oxygen  Post-op Assessment: Post-op Vital signs reviewed, Patient's Cardiovascular Status Stable, Respiratory Function Stable, Patent Airway and No signs of Nausea or vomiting  Post-op Vital Signs: Reviewed and stable  Complications: No apparent anesthesia complications

## 2016-06-03 NOTE — Op Note (Signed)
OPERATIVE NOTE  Ann OddiBarbara L Powe [id number] 06/03/2016   PREOPERATIVE DIAGNOSIS:  Nuclear sclerotic cataract left eye.  H25.12   POSTOPERATIVE DIAGNOSIS:    Nuclear sclerotic cataract left eye.     PROCEDURE:  Phacoemusification with posterior chamber intraocular lens placement of the left eye   LENS:   Implant Name Type Inv. Item Serial No. Manufacturer Lot No. LRB No. Used  LENS IOL DIOP 26.0 - [id number] Intraocular Lens LENS IOL DIOP 26.0 [id number] AMO   Left 1       PCB00 +26.0   ULTRASOUND TIME: 0 minutes 27.1 seconds.  CDE 3.63   SURGEON:  Willey BladeBradley Janasia Coverdale, MD, MPH   ANESTHESIA:  Topical with tetracaine drops augmented with 1% preservative-free intracameral lidocaine.  ESTIMATED BLOOD LOSS: <1 mL   COMPLICATIONS:  None.   DESCRIPTION OF PROCEDURE:  The patient was identified in the holding room and transported to the operating room and placed in the supine position under the operating microscope.  The left eye was identified as the operative eye and it was prepped and draped in the usual sterile ophthalmic fashion.   A 1.0 millimeter clear-corneal paracentesis was made at the 5:00 position. 0.5 ml of preservative-free 1% lidocaine with epinephrine was injected into the anterior chamber.  The anterior chamber was filled with Healon 5 viscoelastic.  A 2.4 millimeter keratome was used to make a near-clear corneal incision at the 2:00 position.  A curvilinear capsulorrhexis was made with a cystotome and capsulorrhexis forceps.  Balanced salt solution was used to hydrodissect and hydrodelineate the nucleus.   Phacoemulsification was then used in stop and chop fashion to remove the lens nucleus and epinucleus.  The remaining cortex was then removed using the irrigation and aspiration handpiece. Healon was then placed into the capsular bag to distend it for lens placement.    Since the pupil was mildly miotic and mild floppy iris, the periphery was inspected by gentle manipulation of  the iris with the anis pupil.  A lens was then injected into the capsular bag.  The remaining viscoelastic was aspirated.   Wounds were hydrated with balanced salt solution.  The anterior chamber was inflated to a physiologic pressure with balanced salt solution.  Intracameral vigamox 0.1 mL undiltued was injected into the eye and a drop placed onto the ocular surface.  No wound leaks were noted.  The patient was taken to the recovery room in stable condition without complications of anesthesia or surgery  Willey BladeBradley Chinedu Agustin 06/03/2016, 8:05 AM

## 2016-06-03 NOTE — Anesthesia Procedure Notes (Signed)
Procedure Name: MAC Performed by: Mayme Genta Pre-anesthesia Checklist: Patient identified, Emergency Drugs available, Suction available, Timeout performed and Patient being monitored Patient Re-evaluated:Patient Re-evaluated prior to inductionOxygen Delivery Method: Nasal cannula Placement Confirmation: positive ETCO2

## 2016-06-03 NOTE — Anesthesia Preprocedure Evaluation (Addendum)
Anesthesia Evaluation  Patient identified by MRN, date of birth, ID band Patient awake    Reviewed: Allergy & Precautions, NPO status , Patient's Chart, lab work & pertinent test results, reviewed documented beta blocker date and time   Airway Mallampati: II   Neck ROM: Full    Dental  (+) Edentulous Upper, Upper Dentures, Lower Dentures   Pulmonary neg pulmonary ROS,    Pulmonary exam normal breath sounds clear to auscultation       Cardiovascular hypertension, Normal cardiovascular exam Rhythm:Regular Rate:Normal     Neuro/Psych CVA (2016 on plavix), No Residual Symptoms negative psych ROS   GI/Hepatic Neg liver ROS, GERD  ,  Endo/Other  diabetes, Poorly Controlled, Type 2Morbid obesity  Renal/GU CRFRenal disease  negative genitourinary   Musculoskeletal  (+) Arthritis ,   Abdominal (+) + obese,  Abdomen: soft.    Peds negative pediatric ROS (+)  Hematology negative hematology ROS (+)   Anesthesia Other Findings   Reproductive/Obstetrics negative OB ROS                            Anesthesia Physical Anesthesia Plan  ASA: III  Anesthesia Plan: MAC   Post-op Pain Management:    Induction:   Airway Management Planned: Nasal Cannula  Additional Equipment: None  Intra-op Plan:   Post-operative Plan:   Informed Consent: I have reviewed the patients History and Physical, chart, labs and discussed the procedure including the risks, benefits and alternatives for the proposed anesthesia with the patient or authorized representative who has indicated his/her understanding and acceptance.     Plan Discussed with: CRNA, Anesthesiologist and Surgeon  Anesthesia Plan Comments:       Anesthesia Quick Evaluation

## 2016-06-03 NOTE — Anesthesia Postprocedure Evaluation (Signed)
Anesthesia Post Note  Patient: Ann Price  Procedure(s) Performed: Procedure(s) (LRB): CATARACT EXTRACTION PHACO AND INTRAOCULAR LENS PLACEMENT (IOC) (Left)  Patient location during evaluation: PACU Anesthesia Type: MAC Level of consciousness: awake Pain management: pain level controlled Vital Signs Assessment: post-procedure vital signs reviewed and stable Respiratory status: spontaneous breathing Cardiovascular status: blood pressure returned to baseline Postop Assessment: no headache Anesthetic complications: no Comments: BG remains high, last A1c > 12, managed with 5 units subcutaneous insulin preop    Lavonna Monarch

## 2016-06-03 NOTE — H&P (Signed)
The History and Physical notes are on paper, have been signed, and are to be scanned.   I have examined the patient and there are no changes to the H&P.   Ann BladeBradley Price 06/03/2016 7:20 AM

## 2016-06-04 ENCOUNTER — Encounter: Payer: Self-pay | Admitting: Ophthalmology

## 2016-06-21 ENCOUNTER — Encounter: Payer: Self-pay | Admitting: Emergency Medicine

## 2016-06-21 ENCOUNTER — Observation Stay
Admission: EM | Admit: 2016-06-21 | Discharge: 2016-06-22 | Disposition: A | Discharge status: Home / Self Care | Payer: Medicare HMO | Attending: Internal Medicine | Admitting: Internal Medicine

## 2016-06-21 ENCOUNTER — Observation Stay: Payer: Medicare HMO

## 2016-06-21 ENCOUNTER — Emergency Department: Payer: Medicare HMO

## 2016-06-21 DIAGNOSIS — Z7984 Long term (current) use of oral hypoglycemic drugs: Secondary | ICD-10-CM | POA: Diagnosis not present

## 2016-06-21 DIAGNOSIS — I10 Essential (primary) hypertension: Secondary | ICD-10-CM | POA: Insufficient documentation

## 2016-06-21 DIAGNOSIS — E785 Hyperlipidemia, unspecified: Secondary | ICD-10-CM | POA: Insufficient documentation

## 2016-06-21 DIAGNOSIS — E1165 Type 2 diabetes mellitus with hyperglycemia: Secondary | ICD-10-CM | POA: Insufficient documentation

## 2016-06-21 DIAGNOSIS — E119 Type 2 diabetes mellitus without complications: Secondary | ICD-10-CM | POA: Diagnosis not present

## 2016-06-21 DIAGNOSIS — K219 Gastro-esophageal reflux disease without esophagitis: Secondary | ICD-10-CM | POA: Insufficient documentation

## 2016-06-21 DIAGNOSIS — Z8673 Personal history of transient ischemic attack (TIA), and cerebral infarction without residual deficits: Secondary | ICD-10-CM | POA: Diagnosis not present

## 2016-06-21 DIAGNOSIS — I639 Cerebral infarction, unspecified: Principal | ICD-10-CM | POA: Diagnosis present

## 2016-06-21 DIAGNOSIS — R0609 Other forms of dyspnea: Secondary | ICD-10-CM | POA: Diagnosis not present

## 2016-06-21 DIAGNOSIS — Z79899 Other long term (current) drug therapy: Secondary | ICD-10-CM | POA: Insufficient documentation

## 2016-06-21 DIAGNOSIS — M6281 Muscle weakness (generalized): Secondary | ICD-10-CM | POA: Diagnosis not present

## 2016-06-21 DIAGNOSIS — R29898 Other symptoms and signs involving the musculoskeletal system: Secondary | ICD-10-CM

## 2016-06-21 DIAGNOSIS — Z880 Allergy status to penicillin: Secondary | ICD-10-CM | POA: Diagnosis not present

## 2016-06-21 DIAGNOSIS — R29818 Other symptoms and signs involving the nervous system: Secondary | ICD-10-CM | POA: Diagnosis not present

## 2016-06-21 DIAGNOSIS — R202 Paresthesia of skin: Secondary | ICD-10-CM

## 2016-06-21 DIAGNOSIS — R51 Headache: Secondary | ICD-10-CM | POA: Insufficient documentation

## 2016-06-21 DIAGNOSIS — R5383 Other fatigue: Secondary | ICD-10-CM | POA: Diagnosis not present

## 2016-06-21 DIAGNOSIS — R5381 Other malaise: Secondary | ICD-10-CM | POA: Diagnosis not present

## 2016-06-21 DIAGNOSIS — R42 Dizziness and giddiness: Secondary | ICD-10-CM | POA: Diagnosis not present

## 2016-06-21 DIAGNOSIS — M170 Bilateral primary osteoarthritis of knee: Secondary | ICD-10-CM | POA: Diagnosis not present

## 2016-06-21 LAB — COMPREHENSIVE METABOLIC PANEL
ALK PHOS: 107 U/L (ref 38–126)
ALT: 14 U/L (ref 14–54)
ANION GAP: 7 (ref 5–15)
AST: 14 U/L — ABNORMAL LOW (ref 15–41)
Albumin: 4 g/dL (ref 3.5–5.0)
BILIRUBIN TOTAL: 0.7 mg/dL (ref 0.3–1.2)
BUN: 11 mg/dL (ref 6–20)
CALCIUM: 9.3 mg/dL (ref 8.9–10.3)
CO2: 28 mmol/L (ref 22–32)
Chloride: 100 mmol/L — ABNORMAL LOW (ref 101–111)
Creatinine, Ser: 1 mg/dL (ref 0.44–1.00)
GFR calc non Af Amer: 55 mL/min — ABNORMAL LOW (ref >60)
Glucose, Bld: 304 mg/dL — ABNORMAL HIGH (ref 65–99)
Potassium: 3.6 mmol/L (ref 3.5–5.1)
Sodium: 135 mmol/L (ref 135–145)
TOTAL PROTEIN: 8.4 g/dL — AB (ref 6.5–8.1)

## 2016-06-21 LAB — DIFFERENTIAL
Basophils Absolute: 0 10*3/uL (ref 0–0.1)
Basophils Relative: 1 %
EOS PCT: 2 %
Eosinophils Absolute: 0.1 10*3/uL (ref 0–0.7)
LYMPHS ABS: 1.8 10*3/uL (ref 1.0–3.6)
LYMPHS PCT: 25 %
MONO ABS: 0.3 10*3/uL (ref 0.2–0.9)
Monocytes Relative: 3 %
Neutro Abs: 5.3 10*3/uL (ref 1.4–6.5)
Neutrophils Relative %: 69 %

## 2016-06-21 LAB — URINALYSIS, ROUTINE W REFLEX MICROSCOPIC
BILIRUBIN URINE: NEGATIVE
Bacteria, UA: NONE SEEN
Hgb urine dipstick: NEGATIVE
KETONES UR: NEGATIVE mg/dL
LEUKOCYTES UA: NEGATIVE
NITRITE: NEGATIVE
PH: 6 (ref 5.0–8.0)
Protein, ur: NEGATIVE mg/dL
Specific Gravity, Urine: 1.029 (ref 1.005–1.030)

## 2016-06-21 LAB — CBC
HCT: 40.2 % (ref 35.0–47.0)
HEMOGLOBIN: 13.1 g/dL (ref 12.0–16.0)
MCH: 22.2 pg — AB (ref 26.0–34.0)
MCHC: 32.6 g/dL (ref 32.0–36.0)
MCV: 68 fL — AB (ref 80.0–100.0)
PLATELETS: 244 10*3/uL (ref 150–440)
RBC: 5.9 MIL/uL — AB (ref 3.80–5.20)
RDW: 18.1 % — ABNORMAL HIGH (ref 11.5–14.5)
WBC: 7.5 10*3/uL (ref 3.6–11.0)

## 2016-06-21 LAB — TROPONIN I: Troponin I: <0.03 ng/mL (ref <0.03)

## 2016-06-21 LAB — APTT: aPTT: 29 seconds (ref 24–36)

## 2016-06-21 LAB — PROTIME-INR
INR: 1.09
PROTHROMBIN TIME: 14.1 s (ref 11.4–15.2)

## 2016-06-21 LAB — ETHANOL: Alcohol, Ethyl (B): <5 mg/dL (ref <5)

## 2016-06-21 MED ORDER — ONDANSETRON HCL 4 MG PO TABS: 4.0000 mg | ORAL_TABLET | Freq: Four times a day (QID) | ORAL | Status: DC | PRN

## 2016-06-21 MED ORDER — CLOPIDOGREL BISULFATE 75 MG PO TABS
75.0000 mg | ORAL_TABLET | Freq: Every day | ORAL | Status: DC
  Administered 2016-06-22: 08:00:00 75 mg via ORAL
  Filled 2016-06-21: qty 1

## 2016-06-21 MED ORDER — CLOPIDOGREL BISULFATE 75 MG PO TABS
75.0000 mg | ORAL_TABLET | Freq: Once | ORAL | Status: AC
Start: 2016-06-21 — End: 2016-06-21
  Administered 2016-06-21: 75 mg via ORAL
  Filled 2016-06-21: qty 1

## 2016-06-21 MED ORDER — HYDRALAZINE HCL 50 MG PO TABS
25.0000 mg | ORAL_TABLET | Freq: Every day | ORAL | Status: DC
  Administered 2016-06-21 – 2016-06-22 (×2): 25 mg via ORAL
  Filled 2016-06-21 (×2): qty 1

## 2016-06-21 MED ORDER — PIOGLITAZONE HCL 45 MG PO TABS
45.0000 mg | ORAL_TABLET | Freq: Every day | ORAL | Status: DC
Start: 2016-06-22 — End: 2016-06-22
  Administered 2016-06-22: 08:00:00 45 mg via ORAL
  Filled 2016-06-21: qty 1

## 2016-06-21 MED ORDER — LOSARTAN POTASSIUM 50 MG PO TABS
100.0000 mg | ORAL_TABLET | Freq: Every day | ORAL | Status: DC
  Administered 2016-06-21 – 2016-06-22 (×2): 100 mg via ORAL
  Filled 2016-06-21 (×2): qty 2

## 2016-06-21 MED ORDER — GLIPIZIDE 10 MG PO TABS
20.0000 mg | ORAL_TABLET | Freq: Every day | ORAL | Status: DC
  Administered 2016-06-22: 20 mg via ORAL
  Filled 2016-06-21: qty 2

## 2016-06-21 MED ORDER — ACETAMINOPHEN 650 MG RE SUPP: 650.0000 mg | Freq: Four times a day (QID) | RECTAL | Status: DC | PRN

## 2016-06-21 MED ORDER — HYDROCHLOROTHIAZIDE 25 MG PO TABS
25.0000 mg | ORAL_TABLET | Freq: Every day | ORAL | Status: DC
  Administered 2016-06-21 – 2016-06-22 (×2): 25 mg via ORAL
  Filled 2016-06-21 (×2): qty 1

## 2016-06-21 MED ORDER — ENOXAPARIN SODIUM 40 MG/0.4ML ~~LOC~~ SOLN
40.0000 mg | SUBCUTANEOUS | Status: DC
  Administered 2016-06-21: 22:00:00 40 mg via SUBCUTANEOUS
  Filled 2016-06-21: qty 0.4

## 2016-06-21 MED ORDER — ATENOLOL 25 MG PO TABS
50.0000 mg | ORAL_TABLET | Freq: Every day | ORAL | Status: DC
  Administered 2016-06-21 – 2016-06-22 (×2): 50 mg via ORAL
  Filled 2016-06-21 (×2): qty 2

## 2016-06-21 MED ORDER — ACETAMINOPHEN 325 MG PO TABS
650.0000 mg | ORAL_TABLET | Freq: Four times a day (QID) | ORAL | Status: DC | PRN
  Administered 2016-06-21: 650 mg via ORAL
  Filled 2016-06-21: qty 2

## 2016-06-21 MED ORDER — ONDANSETRON HCL 4 MG/2ML IJ SOLN
4.0000 mg | Freq: Four times a day (QID) | INTRAMUSCULAR | Status: DC | PRN
Start: 2016-06-21 — End: 2016-06-22
  Administered 2016-06-22: 4 mg via INTRAVENOUS
  Filled 2016-06-21: qty 2

## 2016-06-21 MED ORDER — AMLODIPINE BESYLATE 10 MG PO TABS
10.0000 mg | ORAL_TABLET | Freq: Every day | ORAL | Status: DC
  Administered 2016-06-21 – 2016-06-22 (×2): 10 mg via ORAL
  Filled 2016-06-21: qty 1
  Filled 2016-06-21: qty 2

## 2016-06-21 NOTE — ED Provider Notes (Signed)
[facility] Emergency Department Provider Note   ____________________________________________   First MD Initiated Contact with Patient 06/21/16 1315     (approximate)  I have reviewed the triage vital signs and the nursing notes.   HISTORY  Chief Complaint Dizziness    HPI Ann OddiBarbara L Horine is a 71 y.o. female reports that for about the last 24 hours she has felt a strange sense of fatigue or dizziness, a mild slightly throbbing headache at the base of the left skull, it seems to radiate out towards both sides the back of the scalp. She also been associated with a feeling of tingling and slight numbness over the left side of the face, left hand, and some slight weakness in the left hand as well. She reports she's had similar symptoms when she had a very mild stroke a few years ago. She is stop taking her Plavix after having a cataract surgery, and has not yet resumed it.  Denies nausea or vomiting. No fevers or chills. No neck pain. No chest pain or trouble breathing.  Mild to moderate throbbing headache.  Past Medical History:  Diagnosis Date  . Arthritis    knees  . Diabetes mellitus without complication (HCC)   . GERD (gastroesophageal reflux disease)   . Hyperlipidemia   . Hypertension   . Stroke (HCC) 2016   (TIA) no deficits  . Wears dentures    full upper, partial lower    There are no active problems to display for this patient.   Past Surgical History:  Procedure Laterality Date  . CATARACT EXTRACTION W/PHACO Right 03/25/2016   Procedure: CATARACT EXTRACTION PHACO AND INTRAOCULAR LENS PLACEMENT (IOC)  right diabetic;  Surgeon: Nevada CraneBradley Quenisha Lovins King, MD;  [facility];  Service: Ophthalmology;  Laterality: Right;  diabetic - oral meds  . CATARACT EXTRACTION W/PHACO Left 06/03/2016   Procedure: CATARACT EXTRACTION PHACO AND INTRAOCULAR LENS PLACEMENT (IOC);  Surgeon: Nevada CraneKing, Bradley Krosby Ritchie, MD;  [facility];   Service: Ophthalmology;  Laterality: Left;  LEFT DIABETES - oral meds  . COLONOSCOPY    . KNEE ARTHROSCOPY Right 03/22/2015   Procedure: ARTHROSCOPY KNEE, PARTIAL MEDIAL MENISECTOMY;  Surgeon: Kennedy BuckerMichael Menz, MD;  Location: [facility] ORS;  Service: Orthopedics;  Laterality: Right;  . TUBAL LIGATION    . UPPER GI ENDOSCOPY    . VAGINAL HYSTERECTOMY      Prior to Admission medications   Medication Sig Start Date End Date Taking? Authorizing Provider  amLODipine (NORVASC) 10 MG tablet Take 10 mg by mouth daily.   Yes [provider]  atenolol (TENORMIN) 50 MG tablet Take 50 mg by mouth daily.   Yes [provider]  clopidogrel (PLAVIX) 75 MG tablet Take 75 mg by mouth daily.   Yes [provider]  glipiZIDE (GLUCOTROL) 10 MG tablet Take 20 mg by mouth daily before breakfast.   Yes [provider]  hydrALAZINE (APRESOLINE) 25 MG tablet Take 25 mg by mouth daily.    Yes [provider]  hydrochlorothiazide (HYDRODIURIL) 25 MG tablet Take 25 mg by mouth daily.   Yes [provider]  losartan (COZAAR) 100 MG tablet Take 100 mg by mouth daily.   Yes [provider]  pioglitazone (ACTOS) 45 MG tablet Take 45 mg by mouth daily.   Yes [provider]    Allergies Aspirin and Penicillins  No family history on file.  Social History Social History  Substance Use Topics  . Smoking status: Never  Smoker  . Smokeless tobacco: Never Used  . Alcohol use No    Review of Systems Constitutional: No fever/chills Eyes: No visual changes, since her cataract surgery she's continued to have occasional slight discomfort in the left eye which is not worsened. ENT: No sore throat. Cardiovascular: Denies chest pain. Respiratory: Denies shortness of breath. Gastrointestinal: No abdominal pain.  No nausea, no vomiting.  No diarrhea.  No constipation. Genitourinary: Negative for dysuria. Musculoskeletal: Negative for back pain. Skin: Negative  for rash. Neurological: See history of present illness  10-point ROS otherwise negative.  ____________________________________________   PHYSICAL EXAM:  VITAL SIGNS: ED Triage Vitals  Enc Vitals Group     BP 06/21/16 1242 (!) 129/92     Pulse Rate 06/21/16 1242 67     Resp 06/21/16 1242 18     Temp 06/21/16 1242 98.1 F (36.7 C)     Temp Source 06/21/16 1242 Oral     SpO2 06/21/16 1242 98 %     Weight 06/21/16 1243 199 lb (90.3 kg)     Height 06/21/16 1243 5\' 4"  (1.626 m)     Head Circumference --      Peak Flow --      Pain Score 06/21/16 1241 7     Pain Loc --      Pain Edu? --      Excl. in GC? --     Constitutional: Alert and oriented. Well appearing and in no acute distress.Very pleasant Eyes: Conjunctivae are normal. PERRL. EOMI. no abnormality noted to close inspection of the left anterior eye Head: Atraumatic. Nose: No congestion/rhinnorhea. Mouth/Throat: Mucous membranes are moist.  Oropharynx non-erythematous. Neck: No stridor.   Cardiovascular: Normal rate, regular rhythm. Grossly normal heart sounds.  Good peripheral circulation. Respiratory: Normal respiratory effort.  No retractions. Lungs CTAB. Gastrointestinal: Soft and nontender. No distention.  Musculoskeletal: No lower extremity tenderness nor edema.  No joint effusions. Neurologic:  Normal speech and language.  NIH score equals 3, performed by me at bedside. The patient has minimal pronator drift to the left arm The patient has normal cranial nerve exam. Extraocular movements are normal. Visual fields are normal. Patient has 5 out of 5 strength in all extremities except grip strength in the left hand seems slightly reduced. There is no numbness or gross, acute sensory abnormality in the extremities bilaterally that she does note slight decreased sensation over the left arm and left face. No speech disturbance. No dysarthria. No aphasia. No ataxia. Normal finger nose finger bilat. Patient  speaking in full and clear sentences.   Skin:  Skin is warm, dry and intact. No rash noted. Psychiatric: Mood and affect are normal. Speech and behavior are normal.  ____________________________________________   LABS (all labs ordered are listed, but only abnormal results are displayed)  Labs Reviewed  CBC - Abnormal; Notable for the following:       Result Value   RBC 5.90 (*)    MCV 68.0 (*)    MCH 22.2 (*)    RDW 18.1 (*)    All other components within normal limits  COMPREHENSIVE METABOLIC PANEL - Abnormal; Notable for the following:    Chloride 100 (*)    Glucose, Bld 304 (*)    Total Protein 8.4 (*)    AST 14 (*)    GFR calc non Af Amer 55 (*)    All other components within normal limits  URINALYSIS, ROUTINE W REFLEX MICROSCOPIC - Abnormal; Notable for the following:  Color, Urine YELLOW (*)    APPearance CLEAR (*)    Glucose, UA >=500 (*)    Squamous Epithelial / LPF 0-5 (*)    All other components within normal limits  ETHANOL  PROTIME-INR  APTT  DIFFERENTIAL  TROPONIN I   ____________________________________________  EKG  Reviewed and interpreted by me at 1300 Ventricular rate 70 QRS 80 QTc 420 PR normal Normal sinus rhythm, no evidence of acute ischemia, probable repolarization abnormality seen anteriorly  Compared to previous EKG from 03/14/2015, no significant changes noted ____________________________________________  RADIOLOGY  Ct Head Wo Contrast  Result Date: 06/21/2016 CLINICAL DATA:  71 year old female with dizziness, weakness and left-sided facial numbness for the past 2 days. EXAM: CT HEAD WITHOUT CONTRAST TECHNIQUE: Contiguous axial images were obtained from the base of the skull through the vertex without intravenous contrast. COMPARISON:  Head CT 04/18/2014. FINDINGS: Brain: Old lacunar infarct in the superior aspect of the right [place]. Mild cerebral atrophy. Patchy areas of decreased attenuation are noted throughout the deep and  periventricular white matter of the cerebral hemispheres bilaterally, compatible with chronic microvascular ischemic disease. No evidence of acute infarction, hemorrhage, hydrocephalus, extra-axial collection or mass lesion/mass effect. Vascular: No hyperdense vessel or unexpected calcification. Skull: Normal. Negative for fracture or focal lesion. Sinuses/Orbits: No acute finding. Other: None. IMPRESSION: 1. No acute intracranial abnormalities. 2. Mild cerebral atrophy with mild chronic microvascular ischemic changes and old right basal ganglia lacunar infarct, as above. Electronically Signed   By: Trudie Reed M.D.   On: 06/21/2016 14:17    ____________________________________________   PROCEDURES  Procedure(s) performed: None  Procedures  Critical Care performed: No  ____________________________________________   INITIAL IMPRESSION / ASSESSMENT AND PLAN / ED COURSE  Pertinent labs & imaging results that were available during my care of the patient were reviewed by me and considered in my medical decision making (see chart for details).  Patient presents for mild headache with associated slight hand weakness and tingling that started in the last 1-2 days. No sudden change today, but has persistent feeling of fatigue and a mild throbbing headache. A reassuring exam, except she does have a mild weakness in the left hand and decreased sensation over the left face and left arm. I suspect she may have had a another small stroke, especially given she has stopped her Plavix for the last couple of weeks. CT of the head is negative, suspect likely a very small ischemic stroke is occurred. She is well outside the TPA window, and also given symptomatology started 1-2 days ago do not believe she would benefit from emergent neuro intervention. She has no evidence to suggest a large vessel occlusion on exam either.  ----------------------------------------- 3:02 PM on  06/21/2016 -----------------------------------------  Stable exam. Patient reports she is comfortable. Discussed with patient, she is agreeable with plan to admit to the hospital for further workup. States aspirin allergy, and I will restart her Plavix.  Case discussed with hospitalist, Dr. Lubertha South.      ____________________________________________   FINAL CLINICAL IMPRESSION(S) / ED DIAGNOSES  Final diagnoses:  Weakness of left hand  Left hand paresthesia      NEW MEDICATIONS STARTED DURING THIS VISIT:  New Prescriptions   No medications on file     Note:  This document was prepared using Dragon voice recognition software and may include unintentional dictation errors.     Sharyn Creamer, MD 06/21/16 [id number]

## 2016-06-21 NOTE — ED Notes (Signed)
Patient transported to Ultrasound 

## 2016-06-21 NOTE — ED Triage Notes (Signed)
Dizzy x 2 days, headache began yesterday.

## 2016-06-21 NOTE — H&P (Signed)
[facility] [place] at [facility]   PATIENT NAME: Ann Price    MR#:  [id number]  DATE OF BIRTH:  08/13/1945  DATE OF ADMISSION:  06/21/2016  PRIMARY CARE PHYSICIAN: Leotis ShamesSingh, Jasmine, MD   REQUESTING/REFERRING PHYSICIAN: Dr. Sharyn CreamerMark Quale  CHIEF COMPLAINT:   Chief Complaint  Patient presents with  . Dizziness    HISTORY OF PRESENT ILLNESS:  Ann AoBarbara Mounts  is a 71 y.o. female with a known history of Diabetes without complication, hypertension, hyperlipidemia, history of previous CVA, osteoarthritis who presents to the hospital due to weakness, dizziness and left-sided numbness that began a few days back. Patient says that she has just not been feeling well this entire week which is progress to patient feeling dizzy and now having left fingers and hand numbness which has persisted. Patient does have a previous history of CVA and was supposed to be on Plavix but it was discontinued for unclear reasons this past February. Given patient's neurologic symptoms which have persisted hospitalist services were contacted further treatment and evaluation. Patient denies any chest pains, shortness of breath, headache, nausea, vomiting, abdominal pain, melena, hematochezia or any other associated symptoms presently.  PAST MEDICAL HISTORY:   Past Medical History:  Diagnosis Date  . Arthritis    knees  . Diabetes mellitus without complication (HCC)   . GERD (gastroesophageal reflux disease)   . Hyperlipidemia   . Hypertension   . Stroke (HCC) 2016   (TIA) no deficits  . Wears dentures    full upper, partial lower    PAST SURGICAL HISTORY:   Past Surgical History:  Procedure Laterality Date  . CATARACT EXTRACTION W/PHACO Right 03/25/2016   Procedure: CATARACT EXTRACTION PHACO AND INTRAOCULAR LENS PLACEMENT (IOC)  right diabetic;  Surgeon: Nevada CraneBradley Mark King, MD;  [facility];  Service: Ophthalmology;  Laterality: Right;  diabetic - oral meds  . CATARACT  EXTRACTION W/PHACO Left 06/03/2016   Procedure: CATARACT EXTRACTION PHACO AND INTRAOCULAR LENS PLACEMENT (IOC);  Surgeon: Nevada CraneKing, Bradley Mark, MD;  [facility];  Service: Ophthalmology;  Laterality: Left;  LEFT DIABETES - oral meds  . COLONOSCOPY    . KNEE ARTHROSCOPY Right 03/22/2015   Procedure: ARTHROSCOPY KNEE, PARTIAL MEDIAL MENISECTOMY;  Surgeon: Kennedy BuckerMichael Menz, MD;  Location: [facility];  Service: Orthopedics;  Laterality: Right;  . TUBAL LIGATION    . UPPER GI ENDOSCOPY    . VAGINAL HYSTERECTOMY      SOCIAL HISTORY:   Social History  Substance Use Topics  . Smoking status: Never Smoker  . Smokeless tobacco: Never Used  . Alcohol use No    FAMILY HISTORY:  No family history on file.  DRUG ALLERGIES:   Allergies  Allergen Reactions  . Aspirin Itching  . Penicillins Other (See Comments)    Stomach burns Has patient had a PCN reaction causing immediate rash, facial/tongue/throat swelling, SOB or lightheadedness with hypotension: No Has patient had a PCN reaction causing severe rash involving mucus membranes or skin necrosis: No Has patient had a PCN reaction that required hospitalization: No Has patient had a PCN reaction occurring within the last 10 years: No If all of the above answers are "NO", then may proceed with Cephalosporin use.     REVIEW OF SYSTEMS:   Review of Systems  Constitutional: Negative for fever and weight loss.  HENT: Negative for congestion, nosebleeds and tinnitus.   Eyes: Negative for blurred vision, double vision and redness.  Respiratory: Negative for cough, hemoptysis and shortness of  breath.   Cardiovascular: Negative for chest pain, orthopnea, leg swelling and PND.  Gastrointestinal: Negative for abdominal pain, diarrhea, melena, nausea and vomiting.  Genitourinary: Negative for dysuria, hematuria and urgency.  Musculoskeletal: Negative for falls and joint pain.  Neurological: Positive for dizziness, sensory change and  weakness. Negative for tingling, focal weakness, seizures and headaches.  Endo/Heme/Allergies: Negative for polydipsia. Does not bruise/bleed easily.  Psychiatric/Behavioral: Negative for depression and memory loss. The patient is not nervous/anxious.     MEDICATIONS AT HOME:   Prior to Admission medications   Medication Sig Start Date End Date Taking? Authorizing Provider  amLODipine (NORVASC) 10 MG tablet Take 10 mg by mouth daily.   Yes [provider]  atenolol (TENORMIN) 50 MG tablet Take 50 mg by mouth daily.   Yes [provider]  clopidogrel (PLAVIX) 75 MG tablet Take 75 mg by mouth daily.   Yes [provider]  glipiZIDE (GLUCOTROL) 10 MG tablet Take 20 mg by mouth daily before breakfast.   Yes [provider]  hydrALAZINE (APRESOLINE) 25 MG tablet Take 25 mg by mouth daily.    Yes [provider]  hydrochlorothiazide (HYDRODIURIL) 25 MG tablet Take 25 mg by mouth daily.   Yes [provider]  losartan (COZAAR) 100 MG tablet Take 100 mg by mouth daily.   Yes [provider]  pioglitazone (ACTOS) 45 MG tablet Take 45 mg by mouth daily.   Yes [provider]      VITAL SIGNS:  Blood pressure (!) 187/92, pulse 73, temperature 98.1 F (36.7 C), resp. rate 15, height 5\' 4"  (1.626 m), weight 90.3 kg (199 lb), SpO2 99 %.  PHYSICAL EXAMINATION:  Physical Exam  GENERAL:  71 y.o.-year-old patient lying in bed in no acute distress.  EYES: Pupils equal, round, reactive to light and accommodation. No scleral icterus. Extraocular muscles intact.  HEENT: Head atraumatic, normocephalic. Oropharynx and nasopharynx clear. No oropharyngeal erythema, moist oral mucosa  NECK:  Supple, no jugular venous distention. No thyroid enlargement, no tenderness.  LUNGS: Normal breath sounds bilaterally, no wheezing, rales, rhonchi. No use of accessory muscles of respiration.  CARDIOVASCULAR: S1, S2 RRR. No murmurs, rubs, gallops,  clicks.  ABDOMEN: Soft, nontender, nondistended. Bowel sounds present. No organomegaly or mass.  EXTREMITIES: No pedal edema, cyanosis, or clubbing. + 2 pedal & radial pulses b/l.   NEUROLOGIC: Cranial nerves II through XII are intact. No focal Motor or sensory deficits appreciated b/l. PSYCHIATRIC: The patient is alert and oriented x 3.  SKIN: No obvious rash, lesion, or ulcer.   LABORATORY PANEL:   CBC  Recent Labs Lab 06/21/16 1352  WBC 7.5  HGB 13.1  HCT 40.2  PLT 244   ------------------------------------------------------------------------------------------------------------------  Chemistries   Recent Labs Lab 06/21/16 1352  NA 135  K 3.6  CL 100*  CO2 28  GLUCOSE 304*  BUN 11  CREATININE 1.00  CALCIUM 9.3  AST 14*  ALT 14  ALKPHOS 107  BILITOT 0.7   ------------------------------------------------------------------------------------------------------------------  Cardiac Enzymes  Recent Labs Lab 06/21/16 1352  TROPONINI <0.03   ------------------------------------------------------------------------------------------------------------------  RADIOLOGY:  Ct Head Wo Contrast  Result Date: 06/21/2016 CLINICAL DATA:  71 year old female with dizziness, weakness and left-sided facial numbness for the past 2 days. EXAM: CT HEAD WITHOUT CONTRAST TECHNIQUE: Contiguous axial images were obtained from the base of the skull through the vertex without intravenous contrast. COMPARISON:  Head CT 04/18/2014. FINDINGS: Brain: Old lacunar infarct in the superior aspect of the right Putamen. Mild  cerebral atrophy. Patchy areas of decreased attenuation are noted throughout the deep and periventricular white matter of the cerebral hemispheres bilaterally, compatible with chronic microvascular ischemic disease. No evidence of acute infarction, hemorrhage, hydrocephalus, extra-axial collection or mass lesion/mass effect. Vascular: No hyperdense vessel or unexpected  calcification. Skull: Normal. Negative for fracture or focal lesion. Sinuses/Orbits: No acute finding. Other: None. IMPRESSION: 1. No acute intracranial abnormalities. 2. Mild cerebral atrophy with mild chronic microvascular ischemic changes and old right basal ganglia lacunar infarct, as above. Electronically Signed   By: Trudie Reed M.D.   On: 06/21/2016 14:17     IMPRESSION AND PLAN:   71 year old female with past medical history of diabetes, hypertension, osteoarthritis, previous history of CVA who presents to the hospital due to dizziness, weakness and left-sided numbness.  1. CVA/TIA-this is the working diagnosis given patient's left-sided numbness and weakness. -Patient is supposed to be on Plavix but it was discontinued for unclear reasons after her knee surgery in February. -Patient CT scan of the head is negative for acute pathology, we'll get MRI of the brain, carotid duplex and echocardiogram. -Resume Plavix.  2. Essential hypertension-continue Norvasc, atenolol, hydralazine, HCTZ, Losartan. -Follow hemodynamics.  3. Diabetes type 2 without complication-continue glipizide, Actos.  All the records are reviewed and case discussed with ED provider. Management plans discussed with the patient, family and they are in agreement.  CODE STATUS: Full  TOTAL TIME TAKING CARE OF THIS PATIENT: 40 minutes.    Houston Siren M.D on 06/21/2016 at 3:44 PM  Between 7am to 6pm - Pager - [phone]  After 6pm go to [email or web address] - password [email or web address] [facility]  [facility]  Office  [phone]  CC: Primary care physician; Leotis Shames, MD

## 2016-06-22 ENCOUNTER — Observation Stay: Payer: Medicare HMO

## 2016-06-22 ENCOUNTER — Observation Stay
Admit: 2016-06-22 | Discharge: 2016-06-22 | Disposition: A | Discharge status: Home / Self Care | Payer: Medicare HMO | Attending: Specialist | Admitting: Specialist

## 2016-06-22 DIAGNOSIS — G459 Transient cerebral ischemic attack, unspecified: Secondary | ICD-10-CM | POA: Diagnosis not present

## 2016-06-22 DIAGNOSIS — R531 Weakness: Secondary | ICD-10-CM | POA: Diagnosis not present

## 2016-06-22 DIAGNOSIS — E119 Type 2 diabetes mellitus without complications: Secondary | ICD-10-CM | POA: Diagnosis not present

## 2016-06-22 DIAGNOSIS — R42 Dizziness and giddiness: Secondary | ICD-10-CM | POA: Diagnosis not present

## 2016-06-22 DIAGNOSIS — I10 Essential (primary) hypertension: Secondary | ICD-10-CM | POA: Diagnosis not present

## 2016-06-22 DIAGNOSIS — I638 Other cerebral infarction: Secondary | ICD-10-CM | POA: Diagnosis not present

## 2016-06-22 DIAGNOSIS — E785 Hyperlipidemia, unspecified: Secondary | ICD-10-CM | POA: Diagnosis not present

## 2016-06-22 LAB — LIPID PANEL
CHOL/HDL RATIO: 6.1 ratio
Cholesterol: 263 mg/dL — ABNORMAL HIGH (ref 0–200)
HDL: 43 mg/dL (ref >40)
LDL Cholesterol: 166 mg/dL — ABNORMAL HIGH (ref 0–99)
Triglycerides: 270 mg/dL — ABNORMAL HIGH (ref <150)
VLDL: 54 mg/dL — AB (ref 0–40)

## 2016-06-22 LAB — GLUCOSE, CAPILLARY
GLUCOSE-CAPILLARY: 145 mg/dL — AB (ref 65–99)
Glucose-Capillary: 298 mg/dL — ABNORMAL HIGH (ref 65–99)

## 2016-06-22 LAB — ECHOCARDIOGRAM COMPLETE
Height: 64 in
WEIGHTICAEL: 3184 [oz_av]

## 2016-06-22 MED ORDER — ATORVASTATIN CALCIUM 20 MG PO TABS: 40.0000 mg | ORAL_TABLET | Freq: Every day | ORAL | Status: DC

## 2016-06-22 MED ORDER — METFORMIN HCL 500 MG PO TABS: 1000.0000 mg | ORAL_TABLET | Freq: Two times a day (BID) | ORAL | Status: DC

## 2016-06-22 MED ORDER — ATORVASTATIN CALCIUM 40 MG PO TABS: 40.0000 mg | ORAL_TABLET | Freq: Every day | ORAL | 2 refills | Status: AC

## 2016-06-22 MED ORDER — INSULIN ASPART 100 UNIT/ML ~~LOC~~ SOLN
0.0000 [IU] | Freq: Three times a day (TID) | SUBCUTANEOUS | Status: DC
  Administered 2016-06-22: 12:00:00 1 [IU] via SUBCUTANEOUS
  Administered 2016-06-22: 5 [IU] via SUBCUTANEOUS
  Filled 2016-06-22: qty 5
  Filled 2016-06-22: qty 1

## 2016-06-22 MED ORDER — INSULIN GLARGINE 100 UNIT/ML ~~LOC~~ SOLN
15.0000 [IU] | Freq: Every day | SUBCUTANEOUS | Status: DC
Start: 2016-06-22 — End: 2016-06-22
  Administered 2016-06-22: 12:00:00 15 [IU] via SUBCUTANEOUS
  Filled 2016-06-22: qty 0.15

## 2016-06-22 MED ORDER — METFORMIN HCL 1000 MG PO TABS: 1000.0000 mg | ORAL_TABLET | Freq: Two times a day (BID) | ORAL | 2 refills | Status: DC

## 2016-06-22 MED ORDER — INSULIN ASPART 100 UNIT/ML ~~LOC~~ SOLN: 0.0000 [IU] | Freq: Every day | SUBCUTANEOUS | Status: DC

## 2016-06-22 MED ORDER — CLOPIDOGREL BISULFATE 75 MG PO TABS: 75.0000 mg | ORAL_TABLET | Freq: Every day | ORAL | 2 refills | Status: AC

## 2016-06-22 MED ORDER — INSULIN GLARGINE 100 UNIT/ML ~~LOC~~ SOLN
15.0000 [IU] | Freq: Every day | SUBCUTANEOUS | Status: DC
  Filled 2016-06-22: qty 0.15

## 2016-06-22 NOTE — Plan of Care (Signed)
Problem: Nutrition: Goal: Risk of aspiration will decrease Outcome: Completed/Met Date Met: 06/22/16 Patient passed stroke swallow screen and denies any difficulty with swallowing.  Problem: Tissue Perfusion: Goal: Complications of Ischemic Stroke will be minimized (choose ONE based on patient diagnosis) Outcome: Progressing NIH = "0" ; neuro assessments WNL, no deficits noted.

## 2016-06-22 NOTE — Progress Notes (Signed)
Pt being discharged home, discharge instructions and prescriptions reviewed with pt and family, states understanding, also stressed the importance of being compliant with medications, states understanding, pt with no complaints at discharge

## 2016-06-22 NOTE — Discharge Summary (Signed)
[facility] [place] at [facility]   PATIENT NAME: Ann Price    MR#:  [id number]  DATE OF BIRTH:  [birth year]  DATE OF ADMISSION:  06/21/2016   ADMITTING PHYSICIAN: Houston Siren, MD  DATE OF DISCHARGE: 06/22/2016  PRIMARY CARE PHYSICIAN: Leotis Shames, MD   ADMISSION DIAGNOSIS:  CVA (cerebral vascular accident) (HCC) [I63.9] Left hand paresthesia [R20.2] Weakness of left hand [R29.898] DISCHARGE DIAGNOSIS:  Active Problems:   CVA (cerebral vascular accident) (HCC)  SECONDARY DIAGNOSIS:   Past Medical History:  Diagnosis Date  . Arthritis    knees  . Diabetes mellitus without complication (HCC)   . GERD (gastroesophageal reflux disease)   . Hyperlipidemia   . Hypertension   . Stroke (HCC) 2016   (TIA) no deficits  . Wears dentures    full upper, partial lower   HOSPITAL COURSE:   71 year old female with past medical history of diabetes, hypertension, osteoarthritis, previous history of CVA who presents to the hospital due to dizziness, weakness and left-sided numbness.  1. TIA, no CVA per MRI brain.  left-sided numbness and weakness improved. Continue Plavix. Add lipitor.  carotid duplex is unremarkable and echocardiogram is pending.  2. Essential hypertension-continue Norvasc, atenolol, hydralazine, HCTZ, Losartan. -Follow hemodynamics.  3. Diabetes type 2 without complication Diabetes and is not controlled. The patient said that she is taking metformin 500 mg twice a day, glipizide and Actos at home. But her blood sugar is always high more than 300. I will increase metformin to 1000 mg twice a day. Follow-up PCP for adjustment.  *Hyperlipidemia.  LDL 166. Start Lipitor 40 mg at bedtime. I advised the patient and her family about diet control and exercise.  DISCHARGE CONDITIONS:  Stable, discharge to home today. CONSULTS OBTAINED:   DRUG ALLERGIES:   Allergies  Allergen Reactions  . Aspirin Itching  . Penicillins Other (See  Comments)    Stomach burns Has patient had a PCN reaction causing immediate rash, facial/tongue/throat swelling, SOB or lightheadedness with hypotension: No Has patient had a PCN reaction causing severe rash involving mucus membranes or skin necrosis: No Has patient had a PCN reaction that required hospitalization: No Has patient had a PCN reaction occurring within the last 10 years: No If all of the above answers are "NO", then may proceed with Cephalosporin use.    DISCHARGE MEDICATIONS:   Allergies as of 06/22/2016      Reactions   Aspirin Itching   Penicillins Other (See Comments)   Stomach burns Has patient had a PCN reaction causing immediate rash, facial/tongue/throat swelling, SOB or lightheadedness with hypotension: No Has patient had a PCN reaction causing severe rash involving mucus membranes or skin necrosis: No Has patient had a PCN reaction that required hospitalization: No Has patient had a PCN reaction occurring within the last 10 years: No If all of the above answers are "NO", then may proceed with Cephalosporin use.      Medication List    TAKE these medications   amLODipine 10 MG tablet Commonly known as:  NORVASC Take 10 mg by mouth daily.   atenolol 50 MG tablet Commonly known as:  TENORMIN Take 50 mg by mouth daily.   atorvastatin 40 MG tablet Commonly known as:  LIPITOR Take 1 tablet (40 mg total) by mouth daily at 6 PM.   clopidogrel 75 MG tablet Commonly known as:  PLAVIX Take 1 tablet (75 mg total) by mouth daily.   glipiZIDE 10 MG tablet Commonly known  as:  GLUCOTROL Take 20 mg by mouth daily before breakfast.   hydrALAZINE 25 MG tablet Commonly known as:  APRESOLINE Take 25 mg by mouth daily.   hydrochlorothiazide 25 MG tablet Commonly known as:  HYDRODIURIL Take 25 mg by mouth daily.   losartan 100 MG tablet Commonly known as:  COZAAR Take 100 mg by mouth daily.   metFORMIN 1000 MG tablet Commonly known as:  GLUCOPHAGE Take 1  tablet (1,000 mg total) by mouth 2 (two) times daily with a meal.   pioglitazone 45 MG tablet Commonly known as:  ACTOS Take 45 mg by mouth daily.        DISCHARGE INSTRUCTIONS:  See AVS.  If you experience worsening of your admission symptoms, develop shortness of breath, life threatening emergency, suicidal or homicidal thoughts you must seek medical attention immediately by calling 911 or calling your MD immediately  if symptoms less severe.  You Must read complete instructions/literature along with all the possible adverse reactions/side effects for all the Medicines you take and that have been prescribed to you. Take any new Medicines after you have completely understood and accpet all the possible adverse reactions/side effects.   Please note  You were cared for by a hospitalist during your hospital stay. If you have any questions about your discharge medications or the care you received while you were in the hospital after you are discharged, you can call the unit and asked to speak with the hospitalist on call if the hospitalist that took care of you is not available. Once you are discharged, your primary care physician will handle any further medical issues. Please note that NO REFILLS for any discharge medications will be authorized once you are discharged, as it is imperative that you return to your primary care physician (or establish a relationship with a primary care physician if you do not have one) for your aftercare needs so that they can reassess your need for medications and monitor your lab values.    On the day of Discharge:  VITAL SIGNS:  Blood pressure (!) 123/92, pulse 60, temperature 98.1 F (36.7 C), temperature source Oral, resp. rate 18, height 5\' 4"  (1.626 m), weight 199 lb (90.3 kg), SpO2 98 %. PHYSICAL EXAMINATION:  GENERAL:  71 y.o.-year-old patient lying in the bed with no acute distress. Obese. EYES: Pupils equal, round, reactive to light and  accommodation. No scleral icterus. Extraocular muscles intact.  HEENT: Head atraumatic, normocephalic. Oropharynx and nasopharynx clear.  NECK:  Supple, no jugular venous distention. No thyroid enlargement, no tenderness.  LUNGS: Normal breath sounds bilaterally, no wheezing, rales,rhonchi or crepitation. No use of accessory muscles of respiration.  CARDIOVASCULAR: S1, S2 normal. No murmurs, rubs, or gallops.  ABDOMEN: Soft, non-tender, non-distended. Bowel sounds present. No organomegaly or mass.  EXTREMITIES: No pedal edema, cyanosis, or clubbing.  NEUROLOGIC: Cranial nerves II through XII are intact. Muscle strength 5/5 in all extremities. Sensation intact. Gait not checked.  PSYCHIATRIC: The patient is alert and oriented x 3.  SKIN: No obvious rash, lesion, or ulcer.  DATA REVIEW:   CBC  Recent Labs Lab 06/21/16 1352  WBC 7.5  HGB 13.1  HCT 40.2  PLT 244    Chemistries   Recent Labs Lab 06/21/16 1352  NA 135  K 3.6  CL 100*  CO2 28  GLUCOSE 304*  BUN 11  CREATININE 1.00  CALCIUM 9.3  AST 14*  ALT 14  ALKPHOS 107  BILITOT 0.7     Microbiology  Results  No results found for this or any previous visit.  RADIOLOGY:  Ct Head Wo Contrast  Result Date: 06/21/2016 CLINICAL DATA:  71 year old female with dizziness, weakness and left-sided facial numbness for the past 2 days. EXAM: CT HEAD WITHOUT CONTRAST TECHNIQUE: Contiguous axial images were obtained from the base of the skull through the vertex without intravenous contrast. COMPARISON:  Head CT 04/18/2014. FINDINGS: Brain: Old lacunar infarct in the superior aspect of the right Putamen. Mild cerebral atrophy. Patchy areas of decreased attenuation are noted throughout the deep and periventricular white matter of the cerebral hemispheres bilaterally, compatible with chronic microvascular ischemic disease. No evidence of acute infarction, hemorrhage, hydrocephalus, extra-axial collection or mass lesion/mass effect.  Vascular: No hyperdense vessel or unexpected calcification. Skull: Normal. Negative for fracture or focal lesion. Sinuses/Orbits: No acute finding. Other: None. IMPRESSION: 1. No acute intracranial abnormalities. 2. Mild cerebral atrophy with mild chronic microvascular ischemic changes and old right basal ganglia lacunar infarct, as above. Electronically Signed   By: Trudie Reed M.D.   On: 06/21/2016 14:17   Mr Brain Wo Contrast  Result Date: 06/22/2016 CLINICAL DATA:  Weakness, dizziness, and LEFT-sided numbness. Symptoms for a few days, possibly a week. History of hypertension, diabetes, and hyperlipidemia. EXAM: MRI HEAD WITHOUT CONTRAST TECHNIQUE: Multiplanar, multiecho pulse sequences of the brain and surrounding structures were obtained without intravenous contrast. COMPARISON:  MR head 04/20/2014. FINDINGS: Brain: No evidence for acute infarction, hemorrhage, mass lesion, hydrocephalus, or extra-axial fluid. Mild atrophy, not unexpected for age. Mild subcortical and periventricular T2 and FLAIR hyperintensities, likely chronic microvascular ischemic change. Chronic RIGHT caudate and deep white matter infarcts, which were acute in March 2016. Vascular: Flow voids are maintained throughout the carotid, basilar, and vertebral arteries. There are no areas of chronic hemorrhage. Skull and upper cervical spine: Unremarkable visualized calvarium, skullbase, and cervical vertebrae. Partial empty sella. Pineal and cerebellar tonsils unremarkable. No upper cervical cord lesions. Sinuses/Orbits: No orbital masses or proptosis. Globes appear symmetric. Sinuses appear well aerated, without evidence for air-fluid level. BILATERAL cataract extraction. BILATERAL inferior turbinate hypertrophy. Other: No nasopharyngeal pathology or mastoid fluid. Scalp and other visualized extracranial soft tissues grossly unremarkable. IMPRESSION: No acute intracranial findings. Mild chronic microvascular ischemic change. Chronic  RIGHT basal ganglia and deep white matter infarcts, which were acute in March 2016. Electronically Signed   By: Elsie Stain M.D.   On: 06/22/2016 13:02   US Carotid Bilateral  Result Date: 06/21/2016 CLINICAL DATA:  Stroke-like symptoms EXAM: BILATERAL CAROTID DUPLEX ULTRASOUND TECHNIQUE: Wallace Cullens scale imaging, color Doppler and duplex ultrasound were performed of bilateral carotid and vertebral arteries in the neck. COMPARISON:  None. FINDINGS: Criteria: Quantification of carotid stenosis is based on velocity parameters that correlate the residual internal carotid diameter with NASCET-based stenosis levels, using the diameter of the distal internal carotid lumen as the denominator for stenosis measurement. The following velocity measurements were obtained: RIGHT ICA:  102/24 cm/sec CCA:  71/8 cm/sec SYSTOLIC ICA/CCA RATIO:  1.45 DIASTOLIC ICA/CCA RATIO:  3.17 ECA:  58 cm/sec LEFT ICA:  84/30 cm/sec CCA:  89/16 cm/sec SYSTOLIC ICA/CCA RATIO:  0.94 DIASTOLIC ICA/CCA RATIO:  1.86 ECA:  67 cm/sec RIGHT CAROTID ARTERY: Mild intimal thickening is noted within the common carotid artery on the right. No significant plaque formation is noted. Waveforms, velocities and flow velocity ratios show no evidence of focal hemodynamically significant stenosis. \ RIGHT VERTEBRAL ARTERY:  Antegrade in nature. LEFT CAROTID ARTERY: Mild intimal thickening is noted common carotid artery. Minimal atherosclerotic  plaque in the carotid bulb is noted. The waveforms, velocities and flow velocity ratios show no evidence of focal hemodynamically significant stenosis. LEFT VERTEBRAL ARTERY:  Antegrade in nature IMPRESSION: Minimal atherosclerotic disease. No focal hemodynamically significant stenosis is noted. Electronically Signed   By: Alcide CleverMark  Lukens M.D.   On: 06/21/2016 16:59     Management plans discussed with the patient, her husband and son, and they are in agreement.  CODE STATUS: Full Code   TOTAL TIME TAKING CARE OF THIS  PATIENT: 36 minutes.    Shaune Pollackhen, Kenly Henckel M.D on 06/22/2016 at 1:14 PM  Between 7am to 6pm - Pager - [phone]  After 6pm go to [email or web address] - Social research officer, [email or web address] [email or web address]  [facility] [place] [facility]  Office  [phone]  CC: Primary care physician; Leotis ShamesSingh, Jasmine, MD   Note: This dictation was prepared with Dragon dictation along with smaller phrase technology. Any transcriptional errors that result from this process are unintentional.

## 2016-06-22 NOTE — Progress Notes (Signed)
Physical Therapy Evaluation Patient Details Name: Ann Price MRN: [id number] DOB: 02/22/1945 Today's Date: 06/22/2016   History of Present Illness  Patient is a 71 y.o. female admitted on 26 May after experiencing increased numbness in L hand, as well as weakness/dizziness for a few days. PMH includes DM, HTN, HLD, previous CVA, and OA.  Clinical Impression  Patient is a pleasant female admitted for above listed reasons. Patient previously independent in all aspects of mobility, which she demonstrates on evaluation. Denies dizziness with transitions and admits to normalized sensation bilaterally in UEs. Patient is at baseline level of function and is deemed safe to return home when she is medically ready without PT f/u.    Follow Up Recommendations No PT follow up    Equipment Recommendations  None recommended by PT    Recommendations for Other Services       Precautions / Restrictions Precautions Precautions: Fall Restrictions Weight Bearing Restrictions: No      Mobility  Bed Mobility Overal bed mobility: Independent             General bed mobility comments: Patient performs bed mobility independently.  Transfers Overall transfer level: Independent               General transfer comment: Patient moves from sit to stand and stand to sit independently.  Ambulation/Gait Ambulation/Gait assistance: Min guard Ambulation Distance (Feet): 220 Feet Assistive device: None       General Gait Details: Patient ambulates at decreased cadence with decreased trunk rotation. No LOB/dizziness.  Stairs            Wheelchair Mobility    Modified Rankin (Stroke Patients Only)       Balance Overall balance assessment: Independent                                           Pertinent Vitals/Pain Pain Assessment: No/denies pain    Home Living Family/patient expects to be discharged to:: Private residence Living Arrangements:  Spouse/significant other Available Help at Discharge: Family;Available PRN/intermittently Type of Home: House Home Access: Stairs to enter Entrance Stairs-Rails: Can reach both Entrance Stairs-Number of Steps: 4 Home Layout: One level Home Equipment: None      Prior Function Level of Independence: Independent               Hand Dominance   Dominant Hand: Right    Extremity/Trunk Assessment   Upper Extremity Assessment Upper Extremity Assessment: Overall WFL for tasks assessed    Lower Extremity Assessment Lower Extremity Assessment: Overall WFL for tasks assessed       Communication   Communication: No difficulties  Cognition Arousal/Alertness: Awake/alert Behavior During Therapy: WFL for tasks assessed/performed Overall Cognitive Status: Within Functional Limits for tasks assessed                                        General Comments      Exercises     Assessment/Plan    PT Assessment Patent does not need any further PT services  PT Problem List         PT Treatment Interventions      PT Goals (Current goals can be found in the Care Plan section)  Acute Rehab PT Goals Patient Stated Goal: "To go home"  PT Goal Formulation: With patient/family Time For Goal Achievement: 07/06/16 Potential to Achieve Goals: Good    Frequency     Barriers to discharge        Co-evaluation               AM-PAC PT "6 Clicks" Daily Activity  Outcome Measure Difficulty turning over in bed (including adjusting bedclothes, sheets and blankets)?: None Difficulty moving from lying on back to sitting on the side of the bed? : None Difficulty sitting down on and standing up from a chair with arms (e.g., wheelchair, bedside commode, etc,.)?: None Help needed moving to and from a bed to chair (including a wheelchair)?: None Help needed walking in hospital room?: None Help needed climbing 3-5 steps with a railing? : None 6 Click Score: 24     End of Session Equipment Utilized During Treatment: Gait belt Activity Tolerance: Patient tolerated treatment well Patient left: in chair;with call bell/phone within reach;with family/visitor present   PT Visit Diagnosis: Difficulty in walking, not elsewhere classified (R26.2)    Time: 1131-1146 PT Time Calculation (min) (ACUTE ONLY): 15 min   Charges:   PT Evaluation $PT Eval Low Complexity: 1 Procedure     PT G Codes:   PT G-Codes **NOT FOR INPATIENT CLASS** Functional Assessment Tool Used: AM-PAC 6 Clicks Basic Mobility;Clinical judgement Functional Limitation: Mobility: Walking and moving around Mobility: Walking and Moving Around Current Status (Z6109): At least 1 percent but less than 20 percent impaired, limited or restricted Mobility: Walking and Moving Around Goal Status [phone]): At least 1 percent but less than 20 percent impaired, limited or restricted Mobility: Walking and Moving Around Discharge Status [phone]): At least 1 percent but less than 20 percent impaired, limited or restricted     Neita Carp, PT, DPT 06/22/2016, 11:55 AM

## 2016-06-22 NOTE — Progress Notes (Signed)
*  PRELIMINARY RESULTS* Echocardiogram 2D Echocardiogram has been performed.  Ann Price 06/22/2016, 10:23 AM

## 2016-06-22 NOTE — Discharge Instructions (Signed)
Heart healthy and ADA diet. °

## 2016-06-22 NOTE — Care Management Obs Status (Signed)
MEDICARE OBSERVATION STATUS NOTIFICATION   Patient Details  Name: Ann Price MRN: [id number] Date of Birth: 10/11/1945   Medicare Observation Status Notification Given:  No (discharged within 24 hours of admission)    Julee Stoll A, RN 06/22/2016, 10:43 AM

## 2016-06-24 LAB — HEMOGLOBIN A1C: Hgb A1c MFr Bld: >15.5 % — ABNORMAL HIGH (ref 4.8–5.6)

## 2016-07-02 DIAGNOSIS — E1165 Type 2 diabetes mellitus with hyperglycemia: Secondary | ICD-10-CM | POA: Diagnosis not present

## 2016-07-02 DIAGNOSIS — I10 Essential (primary) hypertension: Secondary | ICD-10-CM | POA: Diagnosis not present

## 2016-07-02 DIAGNOSIS — N182 Chronic kidney disease, stage 2 (mild): Secondary | ICD-10-CM | POA: Diagnosis not present

## 2016-07-02 DIAGNOSIS — Z794 Long term (current) use of insulin: Secondary | ICD-10-CM | POA: Diagnosis not present

## 2016-07-02 DIAGNOSIS — E1122 Type 2 diabetes mellitus with diabetic chronic kidney disease: Secondary | ICD-10-CM | POA: Diagnosis not present

## 2016-07-02 DIAGNOSIS — E7800 Pure hypercholesterolemia, unspecified: Secondary | ICD-10-CM | POA: Diagnosis not present

## 2016-07-18 DIAGNOSIS — N182 Chronic kidney disease, stage 2 (mild): Secondary | ICD-10-CM | POA: Diagnosis not present

## 2016-07-18 DIAGNOSIS — I10 Essential (primary) hypertension: Secondary | ICD-10-CM | POA: Diagnosis not present

## 2016-07-18 DIAGNOSIS — M7989 Other specified soft tissue disorders: Secondary | ICD-10-CM | POA: Diagnosis not present

## 2016-07-18 DIAGNOSIS — E1165 Type 2 diabetes mellitus with hyperglycemia: Secondary | ICD-10-CM | POA: Diagnosis not present

## 2016-07-18 DIAGNOSIS — Z794 Long term (current) use of insulin: Secondary | ICD-10-CM | POA: Diagnosis not present

## 2016-07-18 DIAGNOSIS — E1122 Type 2 diabetes mellitus with diabetic chronic kidney disease: Secondary | ICD-10-CM | POA: Diagnosis not present

## 2016-08-14 DIAGNOSIS — E11622 Type 2 diabetes mellitus with other skin ulcer: Secondary | ICD-10-CM | POA: Diagnosis not present

## 2016-08-14 DIAGNOSIS — Z7982 Long term (current) use of aspirin: Secondary | ICD-10-CM | POA: Diagnosis not present

## 2016-08-14 DIAGNOSIS — E11621 Type 2 diabetes mellitus with foot ulcer: Secondary | ICD-10-CM | POA: Diagnosis not present

## 2016-08-14 DIAGNOSIS — Z7984 Long term (current) use of oral hypoglycemic drugs: Secondary | ICD-10-CM | POA: Diagnosis not present

## 2016-08-14 DIAGNOSIS — I10 Essential (primary) hypertension: Secondary | ICD-10-CM | POA: Diagnosis not present

## 2016-08-14 DIAGNOSIS — Z8673 Personal history of transient ischemic attack (TIA), and cerebral infarction without residual deficits: Secondary | ICD-10-CM | POA: Diagnosis not present

## 2016-08-14 DIAGNOSIS — L97319 Non-pressure chronic ulcer of right ankle with unspecified severity: Secondary | ICD-10-CM | POA: Diagnosis not present

## 2016-08-30 DIAGNOSIS — Z794 Long term (current) use of insulin: Secondary | ICD-10-CM | POA: Diagnosis not present

## 2016-08-30 DIAGNOSIS — E119 Type 2 diabetes mellitus without complications: Secondary | ICD-10-CM | POA: Diagnosis not present

## 2016-08-30 DIAGNOSIS — E1140 Type 2 diabetes mellitus with diabetic neuropathy, unspecified: Secondary | ICD-10-CM | POA: Diagnosis not present

## 2016-08-30 DIAGNOSIS — I10 Essential (primary) hypertension: Secondary | ICD-10-CM | POA: Diagnosis not present

## 2016-08-30 DIAGNOSIS — R609 Edema, unspecified: Secondary | ICD-10-CM | POA: Diagnosis not present

## 2016-08-30 DIAGNOSIS — E11622 Type 2 diabetes mellitus with other skin ulcer: Secondary | ICD-10-CM | POA: Diagnosis not present

## 2016-08-30 DIAGNOSIS — E785 Hyperlipidemia, unspecified: Secondary | ICD-10-CM | POA: Diagnosis not present

## 2016-08-30 DIAGNOSIS — M25471 Effusion, right ankle: Secondary | ICD-10-CM | POA: Diagnosis not present

## 2016-08-30 DIAGNOSIS — E11628 Type 2 diabetes mellitus with other skin complications: Secondary | ICD-10-CM | POA: Diagnosis not present

## 2016-08-30 DIAGNOSIS — I129 Hypertensive chronic kidney disease with stage 1 through stage 4 chronic kidney disease, or unspecified chronic kidney disease: Secondary | ICD-10-CM | POA: Diagnosis not present

## 2016-08-30 DIAGNOSIS — M25571 Pain in right ankle and joints of right foot: Secondary | ICD-10-CM | POA: Diagnosis not present

## 2016-08-30 DIAGNOSIS — N183 Chronic kidney disease, stage 3 (moderate): Secondary | ICD-10-CM | POA: Diagnosis not present

## 2016-08-30 DIAGNOSIS — L03115 Cellulitis of right lower limb: Secondary | ICD-10-CM | POA: Diagnosis not present

## 2016-08-30 DIAGNOSIS — M7989 Other specified soft tissue disorders: Secondary | ICD-10-CM | POA: Diagnosis not present

## 2016-08-30 DIAGNOSIS — I739 Peripheral vascular disease, unspecified: Secondary | ICD-10-CM | POA: Diagnosis not present

## 2016-08-30 DIAGNOSIS — E1122 Type 2 diabetes mellitus with diabetic chronic kidney disease: Secondary | ICD-10-CM | POA: Diagnosis not present

## 2016-08-30 DIAGNOSIS — E1165 Type 2 diabetes mellitus with hyperglycemia: Secondary | ICD-10-CM | POA: Diagnosis not present

## 2016-08-30 DIAGNOSIS — D649 Anemia, unspecified: Secondary | ICD-10-CM | POA: Diagnosis not present

## 2016-08-30 DIAGNOSIS — Z0389 Encounter for observation for other suspected diseases and conditions ruled out: Secondary | ICD-10-CM | POA: Diagnosis not present

## 2016-08-30 DIAGNOSIS — E669 Obesity, unspecified: Secondary | ICD-10-CM | POA: Diagnosis not present

## 2016-08-30 DIAGNOSIS — L97319 Non-pressure chronic ulcer of right ankle with unspecified severity: Secondary | ICD-10-CM | POA: Diagnosis not present

## 2016-08-30 DIAGNOSIS — E782 Mixed hyperlipidemia: Secondary | ICD-10-CM | POA: Diagnosis not present

## 2016-09-02 DIAGNOSIS — E1165 Type 2 diabetes mellitus with hyperglycemia: Secondary | ICD-10-CM | POA: Diagnosis not present

## 2016-09-02 DIAGNOSIS — E1122 Type 2 diabetes mellitus with diabetic chronic kidney disease: Secondary | ICD-10-CM | POA: Diagnosis not present

## 2016-09-02 DIAGNOSIS — N183 Chronic kidney disease, stage 3 (moderate): Secondary | ICD-10-CM | POA: Diagnosis not present

## 2016-09-02 DIAGNOSIS — I10 Essential (primary) hypertension: Secondary | ICD-10-CM | POA: Diagnosis not present

## 2016-09-02 DIAGNOSIS — Z794 Long term (current) use of insulin: Secondary | ICD-10-CM | POA: Diagnosis not present

## 2016-09-03 ENCOUNTER — Other Ambulatory Visit: Payer: Self-pay | Admitting: *Deleted

## 2016-09-03 NOTE — Patient Outreach (Signed)
[facility]) Care Management  09/03/2016  Ann Price 1945-10-02 [id number]  [employer] referral -recent discharge from inpatient admission from [facility] 09/01/2016:  Admission -08/4-08/06/2016 DX Cellulitis right lower leg  Call #1 to patient who was advised of reason for call & [facility] care management services. HIPPA verification received from patient.  Patient voices that she was recently discharged from [facility] in [place] with cellulitis of leg. States she also has history of diabetes, HTN, . Stroke & CKD 3. Patient voices that infection in leg is healing and that she is taking medications as instructed. States she did not have any problems getting prescription filed for prescribed antibiotic. Patient voices understanding regarding taking all of antibiotic dosage. Discussed signs of infection & patient voices understanding of need to report those symptoms to primary care provider.   Patient reports that she has seen primary care provider-Dr. Loraine Maple in [place] for hospital follow up appointment 09/02/2016. States she has changed primary care providers and this was her first appointment. Voices that she has appointment with endocrinologist scheduled for 8/24. States she has no transportation issues.   Patient voices that she will also start outpatient Diabetes classes on 8/21. States she currently takes insulin & metformin . Voices that she has glucometer & checks/records blood sugars. States she knows what to do for low blood sugars.  Patient states she has no health concerns currently &states  health needs are being met. Declines [facility] services at this time.   No transition needs have been assessed by this Associate Professor.  Plan: Will send [facility] brochure with contact information with consent of patient. Send case to care management assistant to close out.   Sherrin Daisy, RN BSN [place] Management Coordinator [facility] Care Management  [phone]

## 2016-09-04 ENCOUNTER — Encounter: Payer: Self-pay | Admitting: *Deleted

## 2016-09-16 DIAGNOSIS — I10 Essential (primary) hypertension: Secondary | ICD-10-CM | POA: Diagnosis not present

## 2016-09-16 DIAGNOSIS — L03115 Cellulitis of right lower limb: Secondary | ICD-10-CM | POA: Diagnosis not present

## 2016-09-19 DIAGNOSIS — E1129 Type 2 diabetes mellitus with other diabetic kidney complication: Secondary | ICD-10-CM | POA: Diagnosis not present

## 2016-09-19 DIAGNOSIS — L03119 Cellulitis of unspecified part of limb: Secondary | ICD-10-CM | POA: Diagnosis not present

## 2016-09-19 DIAGNOSIS — E782 Mixed hyperlipidemia: Secondary | ICD-10-CM | POA: Diagnosis not present

## 2016-09-19 DIAGNOSIS — I129 Hypertensive chronic kidney disease with stage 1 through stage 4 chronic kidney disease, or unspecified chronic kidney disease: Secondary | ICD-10-CM | POA: Diagnosis not present

## 2016-09-19 DIAGNOSIS — N183 Chronic kidney disease, stage 3 (moderate): Secondary | ICD-10-CM | POA: Diagnosis not present

## 2016-09-19 DIAGNOSIS — I10 Essential (primary) hypertension: Secondary | ICD-10-CM | POA: Diagnosis not present

## 2016-09-19 DIAGNOSIS — I6990 Unspecified sequelae of unspecified cerebrovascular disease: Secondary | ICD-10-CM | POA: Diagnosis not present

## 2016-09-19 DIAGNOSIS — E1140 Type 2 diabetes mellitus with diabetic neuropathy, unspecified: Secondary | ICD-10-CM | POA: Diagnosis not present

## 2016-09-19 DIAGNOSIS — E1122 Type 2 diabetes mellitus with diabetic chronic kidney disease: Secondary | ICD-10-CM | POA: Diagnosis not present

## 2016-09-19 DIAGNOSIS — Z880 Allergy status to penicillin: Secondary | ICD-10-CM | POA: Diagnosis not present

## 2016-09-19 DIAGNOSIS — D638 Anemia in other chronic diseases classified elsewhere: Secondary | ICD-10-CM | POA: Diagnosis not present

## 2016-10-07 DIAGNOSIS — Z23 Encounter for immunization: Secondary | ICD-10-CM | POA: Diagnosis not present

## 2016-10-07 DIAGNOSIS — M792 Neuralgia and neuritis, unspecified: Secondary | ICD-10-CM | POA: Diagnosis not present

## 2016-10-07 DIAGNOSIS — J302 Other seasonal allergic rhinitis: Secondary | ICD-10-CM | POA: Diagnosis not present

## 2016-10-07 DIAGNOSIS — I10 Essential (primary) hypertension: Secondary | ICD-10-CM | POA: Diagnosis not present

## 2016-10-09 DIAGNOSIS — E1122 Type 2 diabetes mellitus with diabetic chronic kidney disease: Secondary | ICD-10-CM | POA: Diagnosis not present

## 2016-10-09 DIAGNOSIS — L97912 Non-pressure chronic ulcer of unspecified part of right lower leg with fat layer exposed: Secondary | ICD-10-CM | POA: Diagnosis not present

## 2016-10-09 DIAGNOSIS — Z794 Long term (current) use of insulin: Secondary | ICD-10-CM | POA: Diagnosis not present

## 2016-10-09 DIAGNOSIS — R609 Edema, unspecified: Secondary | ICD-10-CM | POA: Diagnosis not present

## 2016-10-09 DIAGNOSIS — I129 Hypertensive chronic kidney disease with stage 1 through stage 4 chronic kidney disease, or unspecified chronic kidney disease: Secondary | ICD-10-CM | POA: Diagnosis not present

## 2016-10-09 DIAGNOSIS — L03115 Cellulitis of right lower limb: Secondary | ICD-10-CM | POA: Diagnosis not present

## 2016-10-09 DIAGNOSIS — L97312 Non-pressure chronic ulcer of right ankle with fat layer exposed: Secondary | ICD-10-CM | POA: Diagnosis not present

## 2016-10-09 DIAGNOSIS — E782 Mixed hyperlipidemia: Secondary | ICD-10-CM | POA: Diagnosis not present

## 2016-10-09 DIAGNOSIS — R600 Localized edema: Secondary | ICD-10-CM | POA: Diagnosis not present

## 2016-10-09 DIAGNOSIS — N183 Chronic kidney disease, stage 3 (moderate): Secondary | ICD-10-CM | POA: Diagnosis not present

## 2016-10-09 DIAGNOSIS — E11622 Type 2 diabetes mellitus with other skin ulcer: Secondary | ICD-10-CM | POA: Diagnosis not present

## 2016-10-30 DIAGNOSIS — I129 Hypertensive chronic kidney disease with stage 1 through stage 4 chronic kidney disease, or unspecified chronic kidney disease: Secondary | ICD-10-CM | POA: Diagnosis not present

## 2016-10-30 DIAGNOSIS — N183 Chronic kidney disease, stage 3 (moderate): Secondary | ICD-10-CM | POA: Diagnosis not present

## 2016-10-30 DIAGNOSIS — Z9889 Other specified postprocedural states: Secondary | ICD-10-CM | POA: Diagnosis not present

## 2016-10-30 DIAGNOSIS — L03115 Cellulitis of right lower limb: Secondary | ICD-10-CM | POA: Diagnosis not present

## 2016-10-30 DIAGNOSIS — Z8673 Personal history of transient ischemic attack (TIA), and cerebral infarction without residual deficits: Secondary | ICD-10-CM | POA: Diagnosis not present

## 2016-10-30 DIAGNOSIS — I872 Venous insufficiency (chronic) (peripheral): Secondary | ICD-10-CM | POA: Diagnosis not present

## 2016-10-30 DIAGNOSIS — E782 Mixed hyperlipidemia: Secondary | ICD-10-CM | POA: Diagnosis not present

## 2016-10-30 DIAGNOSIS — E11622 Type 2 diabetes mellitus with other skin ulcer: Secondary | ICD-10-CM | POA: Diagnosis not present

## 2016-10-30 DIAGNOSIS — L97912 Non-pressure chronic ulcer of unspecified part of right lower leg with fat layer exposed: Secondary | ICD-10-CM | POA: Diagnosis not present

## 2016-10-30 DIAGNOSIS — D539 Nutritional anemia, unspecified: Secondary | ICD-10-CM | POA: Diagnosis not present

## 2016-10-30 DIAGNOSIS — E1122 Type 2 diabetes mellitus with diabetic chronic kidney disease: Secondary | ICD-10-CM | POA: Diagnosis not present

## 2016-11-03 DIAGNOSIS — N183 Chronic kidney disease, stage 3 (moderate): Secondary | ICD-10-CM | POA: Diagnosis not present

## 2016-11-03 DIAGNOSIS — E11622 Type 2 diabetes mellitus with other skin ulcer: Secondary | ICD-10-CM | POA: Diagnosis not present

## 2016-11-03 DIAGNOSIS — E1122 Type 2 diabetes mellitus with diabetic chronic kidney disease: Secondary | ICD-10-CM | POA: Diagnosis not present

## 2016-11-03 DIAGNOSIS — L97219 Non-pressure chronic ulcer of right calf with unspecified severity: Secondary | ICD-10-CM | POA: Diagnosis not present

## 2016-11-03 DIAGNOSIS — E11621 Type 2 diabetes mellitus with foot ulcer: Secondary | ICD-10-CM | POA: Diagnosis not present

## 2016-11-03 DIAGNOSIS — Z794 Long term (current) use of insulin: Secondary | ICD-10-CM | POA: Diagnosis not present

## 2016-11-03 DIAGNOSIS — L97919 Non-pressure chronic ulcer of unspecified part of right lower leg with unspecified severity: Secondary | ICD-10-CM | POA: Diagnosis not present

## 2016-11-03 DIAGNOSIS — E782 Mixed hyperlipidemia: Secondary | ICD-10-CM | POA: Diagnosis not present

## 2016-11-03 DIAGNOSIS — L97819 Non-pressure chronic ulcer of other part of right lower leg with unspecified severity: Secondary | ICD-10-CM | POA: Diagnosis not present

## 2016-11-03 DIAGNOSIS — I129 Hypertensive chronic kidney disease with stage 1 through stage 4 chronic kidney disease, or unspecified chronic kidney disease: Secondary | ICD-10-CM | POA: Diagnosis not present

## 2016-11-03 DIAGNOSIS — D631 Anemia in chronic kidney disease: Secondary | ICD-10-CM | POA: Diagnosis not present

## 2016-11-06 DIAGNOSIS — T148XXA Other injury of unspecified body region, initial encounter: Secondary | ICD-10-CM | POA: Diagnosis not present

## 2016-11-06 DIAGNOSIS — L97919 Non-pressure chronic ulcer of unspecified part of right lower leg with unspecified severity: Secondary | ICD-10-CM | POA: Diagnosis not present

## 2016-11-24 DIAGNOSIS — L97912 Non-pressure chronic ulcer of unspecified part of right lower leg with fat layer exposed: Secondary | ICD-10-CM | POA: Diagnosis not present

## 2016-11-24 DIAGNOSIS — E11622 Type 2 diabetes mellitus with other skin ulcer: Secondary | ICD-10-CM | POA: Diagnosis not present

## 2016-11-26 DIAGNOSIS — Z8673 Personal history of transient ischemic attack (TIA), and cerebral infarction without residual deficits: Secondary | ICD-10-CM | POA: Diagnosis not present

## 2016-11-26 DIAGNOSIS — L97312 Non-pressure chronic ulcer of right ankle with fat layer exposed: Secondary | ICD-10-CM | POA: Diagnosis not present

## 2016-11-26 DIAGNOSIS — D631 Anemia in chronic kidney disease: Secondary | ICD-10-CM | POA: Diagnosis not present

## 2016-11-26 DIAGNOSIS — E1122 Type 2 diabetes mellitus with diabetic chronic kidney disease: Secondary | ICD-10-CM | POA: Diagnosis not present

## 2016-11-26 DIAGNOSIS — Z794 Long term (current) use of insulin: Secondary | ICD-10-CM | POA: Diagnosis not present

## 2016-11-26 DIAGNOSIS — I129 Hypertensive chronic kidney disease with stage 1 through stage 4 chronic kidney disease, or unspecified chronic kidney disease: Secondary | ICD-10-CM | POA: Diagnosis not present

## 2016-11-26 DIAGNOSIS — E11622 Type 2 diabetes mellitus with other skin ulcer: Secondary | ICD-10-CM | POA: Diagnosis not present

## 2016-11-26 DIAGNOSIS — N183 Chronic kidney disease, stage 3 (moderate): Secondary | ICD-10-CM | POA: Diagnosis not present

## 2016-11-26 DIAGNOSIS — Z7902 Long term (current) use of antithrombotics/antiplatelets: Secondary | ICD-10-CM | POA: Diagnosis not present

## 2016-11-28 DIAGNOSIS — L97312 Non-pressure chronic ulcer of right ankle with fat layer exposed: Secondary | ICD-10-CM | POA: Diagnosis not present

## 2016-11-28 DIAGNOSIS — E1122 Type 2 diabetes mellitus with diabetic chronic kidney disease: Secondary | ICD-10-CM | POA: Diagnosis not present

## 2016-11-28 DIAGNOSIS — N183 Chronic kidney disease, stage 3 (moderate): Secondary | ICD-10-CM | POA: Diagnosis not present

## 2016-11-28 DIAGNOSIS — Z794 Long term (current) use of insulin: Secondary | ICD-10-CM | POA: Diagnosis not present

## 2016-11-28 DIAGNOSIS — E11622 Type 2 diabetes mellitus with other skin ulcer: Secondary | ICD-10-CM | POA: Diagnosis not present

## 2016-11-28 DIAGNOSIS — Z7902 Long term (current) use of antithrombotics/antiplatelets: Secondary | ICD-10-CM | POA: Diagnosis not present

## 2016-11-28 DIAGNOSIS — Z8673 Personal history of transient ischemic attack (TIA), and cerebral infarction without residual deficits: Secondary | ICD-10-CM | POA: Diagnosis not present

## 2016-11-28 DIAGNOSIS — I129 Hypertensive chronic kidney disease with stage 1 through stage 4 chronic kidney disease, or unspecified chronic kidney disease: Secondary | ICD-10-CM | POA: Diagnosis not present

## 2016-11-28 DIAGNOSIS — D631 Anemia in chronic kidney disease: Secondary | ICD-10-CM | POA: Diagnosis not present

## 2016-12-01 DIAGNOSIS — E1122 Type 2 diabetes mellitus with diabetic chronic kidney disease: Secondary | ICD-10-CM | POA: Diagnosis not present

## 2016-12-01 DIAGNOSIS — N183 Chronic kidney disease, stage 3 (moderate): Secondary | ICD-10-CM | POA: Diagnosis not present

## 2016-12-01 DIAGNOSIS — I129 Hypertensive chronic kidney disease with stage 1 through stage 4 chronic kidney disease, or unspecified chronic kidney disease: Secondary | ICD-10-CM | POA: Diagnosis not present

## 2016-12-01 DIAGNOSIS — Z7902 Long term (current) use of antithrombotics/antiplatelets: Secondary | ICD-10-CM | POA: Diagnosis not present

## 2016-12-01 DIAGNOSIS — E11622 Type 2 diabetes mellitus with other skin ulcer: Secondary | ICD-10-CM | POA: Diagnosis not present

## 2016-12-01 DIAGNOSIS — D631 Anemia in chronic kidney disease: Secondary | ICD-10-CM | POA: Diagnosis not present

## 2016-12-01 DIAGNOSIS — Z794 Long term (current) use of insulin: Secondary | ICD-10-CM | POA: Diagnosis not present

## 2016-12-01 DIAGNOSIS — Z8673 Personal history of transient ischemic attack (TIA), and cerebral infarction without residual deficits: Secondary | ICD-10-CM | POA: Diagnosis not present

## 2016-12-01 DIAGNOSIS — L97312 Non-pressure chronic ulcer of right ankle with fat layer exposed: Secondary | ICD-10-CM | POA: Diagnosis not present

## 2016-12-02 DIAGNOSIS — E1122 Type 2 diabetes mellitus with diabetic chronic kidney disease: Secondary | ICD-10-CM | POA: Diagnosis not present

## 2016-12-02 DIAGNOSIS — M7989 Other specified soft tissue disorders: Secondary | ICD-10-CM | POA: Diagnosis not present

## 2016-12-02 DIAGNOSIS — L97919 Non-pressure chronic ulcer of unspecified part of right lower leg with unspecified severity: Secondary | ICD-10-CM | POA: Diagnosis not present

## 2016-12-02 DIAGNOSIS — Z7902 Long term (current) use of antithrombotics/antiplatelets: Secondary | ICD-10-CM | POA: Diagnosis not present

## 2016-12-02 DIAGNOSIS — L97511 Non-pressure chronic ulcer of other part of right foot limited to breakdown of skin: Secondary | ICD-10-CM | POA: Diagnosis not present

## 2016-12-02 DIAGNOSIS — M79661 Pain in right lower leg: Secondary | ICD-10-CM | POA: Diagnosis not present

## 2016-12-02 DIAGNOSIS — E11621 Type 2 diabetes mellitus with foot ulcer: Secondary | ICD-10-CM | POA: Diagnosis not present

## 2016-12-02 DIAGNOSIS — L97309 Non-pressure chronic ulcer of unspecified ankle with unspecified severity: Secondary | ICD-10-CM | POA: Diagnosis not present

## 2016-12-02 DIAGNOSIS — I129 Hypertensive chronic kidney disease with stage 1 through stage 4 chronic kidney disease, or unspecified chronic kidney disease: Secondary | ICD-10-CM | POA: Diagnosis not present

## 2016-12-02 DIAGNOSIS — Z794 Long term (current) use of insulin: Secondary | ICD-10-CM | POA: Diagnosis not present

## 2016-12-02 DIAGNOSIS — Z79899 Other long term (current) drug therapy: Secondary | ICD-10-CM | POA: Diagnosis not present

## 2016-12-03 DIAGNOSIS — Z794 Long term (current) use of insulin: Secondary | ICD-10-CM | POA: Diagnosis not present

## 2016-12-03 DIAGNOSIS — Z7902 Long term (current) use of antithrombotics/antiplatelets: Secondary | ICD-10-CM | POA: Diagnosis not present

## 2016-12-03 DIAGNOSIS — E11622 Type 2 diabetes mellitus with other skin ulcer: Secondary | ICD-10-CM | POA: Diagnosis not present

## 2016-12-03 DIAGNOSIS — E1122 Type 2 diabetes mellitus with diabetic chronic kidney disease: Secondary | ICD-10-CM | POA: Diagnosis not present

## 2016-12-03 DIAGNOSIS — Z8673 Personal history of transient ischemic attack (TIA), and cerebral infarction without residual deficits: Secondary | ICD-10-CM | POA: Diagnosis not present

## 2016-12-03 DIAGNOSIS — D631 Anemia in chronic kidney disease: Secondary | ICD-10-CM | POA: Diagnosis not present

## 2016-12-03 DIAGNOSIS — L97309 Non-pressure chronic ulcer of unspecified ankle with unspecified severity: Secondary | ICD-10-CM | POA: Diagnosis not present

## 2016-12-03 DIAGNOSIS — I129 Hypertensive chronic kidney disease with stage 1 through stage 4 chronic kidney disease, or unspecified chronic kidney disease: Secondary | ICD-10-CM | POA: Diagnosis not present

## 2016-12-03 DIAGNOSIS — N183 Chronic kidney disease, stage 3 (moderate): Secondary | ICD-10-CM | POA: Diagnosis not present

## 2016-12-03 DIAGNOSIS — L97312 Non-pressure chronic ulcer of right ankle with fat layer exposed: Secondary | ICD-10-CM | POA: Diagnosis not present

## 2016-12-05 DIAGNOSIS — Z7902 Long term (current) use of antithrombotics/antiplatelets: Secondary | ICD-10-CM | POA: Diagnosis not present

## 2016-12-05 DIAGNOSIS — E11622 Type 2 diabetes mellitus with other skin ulcer: Secondary | ICD-10-CM | POA: Diagnosis not present

## 2016-12-05 DIAGNOSIS — D631 Anemia in chronic kidney disease: Secondary | ICD-10-CM | POA: Diagnosis not present

## 2016-12-05 DIAGNOSIS — N183 Chronic kidney disease, stage 3 (moderate): Secondary | ICD-10-CM | POA: Diagnosis not present

## 2016-12-05 DIAGNOSIS — E1122 Type 2 diabetes mellitus with diabetic chronic kidney disease: Secondary | ICD-10-CM | POA: Diagnosis not present

## 2016-12-05 DIAGNOSIS — I129 Hypertensive chronic kidney disease with stage 1 through stage 4 chronic kidney disease, or unspecified chronic kidney disease: Secondary | ICD-10-CM | POA: Diagnosis not present

## 2016-12-05 DIAGNOSIS — L97312 Non-pressure chronic ulcer of right ankle with fat layer exposed: Secondary | ICD-10-CM | POA: Diagnosis not present

## 2016-12-05 DIAGNOSIS — Z794 Long term (current) use of insulin: Secondary | ICD-10-CM | POA: Diagnosis not present

## 2016-12-05 DIAGNOSIS — Z8673 Personal history of transient ischemic attack (TIA), and cerebral infarction without residual deficits: Secondary | ICD-10-CM | POA: Diagnosis not present

## 2016-12-08 DIAGNOSIS — I129 Hypertensive chronic kidney disease with stage 1 through stage 4 chronic kidney disease, or unspecified chronic kidney disease: Secondary | ICD-10-CM | POA: Diagnosis not present

## 2016-12-08 DIAGNOSIS — N183 Chronic kidney disease, stage 3 (moderate): Secondary | ICD-10-CM | POA: Diagnosis not present

## 2016-12-08 DIAGNOSIS — E11622 Type 2 diabetes mellitus with other skin ulcer: Secondary | ICD-10-CM | POA: Diagnosis not present

## 2016-12-08 DIAGNOSIS — Z794 Long term (current) use of insulin: Secondary | ICD-10-CM | POA: Diagnosis not present

## 2016-12-08 DIAGNOSIS — L97312 Non-pressure chronic ulcer of right ankle with fat layer exposed: Secondary | ICD-10-CM | POA: Diagnosis not present

## 2016-12-08 DIAGNOSIS — D631 Anemia in chronic kidney disease: Secondary | ICD-10-CM | POA: Diagnosis not present

## 2016-12-08 DIAGNOSIS — Z8673 Personal history of transient ischemic attack (TIA), and cerebral infarction without residual deficits: Secondary | ICD-10-CM | POA: Diagnosis not present

## 2016-12-08 DIAGNOSIS — E1122 Type 2 diabetes mellitus with diabetic chronic kidney disease: Secondary | ICD-10-CM | POA: Diagnosis not present

## 2016-12-08 DIAGNOSIS — Z7902 Long term (current) use of antithrombotics/antiplatelets: Secondary | ICD-10-CM | POA: Diagnosis not present

## 2016-12-09 DIAGNOSIS — E785 Hyperlipidemia, unspecified: Secondary | ICD-10-CM | POA: Diagnosis not present

## 2016-12-09 DIAGNOSIS — Z794 Long term (current) use of insulin: Secondary | ICD-10-CM | POA: Diagnosis not present

## 2016-12-09 DIAGNOSIS — I10 Essential (primary) hypertension: Secondary | ICD-10-CM | POA: Diagnosis not present

## 2016-12-09 DIAGNOSIS — Z79899 Other long term (current) drug therapy: Secondary | ICD-10-CM | POA: Diagnosis not present

## 2016-12-09 DIAGNOSIS — N183 Chronic kidney disease, stage 3 (moderate): Secondary | ICD-10-CM | POA: Diagnosis not present

## 2016-12-09 DIAGNOSIS — T148XXA Other injury of unspecified body region, initial encounter: Secondary | ICD-10-CM | POA: Diagnosis not present

## 2016-12-09 DIAGNOSIS — E1165 Type 2 diabetes mellitus with hyperglycemia: Secondary | ICD-10-CM | POA: Diagnosis not present

## 2016-12-09 DIAGNOSIS — Z8673 Personal history of transient ischemic attack (TIA), and cerebral infarction without residual deficits: Secondary | ICD-10-CM | POA: Diagnosis not present

## 2016-12-09 DIAGNOSIS — L03119 Cellulitis of unspecified part of limb: Secondary | ICD-10-CM | POA: Diagnosis not present

## 2016-12-09 DIAGNOSIS — I129 Hypertensive chronic kidney disease with stage 1 through stage 4 chronic kidney disease, or unspecified chronic kidney disease: Secondary | ICD-10-CM | POA: Diagnosis not present

## 2016-12-09 DIAGNOSIS — Z9849 Cataract extraction status, unspecified eye: Secondary | ICD-10-CM | POA: Diagnosis not present

## 2016-12-09 DIAGNOSIS — E1122 Type 2 diabetes mellitus with diabetic chronic kidney disease: Secondary | ICD-10-CM | POA: Diagnosis not present

## 2016-12-10 DIAGNOSIS — Z7902 Long term (current) use of antithrombotics/antiplatelets: Secondary | ICD-10-CM | POA: Diagnosis not present

## 2016-12-10 DIAGNOSIS — N183 Chronic kidney disease, stage 3 (moderate): Secondary | ICD-10-CM | POA: Diagnosis not present

## 2016-12-10 DIAGNOSIS — D631 Anemia in chronic kidney disease: Secondary | ICD-10-CM | POA: Diagnosis not present

## 2016-12-10 DIAGNOSIS — E1122 Type 2 diabetes mellitus with diabetic chronic kidney disease: Secondary | ICD-10-CM | POA: Diagnosis not present

## 2016-12-10 DIAGNOSIS — L97312 Non-pressure chronic ulcer of right ankle with fat layer exposed: Secondary | ICD-10-CM | POA: Diagnosis not present

## 2016-12-10 DIAGNOSIS — I129 Hypertensive chronic kidney disease with stage 1 through stage 4 chronic kidney disease, or unspecified chronic kidney disease: Secondary | ICD-10-CM | POA: Diagnosis not present

## 2016-12-10 DIAGNOSIS — Z8673 Personal history of transient ischemic attack (TIA), and cerebral infarction without residual deficits: Secondary | ICD-10-CM | POA: Diagnosis not present

## 2016-12-10 DIAGNOSIS — Z794 Long term (current) use of insulin: Secondary | ICD-10-CM | POA: Diagnosis not present

## 2016-12-10 DIAGNOSIS — E11622 Type 2 diabetes mellitus with other skin ulcer: Secondary | ICD-10-CM | POA: Diagnosis not present

## 2016-12-12 DIAGNOSIS — N183 Chronic kidney disease, stage 3 (moderate): Secondary | ICD-10-CM | POA: Diagnosis not present

## 2016-12-12 DIAGNOSIS — L97312 Non-pressure chronic ulcer of right ankle with fat layer exposed: Secondary | ICD-10-CM | POA: Diagnosis not present

## 2016-12-12 DIAGNOSIS — D631 Anemia in chronic kidney disease: Secondary | ICD-10-CM | POA: Diagnosis not present

## 2016-12-12 DIAGNOSIS — E11622 Type 2 diabetes mellitus with other skin ulcer: Secondary | ICD-10-CM | POA: Diagnosis not present

## 2016-12-12 DIAGNOSIS — Z7902 Long term (current) use of antithrombotics/antiplatelets: Secondary | ICD-10-CM | POA: Diagnosis not present

## 2016-12-12 DIAGNOSIS — Z8673 Personal history of transient ischemic attack (TIA), and cerebral infarction without residual deficits: Secondary | ICD-10-CM | POA: Diagnosis not present

## 2016-12-12 DIAGNOSIS — Z794 Long term (current) use of insulin: Secondary | ICD-10-CM | POA: Diagnosis not present

## 2016-12-12 DIAGNOSIS — E1122 Type 2 diabetes mellitus with diabetic chronic kidney disease: Secondary | ICD-10-CM | POA: Diagnosis not present

## 2016-12-12 DIAGNOSIS — I129 Hypertensive chronic kidney disease with stage 1 through stage 4 chronic kidney disease, or unspecified chronic kidney disease: Secondary | ICD-10-CM | POA: Diagnosis not present

## 2016-12-15 DIAGNOSIS — L97312 Non-pressure chronic ulcer of right ankle with fat layer exposed: Secondary | ICD-10-CM | POA: Diagnosis not present

## 2016-12-15 DIAGNOSIS — Z8673 Personal history of transient ischemic attack (TIA), and cerebral infarction without residual deficits: Secondary | ICD-10-CM | POA: Diagnosis not present

## 2016-12-15 DIAGNOSIS — I129 Hypertensive chronic kidney disease with stage 1 through stage 4 chronic kidney disease, or unspecified chronic kidney disease: Secondary | ICD-10-CM | POA: Diagnosis not present

## 2016-12-15 DIAGNOSIS — E1122 Type 2 diabetes mellitus with diabetic chronic kidney disease: Secondary | ICD-10-CM | POA: Diagnosis not present

## 2016-12-15 DIAGNOSIS — Z7902 Long term (current) use of antithrombotics/antiplatelets: Secondary | ICD-10-CM | POA: Diagnosis not present

## 2016-12-15 DIAGNOSIS — E11622 Type 2 diabetes mellitus with other skin ulcer: Secondary | ICD-10-CM | POA: Diagnosis not present

## 2016-12-15 DIAGNOSIS — D631 Anemia in chronic kidney disease: Secondary | ICD-10-CM | POA: Diagnosis not present

## 2016-12-15 DIAGNOSIS — Z794 Long term (current) use of insulin: Secondary | ICD-10-CM | POA: Diagnosis not present

## 2016-12-15 DIAGNOSIS — N183 Chronic kidney disease, stage 3 (moderate): Secondary | ICD-10-CM | POA: Diagnosis not present

## 2016-12-17 DIAGNOSIS — Z8673 Personal history of transient ischemic attack (TIA), and cerebral infarction without residual deficits: Secondary | ICD-10-CM | POA: Diagnosis not present

## 2016-12-17 DIAGNOSIS — L97312 Non-pressure chronic ulcer of right ankle with fat layer exposed: Secondary | ICD-10-CM | POA: Diagnosis not present

## 2016-12-17 DIAGNOSIS — I129 Hypertensive chronic kidney disease with stage 1 through stage 4 chronic kidney disease, or unspecified chronic kidney disease: Secondary | ICD-10-CM | POA: Diagnosis not present

## 2016-12-17 DIAGNOSIS — Z794 Long term (current) use of insulin: Secondary | ICD-10-CM | POA: Diagnosis not present

## 2016-12-17 DIAGNOSIS — E11622 Type 2 diabetes mellitus with other skin ulcer: Secondary | ICD-10-CM | POA: Diagnosis not present

## 2016-12-17 DIAGNOSIS — D631 Anemia in chronic kidney disease: Secondary | ICD-10-CM | POA: Diagnosis not present

## 2016-12-17 DIAGNOSIS — Z7902 Long term (current) use of antithrombotics/antiplatelets: Secondary | ICD-10-CM | POA: Diagnosis not present

## 2016-12-17 DIAGNOSIS — E1122 Type 2 diabetes mellitus with diabetic chronic kidney disease: Secondary | ICD-10-CM | POA: Diagnosis not present

## 2016-12-17 DIAGNOSIS — N183 Chronic kidney disease, stage 3 (moderate): Secondary | ICD-10-CM | POA: Diagnosis not present

## 2016-12-22 DIAGNOSIS — I129 Hypertensive chronic kidney disease with stage 1 through stage 4 chronic kidney disease, or unspecified chronic kidney disease: Secondary | ICD-10-CM | POA: Diagnosis not present

## 2016-12-22 DIAGNOSIS — E1122 Type 2 diabetes mellitus with diabetic chronic kidney disease: Secondary | ICD-10-CM | POA: Diagnosis not present

## 2016-12-22 DIAGNOSIS — L97312 Non-pressure chronic ulcer of right ankle with fat layer exposed: Secondary | ICD-10-CM | POA: Diagnosis not present

## 2016-12-22 DIAGNOSIS — Z794 Long term (current) use of insulin: Secondary | ICD-10-CM | POA: Diagnosis not present

## 2016-12-22 DIAGNOSIS — N183 Chronic kidney disease, stage 3 (moderate): Secondary | ICD-10-CM | POA: Diagnosis not present

## 2016-12-22 DIAGNOSIS — Z8673 Personal history of transient ischemic attack (TIA), and cerebral infarction without residual deficits: Secondary | ICD-10-CM | POA: Diagnosis not present

## 2016-12-22 DIAGNOSIS — E11622 Type 2 diabetes mellitus with other skin ulcer: Secondary | ICD-10-CM | POA: Diagnosis not present

## 2016-12-22 DIAGNOSIS — Z7902 Long term (current) use of antithrombotics/antiplatelets: Secondary | ICD-10-CM | POA: Diagnosis not present

## 2016-12-22 DIAGNOSIS — D631 Anemia in chronic kidney disease: Secondary | ICD-10-CM | POA: Diagnosis not present

## 2016-12-23 DIAGNOSIS — E11622 Type 2 diabetes mellitus with other skin ulcer: Secondary | ICD-10-CM | POA: Diagnosis not present

## 2016-12-23 DIAGNOSIS — L97212 Non-pressure chronic ulcer of right calf with fat layer exposed: Secondary | ICD-10-CM | POA: Diagnosis not present

## 2016-12-23 DIAGNOSIS — R600 Localized edema: Secondary | ICD-10-CM | POA: Diagnosis not present

## 2016-12-23 DIAGNOSIS — E1152 Type 2 diabetes mellitus with diabetic peripheral angiopathy with gangrene: Secondary | ICD-10-CM | POA: Diagnosis not present

## 2016-12-23 DIAGNOSIS — L97912 Non-pressure chronic ulcer of unspecified part of right lower leg with fat layer exposed: Secondary | ICD-10-CM | POA: Diagnosis not present

## 2016-12-24 DIAGNOSIS — L97312 Non-pressure chronic ulcer of right ankle with fat layer exposed: Secondary | ICD-10-CM | POA: Diagnosis not present

## 2016-12-24 DIAGNOSIS — Z7902 Long term (current) use of antithrombotics/antiplatelets: Secondary | ICD-10-CM | POA: Diagnosis not present

## 2016-12-24 DIAGNOSIS — Z794 Long term (current) use of insulin: Secondary | ICD-10-CM | POA: Diagnosis not present

## 2016-12-24 DIAGNOSIS — I129 Hypertensive chronic kidney disease with stage 1 through stage 4 chronic kidney disease, or unspecified chronic kidney disease: Secondary | ICD-10-CM | POA: Diagnosis not present

## 2016-12-24 DIAGNOSIS — D631 Anemia in chronic kidney disease: Secondary | ICD-10-CM | POA: Diagnosis not present

## 2016-12-24 DIAGNOSIS — Z8673 Personal history of transient ischemic attack (TIA), and cerebral infarction without residual deficits: Secondary | ICD-10-CM | POA: Diagnosis not present

## 2016-12-24 DIAGNOSIS — E11622 Type 2 diabetes mellitus with other skin ulcer: Secondary | ICD-10-CM | POA: Diagnosis not present

## 2016-12-24 DIAGNOSIS — N183 Chronic kidney disease, stage 3 (moderate): Secondary | ICD-10-CM | POA: Diagnosis not present

## 2016-12-24 DIAGNOSIS — E1122 Type 2 diabetes mellitus with diabetic chronic kidney disease: Secondary | ICD-10-CM | POA: Diagnosis not present

## 2016-12-26 DIAGNOSIS — Z794 Long term (current) use of insulin: Secondary | ICD-10-CM | POA: Diagnosis not present

## 2016-12-26 DIAGNOSIS — N183 Chronic kidney disease, stage 3 (moderate): Secondary | ICD-10-CM | POA: Diagnosis not present

## 2016-12-26 DIAGNOSIS — Z8673 Personal history of transient ischemic attack (TIA), and cerebral infarction without residual deficits: Secondary | ICD-10-CM | POA: Diagnosis not present

## 2016-12-26 DIAGNOSIS — E1122 Type 2 diabetes mellitus with diabetic chronic kidney disease: Secondary | ICD-10-CM | POA: Diagnosis not present

## 2016-12-26 DIAGNOSIS — I129 Hypertensive chronic kidney disease with stage 1 through stage 4 chronic kidney disease, or unspecified chronic kidney disease: Secondary | ICD-10-CM | POA: Diagnosis not present

## 2016-12-26 DIAGNOSIS — Z7902 Long term (current) use of antithrombotics/antiplatelets: Secondary | ICD-10-CM | POA: Diagnosis not present

## 2016-12-26 DIAGNOSIS — D631 Anemia in chronic kidney disease: Secondary | ICD-10-CM | POA: Diagnosis not present

## 2016-12-26 DIAGNOSIS — L97312 Non-pressure chronic ulcer of right ankle with fat layer exposed: Secondary | ICD-10-CM | POA: Diagnosis not present

## 2016-12-26 DIAGNOSIS — E11622 Type 2 diabetes mellitus with other skin ulcer: Secondary | ICD-10-CM | POA: Diagnosis not present

## 2016-12-29 DIAGNOSIS — I129 Hypertensive chronic kidney disease with stage 1 through stage 4 chronic kidney disease, or unspecified chronic kidney disease: Secondary | ICD-10-CM | POA: Diagnosis not present

## 2016-12-29 DIAGNOSIS — Z794 Long term (current) use of insulin: Secondary | ICD-10-CM | POA: Diagnosis not present

## 2016-12-29 DIAGNOSIS — D631 Anemia in chronic kidney disease: Secondary | ICD-10-CM | POA: Diagnosis not present

## 2016-12-29 DIAGNOSIS — E1122 Type 2 diabetes mellitus with diabetic chronic kidney disease: Secondary | ICD-10-CM | POA: Diagnosis not present

## 2016-12-29 DIAGNOSIS — Z7902 Long term (current) use of antithrombotics/antiplatelets: Secondary | ICD-10-CM | POA: Diagnosis not present

## 2016-12-29 DIAGNOSIS — N183 Chronic kidney disease, stage 3 (moderate): Secondary | ICD-10-CM | POA: Diagnosis not present

## 2016-12-29 DIAGNOSIS — Z8673 Personal history of transient ischemic attack (TIA), and cerebral infarction without residual deficits: Secondary | ICD-10-CM | POA: Diagnosis not present

## 2016-12-29 DIAGNOSIS — L97312 Non-pressure chronic ulcer of right ankle with fat layer exposed: Secondary | ICD-10-CM | POA: Diagnosis not present

## 2016-12-29 DIAGNOSIS — E11622 Type 2 diabetes mellitus with other skin ulcer: Secondary | ICD-10-CM | POA: Diagnosis not present

## 2016-12-30 DIAGNOSIS — E1122 Type 2 diabetes mellitus with diabetic chronic kidney disease: Secondary | ICD-10-CM | POA: Diagnosis not present

## 2016-12-30 DIAGNOSIS — Z7902 Long term (current) use of antithrombotics/antiplatelets: Secondary | ICD-10-CM | POA: Diagnosis not present

## 2016-12-30 DIAGNOSIS — Z794 Long term (current) use of insulin: Secondary | ICD-10-CM | POA: Diagnosis not present

## 2016-12-30 DIAGNOSIS — E11622 Type 2 diabetes mellitus with other skin ulcer: Secondary | ICD-10-CM | POA: Diagnosis not present

## 2016-12-30 DIAGNOSIS — L97312 Non-pressure chronic ulcer of right ankle with fat layer exposed: Secondary | ICD-10-CM | POA: Diagnosis not present

## 2016-12-30 DIAGNOSIS — N183 Chronic kidney disease, stage 3 (moderate): Secondary | ICD-10-CM | POA: Diagnosis not present

## 2016-12-30 DIAGNOSIS — Z8673 Personal history of transient ischemic attack (TIA), and cerebral infarction without residual deficits: Secondary | ICD-10-CM | POA: Diagnosis not present

## 2016-12-30 DIAGNOSIS — D631 Anemia in chronic kidney disease: Secondary | ICD-10-CM | POA: Diagnosis not present

## 2016-12-30 DIAGNOSIS — I129 Hypertensive chronic kidney disease with stage 1 through stage 4 chronic kidney disease, or unspecified chronic kidney disease: Secondary | ICD-10-CM | POA: Diagnosis not present

## 2017-01-02 DIAGNOSIS — I129 Hypertensive chronic kidney disease with stage 1 through stage 4 chronic kidney disease, or unspecified chronic kidney disease: Secondary | ICD-10-CM | POA: Diagnosis not present

## 2017-01-02 DIAGNOSIS — L97312 Non-pressure chronic ulcer of right ankle with fat layer exposed: Secondary | ICD-10-CM | POA: Diagnosis not present

## 2017-01-02 DIAGNOSIS — E1122 Type 2 diabetes mellitus with diabetic chronic kidney disease: Secondary | ICD-10-CM | POA: Diagnosis not present

## 2017-01-02 DIAGNOSIS — N183 Chronic kidney disease, stage 3 (moderate): Secondary | ICD-10-CM | POA: Diagnosis not present

## 2017-01-02 DIAGNOSIS — Z794 Long term (current) use of insulin: Secondary | ICD-10-CM | POA: Diagnosis not present

## 2017-01-02 DIAGNOSIS — E11622 Type 2 diabetes mellitus with other skin ulcer: Secondary | ICD-10-CM | POA: Diagnosis not present

## 2017-01-02 DIAGNOSIS — Z7902 Long term (current) use of antithrombotics/antiplatelets: Secondary | ICD-10-CM | POA: Diagnosis not present

## 2017-01-02 DIAGNOSIS — D631 Anemia in chronic kidney disease: Secondary | ICD-10-CM | POA: Diagnosis not present

## 2017-01-02 DIAGNOSIS — Z8673 Personal history of transient ischemic attack (TIA), and cerebral infarction without residual deficits: Secondary | ICD-10-CM | POA: Diagnosis not present

## 2017-01-08 DIAGNOSIS — T148XXA Other injury of unspecified body region, initial encounter: Secondary | ICD-10-CM | POA: Diagnosis not present

## 2017-01-09 DIAGNOSIS — I129 Hypertensive chronic kidney disease with stage 1 through stage 4 chronic kidney disease, or unspecified chronic kidney disease: Secondary | ICD-10-CM | POA: Diagnosis not present

## 2017-01-09 DIAGNOSIS — L97312 Non-pressure chronic ulcer of right ankle with fat layer exposed: Secondary | ICD-10-CM | POA: Diagnosis not present

## 2017-01-09 DIAGNOSIS — E11622 Type 2 diabetes mellitus with other skin ulcer: Secondary | ICD-10-CM | POA: Diagnosis not present

## 2017-01-09 DIAGNOSIS — D631 Anemia in chronic kidney disease: Secondary | ICD-10-CM | POA: Diagnosis not present

## 2017-01-09 DIAGNOSIS — E1122 Type 2 diabetes mellitus with diabetic chronic kidney disease: Secondary | ICD-10-CM | POA: Diagnosis not present

## 2017-01-09 DIAGNOSIS — Z7902 Long term (current) use of antithrombotics/antiplatelets: Secondary | ICD-10-CM | POA: Diagnosis not present

## 2017-01-09 DIAGNOSIS — N183 Chronic kidney disease, stage 3 (moderate): Secondary | ICD-10-CM | POA: Diagnosis not present

## 2017-01-09 DIAGNOSIS — Z794 Long term (current) use of insulin: Secondary | ICD-10-CM | POA: Diagnosis not present

## 2017-01-09 DIAGNOSIS — Z8673 Personal history of transient ischemic attack (TIA), and cerebral infarction without residual deficits: Secondary | ICD-10-CM | POA: Diagnosis not present

## 2017-01-12 DIAGNOSIS — Z794 Long term (current) use of insulin: Secondary | ICD-10-CM | POA: Diagnosis not present

## 2017-01-12 DIAGNOSIS — Z8673 Personal history of transient ischemic attack (TIA), and cerebral infarction without residual deficits: Secondary | ICD-10-CM | POA: Diagnosis not present

## 2017-01-12 DIAGNOSIS — E1122 Type 2 diabetes mellitus with diabetic chronic kidney disease: Secondary | ICD-10-CM | POA: Diagnosis not present

## 2017-01-12 DIAGNOSIS — I129 Hypertensive chronic kidney disease with stage 1 through stage 4 chronic kidney disease, or unspecified chronic kidney disease: Secondary | ICD-10-CM | POA: Diagnosis not present

## 2017-01-12 DIAGNOSIS — D631 Anemia in chronic kidney disease: Secondary | ICD-10-CM | POA: Diagnosis not present

## 2017-01-12 DIAGNOSIS — N183 Chronic kidney disease, stage 3 (moderate): Secondary | ICD-10-CM | POA: Diagnosis not present

## 2017-01-12 DIAGNOSIS — Z7902 Long term (current) use of antithrombotics/antiplatelets: Secondary | ICD-10-CM | POA: Diagnosis not present

## 2017-01-12 DIAGNOSIS — L97312 Non-pressure chronic ulcer of right ankle with fat layer exposed: Secondary | ICD-10-CM | POA: Diagnosis not present

## 2017-01-12 DIAGNOSIS — E11622 Type 2 diabetes mellitus with other skin ulcer: Secondary | ICD-10-CM | POA: Diagnosis not present

## 2017-01-14 DIAGNOSIS — Z8673 Personal history of transient ischemic attack (TIA), and cerebral infarction without residual deficits: Secondary | ICD-10-CM | POA: Diagnosis not present

## 2017-01-14 DIAGNOSIS — L97312 Non-pressure chronic ulcer of right ankle with fat layer exposed: Secondary | ICD-10-CM | POA: Diagnosis not present

## 2017-01-14 DIAGNOSIS — Z794 Long term (current) use of insulin: Secondary | ICD-10-CM | POA: Diagnosis not present

## 2017-01-14 DIAGNOSIS — I129 Hypertensive chronic kidney disease with stage 1 through stage 4 chronic kidney disease, or unspecified chronic kidney disease: Secondary | ICD-10-CM | POA: Diagnosis not present

## 2017-01-14 DIAGNOSIS — Z7902 Long term (current) use of antithrombotics/antiplatelets: Secondary | ICD-10-CM | POA: Diagnosis not present

## 2017-01-14 DIAGNOSIS — E1122 Type 2 diabetes mellitus with diabetic chronic kidney disease: Secondary | ICD-10-CM | POA: Diagnosis not present

## 2017-01-14 DIAGNOSIS — E11622 Type 2 diabetes mellitus with other skin ulcer: Secondary | ICD-10-CM | POA: Diagnosis not present

## 2017-01-14 DIAGNOSIS — D631 Anemia in chronic kidney disease: Secondary | ICD-10-CM | POA: Diagnosis not present

## 2017-01-14 DIAGNOSIS — N183 Chronic kidney disease, stage 3 (moderate): Secondary | ICD-10-CM | POA: Diagnosis not present

## 2017-01-16 DIAGNOSIS — Z8673 Personal history of transient ischemic attack (TIA), and cerebral infarction without residual deficits: Secondary | ICD-10-CM | POA: Diagnosis not present

## 2017-01-16 DIAGNOSIS — L97312 Non-pressure chronic ulcer of right ankle with fat layer exposed: Secondary | ICD-10-CM | POA: Diagnosis not present

## 2017-01-16 DIAGNOSIS — E1122 Type 2 diabetes mellitus with diabetic chronic kidney disease: Secondary | ICD-10-CM | POA: Diagnosis not present

## 2017-01-16 DIAGNOSIS — D631 Anemia in chronic kidney disease: Secondary | ICD-10-CM | POA: Diagnosis not present

## 2017-01-16 DIAGNOSIS — N183 Chronic kidney disease, stage 3 (moderate): Secondary | ICD-10-CM | POA: Diagnosis not present

## 2017-01-16 DIAGNOSIS — I129 Hypertensive chronic kidney disease with stage 1 through stage 4 chronic kidney disease, or unspecified chronic kidney disease: Secondary | ICD-10-CM | POA: Diagnosis not present

## 2017-01-16 DIAGNOSIS — E11622 Type 2 diabetes mellitus with other skin ulcer: Secondary | ICD-10-CM | POA: Diagnosis not present

## 2017-01-16 DIAGNOSIS — Z794 Long term (current) use of insulin: Secondary | ICD-10-CM | POA: Diagnosis not present

## 2017-01-16 DIAGNOSIS — Z7902 Long term (current) use of antithrombotics/antiplatelets: Secondary | ICD-10-CM | POA: Diagnosis not present

## 2017-01-18 DIAGNOSIS — D631 Anemia in chronic kidney disease: Secondary | ICD-10-CM | POA: Diagnosis not present

## 2017-01-18 DIAGNOSIS — L97312 Non-pressure chronic ulcer of right ankle with fat layer exposed: Secondary | ICD-10-CM | POA: Diagnosis not present

## 2017-01-18 DIAGNOSIS — E1122 Type 2 diabetes mellitus with diabetic chronic kidney disease: Secondary | ICD-10-CM | POA: Diagnosis not present

## 2017-01-18 DIAGNOSIS — I129 Hypertensive chronic kidney disease with stage 1 through stage 4 chronic kidney disease, or unspecified chronic kidney disease: Secondary | ICD-10-CM | POA: Diagnosis not present

## 2017-01-18 DIAGNOSIS — Z7902 Long term (current) use of antithrombotics/antiplatelets: Secondary | ICD-10-CM | POA: Diagnosis not present

## 2017-01-18 DIAGNOSIS — Z794 Long term (current) use of insulin: Secondary | ICD-10-CM | POA: Diagnosis not present

## 2017-01-18 DIAGNOSIS — N183 Chronic kidney disease, stage 3 (moderate): Secondary | ICD-10-CM | POA: Diagnosis not present

## 2017-01-18 DIAGNOSIS — Z8673 Personal history of transient ischemic attack (TIA), and cerebral infarction without residual deficits: Secondary | ICD-10-CM | POA: Diagnosis not present

## 2017-01-18 DIAGNOSIS — E11622 Type 2 diabetes mellitus with other skin ulcer: Secondary | ICD-10-CM | POA: Diagnosis not present

## 2017-01-23 DIAGNOSIS — E1122 Type 2 diabetes mellitus with diabetic chronic kidney disease: Secondary | ICD-10-CM | POA: Diagnosis not present

## 2017-01-23 DIAGNOSIS — Z794 Long term (current) use of insulin: Secondary | ICD-10-CM | POA: Diagnosis not present

## 2017-01-23 DIAGNOSIS — E11622 Type 2 diabetes mellitus with other skin ulcer: Secondary | ICD-10-CM | POA: Diagnosis not present

## 2017-01-23 DIAGNOSIS — D631 Anemia in chronic kidney disease: Secondary | ICD-10-CM | POA: Diagnosis not present

## 2017-01-23 DIAGNOSIS — L97312 Non-pressure chronic ulcer of right ankle with fat layer exposed: Secondary | ICD-10-CM | POA: Diagnosis not present

## 2017-01-23 DIAGNOSIS — Z7902 Long term (current) use of antithrombotics/antiplatelets: Secondary | ICD-10-CM | POA: Diagnosis not present

## 2017-01-23 DIAGNOSIS — I129 Hypertensive chronic kidney disease with stage 1 through stage 4 chronic kidney disease, or unspecified chronic kidney disease: Secondary | ICD-10-CM | POA: Diagnosis not present

## 2017-01-23 DIAGNOSIS — Z8673 Personal history of transient ischemic attack (TIA), and cerebral infarction without residual deficits: Secondary | ICD-10-CM | POA: Diagnosis not present

## 2017-01-23 DIAGNOSIS — N183 Chronic kidney disease, stage 3 (moderate): Secondary | ICD-10-CM | POA: Diagnosis not present

## 2017-02-10 DIAGNOSIS — E11622 Type 2 diabetes mellitus with other skin ulcer: Secondary | ICD-10-CM | POA: Diagnosis not present

## 2017-02-10 DIAGNOSIS — L97912 Non-pressure chronic ulcer of unspecified part of right lower leg with fat layer exposed: Secondary | ICD-10-CM | POA: Diagnosis not present

## 2017-02-10 DIAGNOSIS — I872 Venous insufficiency (chronic) (peripheral): Secondary | ICD-10-CM | POA: Diagnosis not present

## 2017-03-03 DIAGNOSIS — I872 Venous insufficiency (chronic) (peripheral): Secondary | ICD-10-CM | POA: Diagnosis not present

## 2017-03-09 DIAGNOSIS — I209 Angina pectoris, unspecified: Secondary | ICD-10-CM | POA: Diagnosis not present

## 2017-03-09 DIAGNOSIS — Z780 Asymptomatic menopausal state: Secondary | ICD-10-CM | POA: Diagnosis not present

## 2017-03-09 DIAGNOSIS — Z1211 Encounter for screening for malignant neoplasm of colon: Secondary | ICD-10-CM | POA: Diagnosis not present

## 2017-03-09 DIAGNOSIS — R6889 Other general symptoms and signs: Secondary | ICD-10-CM | POA: Diagnosis not present

## 2017-03-09 DIAGNOSIS — Z1159 Encounter for screening for other viral diseases: Secondary | ICD-10-CM | POA: Diagnosis not present

## 2017-03-09 DIAGNOSIS — Z794 Long term (current) use of insulin: Secondary | ICD-10-CM | POA: Diagnosis not present

## 2017-03-09 DIAGNOSIS — L97509 Non-pressure chronic ulcer of other part of unspecified foot with unspecified severity: Secondary | ICD-10-CM | POA: Diagnosis not present

## 2017-03-09 DIAGNOSIS — Z1231 Encounter for screening mammogram for malignant neoplasm of breast: Secondary | ICD-10-CM | POA: Diagnosis not present

## 2017-03-09 DIAGNOSIS — E11621 Type 2 diabetes mellitus with foot ulcer: Secondary | ICD-10-CM | POA: Diagnosis not present

## 2017-03-09 DIAGNOSIS — R5383 Other fatigue: Secondary | ICD-10-CM | POA: Diagnosis not present

## 2017-03-17 DIAGNOSIS — R110 Nausea: Secondary | ICD-10-CM | POA: Diagnosis not present

## 2017-03-17 DIAGNOSIS — R6889 Other general symptoms and signs: Secondary | ICD-10-CM | POA: Diagnosis not present

## 2017-03-17 DIAGNOSIS — Z8639 Personal history of other endocrine, nutritional and metabolic disease: Secondary | ICD-10-CM | POA: Diagnosis not present

## 2017-03-17 DIAGNOSIS — R350 Frequency of micturition: Secondary | ICD-10-CM | POA: Diagnosis not present

## 2017-03-18 ENCOUNTER — Encounter: Payer: Self-pay | Admitting: *Deleted

## 2017-03-18 ENCOUNTER — Emergency Department: Payer: Medicare HMO

## 2017-03-18 ENCOUNTER — Inpatient Hospital Stay
Admission: EM | Admit: 2017-03-18 | Discharge: 2017-03-30 | DRG: 871 | Disposition: A | Discharge status: Home Health | Payer: Medicare HMO | Attending: Internal Medicine | Admitting: Internal Medicine

## 2017-03-18 ENCOUNTER — Other Ambulatory Visit: Payer: Self-pay

## 2017-03-18 DIAGNOSIS — E1122 Type 2 diabetes mellitus with diabetic chronic kidney disease: Secondary | ICD-10-CM | POA: Diagnosis not present

## 2017-03-18 DIAGNOSIS — A419 Sepsis, unspecified organism: Principal | ICD-10-CM | POA: Diagnosis present

## 2017-03-18 DIAGNOSIS — E876 Hypokalemia: Secondary | ICD-10-CM | POA: Diagnosis present

## 2017-03-18 DIAGNOSIS — E785 Hyperlipidemia, unspecified: Secondary | ICD-10-CM | POA: Diagnosis present

## 2017-03-18 DIAGNOSIS — Z9841 Cataract extraction status, right eye: Secondary | ICD-10-CM | POA: Diagnosis not present

## 2017-03-18 DIAGNOSIS — E872 Acidosis: Secondary | ICD-10-CM | POA: Diagnosis not present

## 2017-03-18 DIAGNOSIS — Z90710 Acquired absence of both cervix and uterus: Secondary | ICD-10-CM

## 2017-03-18 DIAGNOSIS — Z79899 Other long term (current) drug therapy: Secondary | ICD-10-CM

## 2017-03-18 DIAGNOSIS — R509 Fever, unspecified: Secondary | ICD-10-CM | POA: Diagnosis not present

## 2017-03-18 DIAGNOSIS — R109 Unspecified abdominal pain: Secondary | ICD-10-CM | POA: Diagnosis not present

## 2017-03-18 DIAGNOSIS — Z7902 Long term (current) use of antithrombotics/antiplatelets: Secondary | ICD-10-CM

## 2017-03-18 DIAGNOSIS — Z833 Family history of diabetes mellitus: Secondary | ICD-10-CM

## 2017-03-18 DIAGNOSIS — Z8349 Family history of other endocrine, nutritional and metabolic diseases: Secondary | ICD-10-CM

## 2017-03-18 DIAGNOSIS — M170 Bilateral primary osteoarthritis of knee: Secondary | ICD-10-CM | POA: Diagnosis present

## 2017-03-18 DIAGNOSIS — B340 Adenovirus infection, unspecified: Secondary | ICD-10-CM | POA: Diagnosis not present

## 2017-03-18 DIAGNOSIS — Z972 Presence of dental prosthetic device (complete) (partial): Secondary | ICD-10-CM

## 2017-03-18 DIAGNOSIS — J9601 Acute respiratory failure with hypoxia: Secondary | ICD-10-CM | POA: Diagnosis not present

## 2017-03-18 DIAGNOSIS — Z8249 Family history of ischemic heart disease and other diseases of the circulatory system: Secondary | ICD-10-CM | POA: Diagnosis not present

## 2017-03-18 DIAGNOSIS — Z886 Allergy status to analgesic agent status: Secondary | ICD-10-CM | POA: Diagnosis not present

## 2017-03-18 DIAGNOSIS — Z6837 Body mass index (BMI) 37.0-37.9, adult: Secondary | ICD-10-CM

## 2017-03-18 DIAGNOSIS — Z8673 Personal history of transient ischemic attack (TIA), and cerebral infarction without residual deficits: Secondary | ICD-10-CM | POA: Diagnosis not present

## 2017-03-18 DIAGNOSIS — N183 Chronic kidney disease, stage 3 (moderate): Secondary | ICD-10-CM | POA: Diagnosis not present

## 2017-03-18 DIAGNOSIS — E860 Dehydration: Secondary | ICD-10-CM | POA: Diagnosis present

## 2017-03-18 DIAGNOSIS — R52 Pain, unspecified: Secondary | ICD-10-CM

## 2017-03-18 DIAGNOSIS — N179 Acute kidney failure, unspecified: Secondary | ICD-10-CM | POA: Diagnosis present

## 2017-03-18 DIAGNOSIS — E871 Hypo-osmolality and hyponatremia: Secondary | ICD-10-CM | POA: Diagnosis present

## 2017-03-18 DIAGNOSIS — R319 Hematuria, unspecified: Secondary | ICD-10-CM | POA: Diagnosis present

## 2017-03-18 DIAGNOSIS — A0472 Enterocolitis due to Clostridium difficile, not specified as recurrent: Secondary | ICD-10-CM | POA: Diagnosis not present

## 2017-03-18 DIAGNOSIS — E669 Obesity, unspecified: Secondary | ICD-10-CM | POA: Diagnosis present

## 2017-03-18 DIAGNOSIS — I10 Essential (primary) hypertension: Secondary | ICD-10-CM | POA: Diagnosis not present

## 2017-03-18 DIAGNOSIS — K219 Gastro-esophageal reflux disease without esophagitis: Secondary | ICD-10-CM | POA: Insufficient documentation

## 2017-03-18 DIAGNOSIS — E119 Type 2 diabetes mellitus without complications: Secondary | ICD-10-CM

## 2017-03-18 DIAGNOSIS — J189 Pneumonia, unspecified organism: Secondary | ICD-10-CM | POA: Diagnosis present

## 2017-03-18 DIAGNOSIS — M25571 Pain in right ankle and joints of right foot: Secondary | ICD-10-CM | POA: Diagnosis present

## 2017-03-18 DIAGNOSIS — Z961 Presence of intraocular lens: Secondary | ICD-10-CM | POA: Diagnosis present

## 2017-03-18 DIAGNOSIS — I129 Hypertensive chronic kidney disease with stage 1 through stage 4 chronic kidney disease, or unspecified chronic kidney disease: Secondary | ICD-10-CM | POA: Diagnosis present

## 2017-03-18 DIAGNOSIS — R809 Proteinuria, unspecified: Secondary | ICD-10-CM | POA: Diagnosis not present

## 2017-03-18 DIAGNOSIS — Z9842 Cataract extraction status, left eye: Secondary | ICD-10-CM

## 2017-03-18 DIAGNOSIS — Z880 Allergy status to penicillin: Secondary | ICD-10-CM

## 2017-03-18 DIAGNOSIS — Z794 Long term (current) use of insulin: Secondary | ICD-10-CM

## 2017-03-18 DIAGNOSIS — J181 Lobar pneumonia, unspecified organism: Secondary | ICD-10-CM | POA: Diagnosis not present

## 2017-03-18 LAB — COMPREHENSIVE METABOLIC PANEL
ALK PHOS: 65 U/L (ref 38–126)
ALT: 26 U/L (ref 14–54)
ANION GAP: 11 (ref 5–15)
AST: 54 U/L — ABNORMAL HIGH (ref 15–41)
Albumin: 3.7 g/dL (ref 3.5–5.0)
BUN: 21 mg/dL — ABNORMAL HIGH (ref 6–20)
CALCIUM: 8.3 mg/dL — AB (ref 8.9–10.3)
CO2: 20 mmol/L — AB (ref 22–32)
CREATININE: 1.76 mg/dL — AB (ref 0.44–1.00)
Chloride: 102 mmol/L (ref 101–111)
GFR, EST AFRICAN AMERICAN: 32 mL/min — AB (ref >60)
GFR, EST NON AFRICAN AMERICAN: 28 mL/min — AB (ref >60)
Glucose, Bld: 210 mg/dL — ABNORMAL HIGH (ref 65–99)
Potassium: 4 mmol/L (ref 3.5–5.1)
SODIUM: 133 mmol/L — AB (ref 135–145)
TOTAL PROTEIN: 8.1 g/dL (ref 6.5–8.1)
Total Bilirubin: 0.7 mg/dL (ref 0.3–1.2)

## 2017-03-18 LAB — CBC WITH DIFFERENTIAL/PLATELET
BASOS ABS: 0.1 10*3/uL (ref 0–0.1)
BASOS PCT: 1 %
EOS ABS: 0 10*3/uL (ref 0–0.7)
Eosinophils Relative: 0 %
HEMATOCRIT: 35.2 % (ref 35.0–47.0)
HEMOGLOBIN: 11.4 g/dL — AB (ref 12.0–16.0)
Lymphocytes Relative: 4 %
Lymphs Abs: 0.4 10*3/uL — ABNORMAL LOW (ref 1.0–3.6)
MCH: 21.6 pg — ABNORMAL LOW (ref 26.0–34.0)
MCHC: 32.3 g/dL (ref 32.0–36.0)
MCV: 66.9 fL — ABNORMAL LOW (ref 80.0–100.0)
Monocytes Absolute: 0.5 10*3/uL (ref 0.2–0.9)
Monocytes Relative: 6 %
NEUTROS ABS: 7.7 10*3/uL — AB (ref 1.4–6.5)
NEUTROS PCT: 89 %
Platelets: 187 10*3/uL (ref 150–440)
RBC: 5.26 MIL/uL — AB (ref 3.80–5.20)
RDW: 19 % — ABNORMAL HIGH (ref 11.5–14.5)
WBC: 8.7 10*3/uL (ref 3.6–11.0)

## 2017-03-18 LAB — INFLUENZA PANEL BY PCR (TYPE A & B)
INFLAPCR: NEGATIVE
INFLBPCR: NEGATIVE

## 2017-03-18 LAB — URINALYSIS, COMPLETE (UACMP) WITH MICROSCOPIC
BILIRUBIN URINE: NEGATIVE
GLUCOSE, UA: NEGATIVE mg/dL
Ketones, ur: 5 mg/dL — AB
LEUKOCYTES UA: NEGATIVE
NITRITE: NEGATIVE
PH: 5 (ref 5.0–8.0)
Protein, ur: 100 mg/dL — AB
Specific Gravity, Urine: 1.023 (ref 1.005–1.030)

## 2017-03-18 LAB — LACTIC ACID, PLASMA
LACTIC ACID, VENOUS: 1 mmol/L (ref 0.5–1.9)
Lactic Acid, Venous: 1.2 mmol/L (ref 0.5–1.9)

## 2017-03-18 LAB — GLUCOSE, CAPILLARY
GLUCOSE-CAPILLARY: 148 mg/dL — AB (ref 65–99)
Glucose-Capillary: 180 mg/dL — ABNORMAL HIGH (ref 65–99)

## 2017-03-18 LAB — C DIFFICILE QUICK SCREEN W PCR REFLEX
C DIFFICILE (CDIFF) TOXIN: NEGATIVE
C DIFFICLE (CDIFF) ANTIGEN: POSITIVE — AB

## 2017-03-18 MED ORDER — SODIUM CHLORIDE 0.9 % IV SOLN
INTRAVENOUS | Status: AC
  Administered 2017-03-18: 23:00:00 via INTRAVENOUS

## 2017-03-18 MED ORDER — ONDANSETRON HCL 4 MG PO TABS
4.0000 mg | ORAL_TABLET | Freq: Four times a day (QID) | ORAL | Status: DC | PRN
  Administered 2017-03-28 (×2): 4 mg via ORAL
  Filled 2017-03-18 (×2): qty 1

## 2017-03-18 MED ORDER — ACETAMINOPHEN 325 MG PO TABS
650.0000 mg | ORAL_TABLET | Freq: Four times a day (QID) | ORAL | Status: DC | PRN
  Administered 2017-03-18 – 2017-03-25 (×10): 650 mg via ORAL
  Filled 2017-03-18 (×11): qty 2

## 2017-03-18 MED ORDER — IPRATROPIUM-ALBUTEROL 0.5-2.5 (3) MG/3ML IN SOLN: 3.0000 mL | RESPIRATORY_TRACT | Status: DC | PRN

## 2017-03-18 MED ORDER — SODIUM CHLORIDE 0.9 % IV SOLN
1.0000 g | INTRAVENOUS | Status: DC
  Administered 2017-03-19: 1 g via INTRAVENOUS
  Filled 2017-03-18 (×2): qty 10

## 2017-03-18 MED ORDER — ACETAMINOPHEN 325 MG PO TABS
650.0000 mg | ORAL_TABLET | Freq: Once | ORAL | Status: AC | PRN
  Administered 2017-03-18: 650 mg via ORAL
  Filled 2017-03-18: qty 2

## 2017-03-18 MED ORDER — GUAIFENESIN-DM 100-10 MG/5ML PO SYRP
5.0000 mL | ORAL_SOLUTION | ORAL | Status: DC | PRN
  Administered 2017-03-18 – 2017-03-21 (×4): 5 mL via ORAL
  Filled 2017-03-18 (×5): qty 5

## 2017-03-18 MED ORDER — CLOPIDOGREL BISULFATE 75 MG PO TABS
75.0000 mg | ORAL_TABLET | Freq: Every day | ORAL | Status: DC
  Administered 2017-03-19 – 2017-03-30 (×12): 75 mg via ORAL
  Filled 2017-03-18 (×12): qty 1

## 2017-03-18 MED ORDER — ACETAMINOPHEN 650 MG RE SUPP
650.0000 mg | Freq: Four times a day (QID) | RECTAL | Status: DC | PRN
  Administered 2017-03-19: 650 mg via RECTAL
  Filled 2017-03-18: qty 1

## 2017-03-18 MED ORDER — HEPARIN SODIUM (PORCINE) 5000 UNIT/ML IJ SOLN
5000.0000 [IU] | Freq: Three times a day (TID) | INTRAMUSCULAR | Status: DC
  Administered 2017-03-19 – 2017-03-30 (×29): 5000 [IU] via SUBCUTANEOUS
  Filled 2017-03-18 (×29): qty 1

## 2017-03-18 MED ORDER — AZITHROMYCIN 500 MG IV SOLR
500.0000 mg | Freq: Once | INTRAVENOUS | Status: AC
  Administered 2017-03-18: 500 mg via INTRAVENOUS

## 2017-03-18 MED ORDER — SODIUM CHLORIDE 0.9 % IV SOLN
1.0000 g | Freq: Once | INTRAVENOUS | Status: AC
  Administered 2017-03-18: 1 g via INTRAVENOUS
  Filled 2017-03-18: qty 10

## 2017-03-18 MED ORDER — SODIUM CHLORIDE 0.9 % IV SOLN
500.0000 mg | INTRAVENOUS | Status: DC
  Filled 2017-03-18: qty 500

## 2017-03-18 MED ORDER — SODIUM CHLORIDE 0.9 % IV BOLUS (SEPSIS)
1000.0000 mL | Freq: Once | INTRAVENOUS | Status: AC
  Administered 2017-03-18: 1000 mL via INTRAVENOUS

## 2017-03-18 MED ORDER — INSULIN ASPART 100 UNIT/ML ~~LOC~~ SOLN
0.0000 [IU] | Freq: Three times a day (TID) | SUBCUTANEOUS | Status: DC
  Administered 2017-03-20: 1 [IU] via SUBCUTANEOUS
  Administered 2017-03-20: 2 [IU] via SUBCUTANEOUS
  Administered 2017-03-21 (×2): 1 [IU] via SUBCUTANEOUS
  Administered 2017-03-24: 2 [IU] via SUBCUTANEOUS
  Administered 2017-03-25: 3 [IU] via SUBCUTANEOUS
  Administered 2017-03-26: 2 [IU] via SUBCUTANEOUS
  Administered 2017-03-26 (×2): 1 [IU] via SUBCUTANEOUS
  Administered 2017-03-27: 2 [IU] via SUBCUTANEOUS
  Administered 2017-03-27 (×2): 1 [IU] via SUBCUTANEOUS
  Administered 2017-03-28 (×2): 2 [IU] via SUBCUTANEOUS
  Administered 2017-03-28: 1 [IU] via SUBCUTANEOUS
  Administered 2017-03-29 – 2017-03-30 (×3): 2 [IU] via SUBCUTANEOUS
  Administered 2017-03-30: 1 [IU] via SUBCUTANEOUS
  Filled 2017-03-18 (×17): qty 1

## 2017-03-18 MED ORDER — ONDANSETRON HCL 4 MG/2ML IJ SOLN
4.0000 mg | Freq: Once | INTRAMUSCULAR | Status: AC
  Administered 2017-03-18: 4 mg via INTRAVENOUS
  Filled 2017-03-18: qty 2

## 2017-03-18 MED ORDER — ONDANSETRON HCL 4 MG/2ML IJ SOLN
4.0000 mg | Freq: Four times a day (QID) | INTRAMUSCULAR | Status: DC | PRN
  Administered 2017-03-19 – 2017-03-27 (×3): 4 mg via INTRAVENOUS
  Filled 2017-03-18 (×3): qty 2

## 2017-03-18 MED ORDER — INSULIN ASPART 100 UNIT/ML ~~LOC~~ SOLN: 0.0000 [IU] | Freq: Every day | SUBCUTANEOUS | Status: DC

## 2017-03-18 MED ORDER — ATORVASTATIN CALCIUM 20 MG PO TABS
40.0000 mg | ORAL_TABLET | Freq: Every day | ORAL | Status: DC
  Administered 2017-03-19 – 2017-03-29 (×11): 40 mg via ORAL
  Filled 2017-03-18 (×10): qty 2

## 2017-03-18 MED ORDER — SODIUM CHLORIDE 0.9 % IV SOLN
INTRAVENOUS | Status: AC
  Filled 2017-03-18: qty 500

## 2017-03-18 NOTE — H&P (Signed)
Ann Price at [place] NAME: Ann Price    MR#:  [id number]  DATE OF BIRTH:  [birth year]  DATE OF ADMISSION:  03/18/2017  PRIMARY CARE PHYSICIAN: Ann Axe, MD   REQUESTING/REFERRING PHYSICIAN: Burlene Arnt, MD  CHIEF COMPLAINT:   Chief Complaint  Patient presents with  . Fever  . Generalized Body Aches    HISTORY OF PRESENT ILLNESS:  Ann Price  is a 72 y.o. female who presents with 2-3 days of progressive cough, malaise, fever.  She presented to the ED with fever today.  She is found on imaging to have pneumonia.  Her cough has been mildly productive of some discolored sputum.  She met sepsis criteria and hospitalist were called for admission.  PAST MEDICAL HISTORY:   Past Medical History:  Diagnosis Date  . Arthritis    knees  . Diabetes mellitus without complication ([place])   . GERD (gastroesophageal reflux disease)   . Hyperlipidemia   . Hypertension   . Stroke ([place]) 2016   (TIA) no deficits  . Wears dentures    full upper, partial lower    PAST SURGICAL HISTORY:   Past Surgical History:  Procedure Laterality Date  . CATARACT EXTRACTION W/PHACO Right 03/25/2016   Procedure: CATARACT EXTRACTION PHACO AND INTRAOCULAR LENS PLACEMENT ([place])  right diabetic;  Surgeon: Ann Bear, MD;  Location: [place];  Service: Ophthalmology;  Laterality: Right;  diabetic - oral meds  . CATARACT EXTRACTION W/PHACO Left 06/03/2016   Procedure: CATARACT EXTRACTION PHACO AND INTRAOCULAR LENS PLACEMENT ([facility]);  Surgeon: Ann Bear, MD;  Location: [place];  Service: Ophthalmology;  Laterality: Left;  LEFT DIABETES - oral meds  . COLONOSCOPY    . KNEE ARTHROSCOPY Right 03/22/2015   Procedure: ARTHROSCOPY KNEE, PARTIAL MEDIAL MENISECTOMY;  Surgeon: Ann Knows, MD;  Location: [facility];  Service: Orthopedics;  Laterality: Right;  . TUBAL LIGATION    . UPPER GI ENDOSCOPY    . VAGINAL HYSTERECTOMY       SOCIAL HISTORY:   Social History   Tobacco Use  . Smoking status: Never Smoker  . Smokeless tobacco: Never Used  Substance Use Topics  . Alcohol use: No    FAMILY HISTORY:   Family History  Problem Relation Age of Onset  . Hypertension Mother   . Hyperlipidemia Mother   . Hypertension Father   . Hyperlipidemia Father   . Diabetes Father   . Diabetes Maternal Grandmother     DRUG ALLERGIES:   Allergies  Allergen Reactions  . Aspirin Itching  . Penicillins Other (See Comments)    Stomach burns Has patient had a PCN reaction causing immediate rash, facial/tongue/throat swelling, SOB or lightheadedness with hypotension: No Has patient had a PCN reaction causing severe rash involving mucus membranes or skin necrosis: No Has patient had a PCN reaction that required hospitalization: No Has patient had a PCN reaction occurring within the last 10 years: No If all of the above answers are "NO", then may proceed with Cephalosporin use.     MEDICATIONS AT HOME:   Prior to Admission medications   Medication Sig Start Date End Date Taking? Authorizing Provider  amLODipine (NORVASC) 10 MG tablet Take 10 mg by mouth daily.   Yes [provider]  atenolol (TENORMIN) 50 MG tablet Take 50 mg by mouth daily.   Yes [provider]  atorvastatin (LIPITOR) 40 MG tablet Take 1 tablet (40 mg total) by mouth daily at 6  PM. 06/22/16  Yes Ann Loll, MD  clopidogrel (PLAVIX) 75 MG tablet Take 1 tablet (75 mg total) by mouth daily. 06/22/16  Yes Ann Loll, MD  hydrALAZINE (APRESOLINE) 25 MG tablet Take 25 mg by mouth 3 (three) times daily.    Yes [provider]  hydrochlorothiazide (HYDRODIURIL) 25 MG tablet Take 25 mg by mouth daily.   Yes [provider]  Insulin Glargine (LANTUS SOLOSTAR) 100 UNIT/ML Solostar Pen Inject 36 Units into the skin at bedtime. 12/09/16  Yes [provider]  losartan (COZAAR) 100 MG tablet Take 100 mg by mouth daily.    Yes [provider]  metFORMIN (GLUCOPHAGE) 1000 MG tablet Take 1 tablet (1,000 mg total) by mouth 2 (two) times daily with a meal. 06/22/16  Yes Ann Loll, MD    REVIEW OF SYSTEMS:  Review of Systems  Constitutional: Positive for fever and malaise/fatigue. Negative for chills and weight loss.  HENT: Negative for ear pain, hearing loss and tinnitus.   Eyes: Negative for blurred vision, double vision, pain and redness.  Respiratory: Positive for cough, sputum production and shortness of breath. Negative for hemoptysis.   Cardiovascular: Negative for chest pain, palpitations, orthopnea and leg swelling.  Gastrointestinal: Negative for abdominal pain, constipation, diarrhea, nausea and vomiting.  Genitourinary: Negative for dysuria, frequency and hematuria.  Musculoskeletal: Negative for back pain, joint pain and neck pain.  Skin:       No acne, rash, or lesions  Neurological: Negative for dizziness, tremors, focal weakness and weakness.  Endo/Heme/Allergies: Negative for polydipsia. Does not bruise/bleed easily.  Psychiatric/Behavioral: Negative for depression. The patient is not nervous/anxious and does not have insomnia.      VITAL SIGNS:   Vitals:   03/18/17 1930 03/18/17 2000 03/18/17 2030 03/18/17 2100  BP: 134/65 (!) 129/111 (!) 154/79 99/79  Pulse:  88 95 100  Resp:  18 18 (!) 25  Temp:      TempSrc:      SpO2:  97% 93% 93%  Weight:      Height:       Wt Readings from Last 3 Encounters:  03/18/17 96.2 kg (212 lb)  06/21/16 90.3 kg (199 lb)  06/03/16 91.2 kg (201 lb)    PHYSICAL EXAMINATION:  Physical Exam  Vitals reviewed. Constitutional: She is oriented to person, place, and time. She appears well-developed and well-nourished. No distress.  HENT:  Head: Normocephalic and atraumatic.  Mouth/Throat: Oropharynx is clear and moist.  Eyes: Conjunctivae and EOM are normal. Pupils are equal, round, and reactive to light. No scleral icterus.  Neck: Normal  range of motion. Neck supple. No JVD present. No thyromegaly present.  Cardiovascular: Normal rate, regular rhythm and intact distal pulses. Exam reveals no gallop and no friction rub.  No murmur heard. Respiratory: Effort normal. No respiratory distress. She has no wheezes. She has no rales.  Basilar rhonchi  GI: Soft. Bowel sounds are normal. She exhibits no distension. There is no tenderness.  Musculoskeletal: Normal range of motion. She exhibits no edema.  No arthritis, no gout  Lymphadenopathy:    She has no cervical adenopathy.  Neurological: She is alert and oriented to person, place, and time. No cranial nerve deficit.  No dysarthria, no aphasia  Skin: Skin is warm and dry. No rash noted. No erythema.  Psychiatric: She has a normal mood and affect. Her behavior is normal. Judgment and thought content normal.    LABORATORY PANEL:   CBC Recent Labs  Lab 03/18/17 1915  WBC 8.7  HGB 11.4*  HCT 35.2  PLT 187   ------------------------------------------------------------------------------------------------------------------  Chemistries  Recent Labs  Lab 03/18/17 1915  NA 133*  K 4.0  CL 102  CO2 20*  GLUCOSE 210*  BUN 21*  CREATININE 1.76*  CALCIUM 8.3*  AST 54*  ALT 26  ALKPHOS 65  BILITOT 0.7   ------------------------------------------------------------------------------------------------------------------  Cardiac Enzymes No results for input(s): TROPONINI in the last 168 hours. ------------------------------------------------------------------------------------------------------------------  RADIOLOGY:  Dg Chest 2 View  Result Date: 03/18/2017 CLINICAL DATA:  72 year old female with multiple days of generalized body aches, nausea, diarrhea and fever. EXAM: CHEST  2 VIEW COMPARISON:  Chest x-ray 04/18/2014. FINDINGS: Airspace consolidation in the left lower lobe, concerning for pneumonia. Right lung is clear. No pleural effusions. Vascular crowding,  without frank pulmonary edema. No pneumothorax. Heart size is borderline enlarged. Upper mediastinal contours are within normal limits. IMPRESSION: 1. Left lower lobe airspace consolidation concerning for pneumonia. Electronically Signed   By: Vinnie Langton M.D.   On: 03/18/2017 19:03    EKG:   Orders placed or performed during the hospital encounter of 06/21/16  . EKG 12-Lead  . EKG 12-Lead  . EKG 12-Lead  . EKG 12-Lead  . ED EKG  . ED EKG    IMPRESSION AND PLAN:  Principal Problem:   Sepsis ([place]) -IV antibiotics given, lactic acid within normal limits, blood pressure on the low end of normal but stable, will support with IV fluids, cultures sent Active Problems:   CAP (community acquired pneumonia) -IV antibiotics as above, cultures above, supportive therapies PRN   AKI (acute kidney injury) ([place]) -IV fluids as above, avoid nephrotoxins and monitor for expected improvement   Diabetes (HCC) -sliding scale insulin with corresponding glucose checks   HTN (hypertension) -hold antihypertensives for now as her blood pressure is low and normal, these can be restarted once her blood pressure rises   HLD (hyperlipidemia) -home dose statin  All the records are reviewed and case discussed with ED provider. Management plans discussed with the patient and/or family.  DVT PROPHYLAXIS: SubQ heparin  GI PROPHYLAXIS: None  ADMISSION STATUS: Inpatient  CODE STATUS: Full Code Status History    Date Active Date Inactive Code Status Order ID Comments User Context   06/21/2016 15:52 06/22/2016 17:05 Full Code [id number]  Henreitta Leber, MD ED      TOTAL TIME TAKING CARE OF THIS PATIENT: 45 minutes.   Thresa Dozier [place] 03/18/2017, 9:30 PM  [facility]  Office  [phone]  CC: Primary care physician; Ann Axe, MD  Note:  This document was prepared using Dragon voice recognition software and may include unintentional dictation errors.

## 2017-03-18 NOTE — Progress Notes (Addendum)
Pharmacy Antibiotic Note  Ann OddiBarbara L Borrero is a 72 y.o. female admitted on 03/18/2017 with CAP.  Pharmacy has been consulted for ceftriaxone dosing.  Plan: Ceftriaxone 1 gram q 24 hours ordered  Height: 5\' 3"  (160 cm) Weight: 212 lb (96.2 kg) IBW/kg (Calculated) : 52.4  Temp (24hrs), Avg:102.9 F (39.4 C), Min:102.9 F (39.4 C), Max:102.9 F (39.4 C)  Recent Labs  Lab 03/18/17 1915 03/18/17 1955  WBC 8.7  --   CREATININE 1.76*  --   LATICACIDVEN  --  1.2    Estimated Creatinine Clearance: 31.4 mL/min (A) (by C-G formula based on SCr of 1.76 mg/dL (H)).    Allergies  Allergen Reactions  . Aspirin Itching  . Penicillins Other (See Comments)    Stomach burns Has patient had a PCN reaction causing immediate rash, facial/tongue/throat swelling, SOB or lightheadedness with hypotension: No Has patient had a PCN reaction causing severe rash involving mucus membranes or skin necrosis: No Has patient had a PCN reaction that required hospitalization: No Has patient had a PCN reaction occurring within the last 10 years: No If all of the above answers are "NO", then may proceed with Cephalosporin use.     Antimicrobials this admission: Azithromycin, ceftriaxone 2/20  >>    >>   Dose adjustments this admission:   Microbiology results: 2/20 BCx: pending 2/20 UCx: pending       2/20 CXR: LLL consolidation 2/20 UA: pending  Thank you for allowing pharmacy to be a part of this patient's care.  Shonteria Abeln S 03/18/2017 9:38 PM

## 2017-03-18 NOTE — ED Provider Notes (Signed)
[facility] Emergency Department Provider Note  ____________________________________________   I have reviewed the triage vital signs and the nursing notes. Where available I have reviewed prior notes and, if possible and indicated, outside hospital notes.    HISTORY  Chief Complaint Fever and Generalized Body Aches    HPI Ann OddiBarbara L Weese is a 72 y.o. female who presents today complaining of cough, nausea, vomiting, diarrhea, fever.  Patient has multiple family members with similar.  She states that she feels weak and unwell.  She states she is not have any emesis that is bloody or melena or bright red blood per rectum.  But she is having watery diarrhea. She denies any chest pain, she has had symptoms since Sunday, today is Thursday.  Getting worse and not better.  Past Medical History:  Diagnosis Date  . Arthritis    knees  . Diabetes mellitus without complication (HCC)   . GERD (gastroesophageal reflux disease)   . Hyperlipidemia   . Hypertension   . Stroke (HCC) 2016   (TIA) no deficits  . Wears dentures    full upper, partial lower    Patient Active Problem List   Diagnosis Date Noted  . CVA (cerebral vascular accident) (HCC) 06/21/2016    Past Surgical History:  Procedure Laterality Date  . CATARACT EXTRACTION W/PHACO Right 03/25/2016   Procedure: CATARACT EXTRACTION PHACO AND INTRAOCULAR LENS PLACEMENT (IOC)  right diabetic;  Surgeon: Nevada CraneBradley Mark King, MD;  [facility];  Service: Ophthalmology;  Laterality: Right;  diabetic - oral meds  . CATARACT EXTRACTION W/PHACO Left 06/03/2016   Procedure: CATARACT EXTRACTION PHACO AND INTRAOCULAR LENS PLACEMENT (IOC);  Surgeon: Nevada CraneKing, Bradley Mark, MD;  [facility];  Service: Ophthalmology;  Laterality: Left;  LEFT DIABETES - oral meds  . COLONOSCOPY    . KNEE ARTHROSCOPY Right 03/22/2015   Procedure: ARTHROSCOPY KNEE, PARTIAL MEDIAL MENISECTOMY;  Surgeon: Kennedy BuckerMichael  Menz, MD;  Location: [facility] ORS;  Service: Orthopedics;  Laterality: Right;  . TUBAL LIGATION    . UPPER GI ENDOSCOPY    . VAGINAL HYSTERECTOMY      Prior to Admission medications   Medication Sig Start Date End Date Taking? Authorizing Provider  amLODipine (NORVASC) 10 MG tablet Take 10 mg by mouth daily.    [provider]  atenolol (TENORMIN) 50 MG tablet Take 50 mg by mouth daily.    [provider]  atorvastatin (LIPITOR) 40 MG tablet Take 1 tablet (40 mg total) by mouth daily at 6 PM. 06/22/16   Shaune Pollackhen, Qing, MD  clopidogrel (PLAVIX) 75 MG tablet Take 1 tablet (75 mg total) by mouth daily. 06/22/16   Shaune Pollackhen, Qing, MD  glipiZIDE (GLUCOTROL) 10 MG tablet Take 20 mg by mouth daily before breakfast.    [provider]  hydrALAZINE (APRESOLINE) 25 MG tablet Take 25 mg by mouth daily.     [provider]  hydrochlorothiazide (HYDRODIURIL) 25 MG tablet Take 25 mg by mouth daily.    [provider]  losartan (COZAAR) 100 MG tablet Take 100 mg by mouth daily.    [provider]  metFORMIN (GLUCOPHAGE) 1000 MG tablet Take 1 tablet (1,000 mg total) by mouth 2 (two) times daily with a meal. 06/22/16   Shaune Pollackhen, Qing, MD  pioglitazone (ACTOS) 45 MG tablet Take 45 mg by mouth daily.    [provider]    Allergies Aspirin and Penicillins  History reviewed. No pertinent family history.  Social History Social History  Tobacco Use  . Smoking status: Never Smoker  . Smokeless tobacco: Never Used  Substance Use Topics  . Alcohol use: No  . Drug use: No    Review of Systems Constitutional: No fever/chills Eyes: No visual changes. ENT: No sore throat. No stiff neck no neck pain Cardiovascular: Denies chest pain. Respiratory: Denies shortness of breath. Gastrointestinal:   + vomiting.  + diarrhea.  No constipation. Genitourinary: Negative for dysuria. Musculoskeletal: Negative lower extremity swelling Skin: Negative for  rash. Neurological: Negative for severe headaches, focal weakness or numbness.   ____________________________________________   PHYSICAL EXAM:  VITAL SIGNS: ED Triage Vitals [03/18/17 1834]  Enc Vitals Group     BP 110/82     Pulse Rate (!) 114     Resp 18     Temp (!) 102.9 F (39.4 C)     Temp Source Oral     SpO2 100 %     Weight 212 lb (96.2 kg)     Height 5\' 3"  (1.6 m)     Head Circumference      Peak Flow      Pain Score 10     Pain Loc      Pain Edu?      Excl. in GC?     Constitutional: Alert and oriented.  Patient appears as if she does not feel well but she is nontoxic Eyes: Conjunctivae are normal Head: Atraumatic HEENT: No congestion/rhinnorhea. Mucous membranes are dry.  Oropharynx non-erythematous Neck:   Nontender with no meningismus, no masses, no stridor Cardiovascular: Tachycardia egular rhythm. Grossly normal heart sounds.  Good peripheral circulation. Respiratory: Normal respiratory effort.  No retractions. Lungs show rhonchi in the upper left. Abdominal: Soft and nontender. No distention. No guarding no rebound Back:  There is no focal tenderness or step off.  there is no midline tenderness there are no lesions noted. there is no CVA tenderness Musculoskeletal: No lower extremity tenderness, no upper extremity tenderness. No joint effusions, no DVT signs strong distal pulses no edema Neurologic:  Normal speech and language. No gross focal neurologic deficits are appreciated.  Skin:  Skin is warm, dry and intact. No rash noted. Psychiatric: Mood and affect are normal. Speech and behavior are normal.  ____________________________________________   LABS (all labs ordered are listed, but only abnormal results are displayed)  Labs Reviewed  GLUCOSE, CAPILLARY - Abnormal; Notable for the following components:      Result Value   Glucose-Capillary 180 (*)    All other components within normal limits  COMPREHENSIVE METABOLIC PANEL  CBC WITH  DIFFERENTIAL/PLATELET  URINALYSIS, COMPLETE (UACMP) WITH MICROSCOPIC  INFLUENZA PANEL BY PCR (TYPE A & B)    Pertinent labs  results that were available during my care of the patient were reviewed by me and considered in my medical decision making (see chart for details). ____________________________________________  EKG  I personally interpreted any EKGs ordered by me or triage  ____________________________________________  RADIOLOGY  Pertinent labs & imaging results that were available during my care of the patient were reviewed by me and considered in my medical decision making (see chart for details). If possible, patient and/or family made aware of any abnormal findings.  Dg Chest 2 View  Result Date: 03/18/2017 CLINICAL DATA:  71 year old female with multiple days of generalized body aches, nausea, diarrhea and fever. EXAM: CHEST  2 VIEW COMPARISON:  Chest x-ray 04/18/2014. FINDINGS: Airspace consolidation in the left lower lobe, concerning for pneumonia. Right lung is clear. No pleural  effusions. Vascular crowding, without frank pulmonary edema. No pneumothorax. Heart size is borderline enlarged. Upper mediastinal contours are within normal limits. IMPRESSION: 1. Left lower lobe airspace consolidation concerning for pneumonia. Electronically Signed   By: Trudie Reed M.D.   On: 03/18/2017 19:03   ____________________________________________    PROCEDURES  Procedure(s) performed: None  Procedures  Critical Care performed: None  ____________________________________________   INITIAL IMPRESSION / ASSESSMENT AND PLAN / ED COURSE  Pertinent labs & imaging results that were available during my care of the patient were reviewed by me and considered in my medical decision making (see chart for details).  Patient here with multiple different complaints fever, cough, diarrhea, vomiting.  Very consistent with influenza but she does have what appears to be a a pneumonia on  chest x-ray which could certainly be a bacterial superinfection.  With some reluctance, given her diarrhea, I think she does need antibiotics and likely admission given her age and pneumonia and symptoms and tachycardia to    ____________________________________________   FINAL CLINICAL IMPRESSION(S) / ED DIAGNOSES  Final diagnoses:  None      This chart was dictated using voice recognition software.  Despite best efforts to proofread,  errors can occur which can change meaning.      Jeanmarie Plant, MD 03/18/17 [phone]

## 2017-03-18 NOTE — ED Notes (Signed)
Vanessa RN, aware of bed assigned  

## 2017-03-18 NOTE — ED Triage Notes (Signed)
PT to ED reporting multiple days of generalized body aches, nausea, diarrhea, fever. OPt has a nonproductive cough in triage. Multiple other family members with similar symptoms. Family reports they tested negative for influenza but that they were given tamiflu anyway.

## 2017-03-19 LAB — CBC
HCT: 36.3 % (ref 35.0–47.0)
Hemoglobin: 11.5 g/dL — ABNORMAL LOW (ref 12.0–16.0)
MCH: 21.7 pg — ABNORMAL LOW (ref 26.0–34.0)
MCHC: 31.7 g/dL — AB (ref 32.0–36.0)
MCV: 68.6 fL — ABNORMAL LOW (ref 80.0–100.0)
PLATELETS: 187 10*3/uL (ref 150–440)
RBC: 5.29 MIL/uL — ABNORMAL HIGH (ref 3.80–5.20)
RDW: 19.6 % — AB (ref 11.5–14.5)
WBC: 8 10*3/uL (ref 3.6–11.0)

## 2017-03-19 LAB — GASTROINTESTINAL PANEL BY PCR, STOOL (REPLACES STOOL CULTURE)
ASTROVIRUS: NOT DETECTED
Adenovirus F40/41: NOT DETECTED
CYCLOSPORA CAYETANENSIS: NOT DETECTED
Campylobacter species: NOT DETECTED
Cryptosporidium: NOT DETECTED
ENTAMOEBA HISTOLYTICA: NOT DETECTED
ENTEROAGGREGATIVE E COLI (EAEC): NOT DETECTED
ENTEROPATHOGENIC E COLI (EPEC): NOT DETECTED
ENTEROTOXIGENIC E COLI (ETEC): NOT DETECTED
Giardia lamblia: NOT DETECTED
NOROVIRUS GI/GII: NOT DETECTED
Plesimonas shigelloides: NOT DETECTED
Rotavirus A: NOT DETECTED
SAPOVIRUS (I, II, IV, AND V): NOT DETECTED
SHIGA LIKE TOXIN PRODUCING E COLI (STEC): NOT DETECTED
Salmonella species: NOT DETECTED
Shigella/Enteroinvasive E coli (EIEC): NOT DETECTED
VIBRIO CHOLERAE: NOT DETECTED
VIBRIO SPECIES: NOT DETECTED
Yersinia enterocolitica: NOT DETECTED

## 2017-03-19 LAB — PROCALCITONIN: Procalcitonin: 2.15 ng/mL

## 2017-03-19 LAB — BASIC METABOLIC PANEL
Anion gap: 10 (ref 5–15)
BUN: 20 mg/dL (ref 6–20)
CALCIUM: 8.2 mg/dL — AB (ref 8.9–10.3)
CO2: 22 mmol/L (ref 22–32)
CREATININE: 1.52 mg/dL — AB (ref 0.44–1.00)
Chloride: 105 mmol/L (ref 101–111)
GFR calc Af Amer: 38 mL/min — ABNORMAL LOW (ref >60)
GFR, EST NON AFRICAN AMERICAN: 33 mL/min — AB (ref >60)
GLUCOSE: 149 mg/dL — AB (ref 65–99)
Potassium: 3.8 mmol/L (ref 3.5–5.1)
Sodium: 137 mmol/L (ref 135–145)

## 2017-03-19 LAB — GLUCOSE, CAPILLARY
GLUCOSE-CAPILLARY: 90 mg/dL (ref 65–99)
Glucose-Capillary: 104 mg/dL — ABNORMAL HIGH (ref 65–99)
Glucose-Capillary: 104 mg/dL — ABNORMAL HIGH (ref 65–99)
Glucose-Capillary: 124 mg/dL — ABNORMAL HIGH (ref 65–99)

## 2017-03-19 LAB — CLOSTRIDIUM DIFFICILE BY PCR, REFLEXED: CDIFFPCR: POSITIVE — AB

## 2017-03-19 MED ORDER — SODIUM CHLORIDE 0.9% FLUSH
3.0000 mL | Freq: Two times a day (BID) | INTRAVENOUS | Status: DC
  Administered 2017-03-19 – 2017-03-30 (×23): 3 mL via INTRAVENOUS

## 2017-03-19 MED ORDER — AMLODIPINE BESYLATE 10 MG PO TABS
10.0000 mg | ORAL_TABLET | Freq: Every day | ORAL | Status: DC
  Administered 2017-03-19 – 2017-03-21 (×3): 10 mg via ORAL
  Filled 2017-03-19 (×3): qty 1

## 2017-03-19 MED ORDER — VANCOMYCIN 50 MG/ML ORAL SOLUTION
125.0000 mg | Freq: Four times a day (QID) | ORAL | Status: DC
  Administered 2017-03-19 – 2017-03-24 (×22): 125 mg via ORAL
  Filled 2017-03-19 (×25): qty 2.5

## 2017-03-19 MED ORDER — SODIUM CHLORIDE 0.9 % IV SOLN
500.0000 mg | INTRAVENOUS | Status: DC
  Administered 2017-03-19 – 2017-03-20 (×2): 500 mg via INTRAVENOUS
  Filled 2017-03-19 (×2): qty 500

## 2017-03-19 MED ORDER — ACETAMINOPHEN 500 MG PO TABS
1000.0000 mg | ORAL_TABLET | ORAL | Status: AC
  Administered 2017-03-19: 1000 mg via ORAL
  Filled 2017-03-19: qty 2

## 2017-03-19 MED ORDER — HYDRALAZINE HCL 25 MG PO TABS
25.0000 mg | ORAL_TABLET | Freq: Three times a day (TID) | ORAL | Status: DC
  Administered 2017-03-19 – 2017-03-21 (×6): 25 mg via ORAL
  Filled 2017-03-19 (×6): qty 1

## 2017-03-19 MED ORDER — TEMAZEPAM 7.5 MG PO CAPS
7.5000 mg | ORAL_CAPSULE | Freq: Every evening | ORAL | Status: DC | PRN
  Administered 2017-03-20 – 2017-03-30 (×8): 7.5 mg via ORAL
  Filled 2017-03-19 (×8): qty 1

## 2017-03-19 MED ORDER — ATENOLOL 50 MG PO TABS
50.0000 mg | ORAL_TABLET | Freq: Every day | ORAL | Status: DC
  Administered 2017-03-19 – 2017-03-30 (×12): 50 mg via ORAL
  Filled 2017-03-19 (×12): qty 1

## 2017-03-19 MED ORDER — IBUPROFEN 400 MG PO TABS
400.0000 mg | ORAL_TABLET | ORAL | Status: AC
  Administered 2017-03-19: 400 mg via ORAL
  Filled 2017-03-19: qty 1

## 2017-03-19 MED ORDER — SODIUM CHLORIDE 0.9 % IV SOLN
INTRAVENOUS | Status: AC
  Administered 2017-03-19: 12:00:00 via INTRAVENOUS

## 2017-03-19 NOTE — Progress Notes (Signed)
[employer] [facility] [place] [facility]   PATIENT NAME: Ann Price    MR#:  [id number]  DATE OF BIRTH:  11/06/1945  SUBJECTIVE:  CHIEF COMPLAINT:   Chief Complaint  Patient presents with  . Fever  . Generalized Body Aches   Fever 102.9 this am, cough with sputum, generalized weakness and watery diarrhea multiple times. REVIEW OF SYSTEMS:  Review of Systems  Constitutional: Positive for fever and malaise/fatigue. Negative for chills.  HENT: Negative for sore throat.   Eyes: Negative for blurred vision and double vision.  Respiratory: Positive for cough, sputum production and shortness of breath. Negative for hemoptysis, wheezing and stridor.   Cardiovascular: Negative for chest pain, palpitations, orthopnea and leg swelling.  Gastrointestinal: Positive for diarrhea. Negative for abdominal pain, blood in stool, melena, nausea and vomiting.  Genitourinary: Negative for dysuria, flank pain and hematuria.  Musculoskeletal: Negative for back pain and joint pain.  Skin: Negative for rash.  Neurological: Positive for weakness. Negative for dizziness, sensory change, focal weakness, seizures, loss of consciousness and headaches.  Endo/Heme/Allergies: Negative for polydipsia.  Psychiatric/Behavioral: Negative for depression. The patient is not nervous/anxious.     DRUG ALLERGIES:   Allergies  Allergen Reactions  . Aspirin Itching  . Penicillins Other (See Comments)    Stomach burns Has patient had a PCN reaction causing immediate rash, facial/tongue/throat swelling, SOB or lightheadedness with hypotension: No Has patient had a PCN reaction causing severe rash involving mucus membranes or skin necrosis: No Has patient had a PCN reaction that required hospitalization: No Has patient had a PCN reaction occurring within the last 10 years: No If all of the above answers are "NO", then may proceed with Cephalosporin use.    VITALS:  Blood pressure (!) 161/66, pulse  97, temperature 100.2 F (37.9 C), resp. rate 20, height 5\' 3"  (1.6 m), weight 204 lb 4.8 oz (92.7 kg), SpO2 92 %. PHYSICAL EXAMINATION:  Physical Exam  Constitutional: She is oriented to person, place, and time and well-developed, well-nourished, and in no distress.  Obesity.  HENT:  Head: Normocephalic.  Mouth/Throat: Oropharynx is clear and moist.  Eyes: Conjunctivae and EOM are normal. Pupils are equal, round, and reactive to light. No scleral icterus.  Neck: Normal range of motion. Neck supple. No JVD present. No tracheal deviation present.  Cardiovascular: Normal rate, regular rhythm and normal heart sounds. Exam reveals no gallop.  No murmur heard. Pulmonary/Chest: Effort normal and breath sounds normal. No respiratory distress. She has no wheezes. She has no rales.  Abdominal: Soft. Bowel sounds are normal. She exhibits no distension. There is tenderness. There is no rebound.  Musculoskeletal: Normal range of motion. She exhibits no edema or tenderness.  Neurological: She is alert and oriented to person, place, and time. No cranial nerve deficit.  Skin: No rash noted. No erythema.  Psychiatric: Affect normal.   LABORATORY PANEL:  Female CBC Recent Labs  Lab 03/19/17 0322  WBC 8.0  HGB 11.5*  HCT 36.3  PLT 187   ------------------------------------------------------------------------------------------------------------------ Chemistries  Recent Labs  Lab 03/18/17 1915 03/19/17 0322  NA 133* 137  K 4.0 3.8  CL 102 105  CO2 20* 22  GLUCOSE 210* 149*  BUN 21* 20  CREATININE 1.76* 1.52*  CALCIUM 8.3* 8.2*  AST 54*  --   ALT 26  --   ALKPHOS 65  --   BILITOT 0.7  --    RADIOLOGY:  Dg Chest 2 View  Result Date: 03/18/2017 CLINICAL DATA:  72 year old female with multiple days of generalized body aches, nausea, diarrhea and fever. EXAM: CHEST  2 VIEW COMPARISON:  Chest x-ray 04/18/2014. FINDINGS: Airspace consolidation in the left lower lobe, concerning for  pneumonia. Right lung is clear. No pleural effusions. Vascular crowding, without frank pulmonary edema. No pneumothorax. Heart size is borderline enlarged. Upper mediastinal contours are within normal limits. IMPRESSION: 1. Left lower lobe airspace consolidation concerning for pneumonia. Electronically Signed   By: Trudie Reed M.D.   On: 03/18/2017 19:03   ASSESSMENT AND PLAN:   Sepsis due to CAP Continue Zithromax and Rocephin, follow-up cultures.    AKI (acute kidney injury) Improving with IV fluids, avoid nephrotoxins.    Diabetes (HCC) -sliding scale insulin with corresponding glucose checks    HTN (hypertension) -resume HTN meds.    HLD (hyperlipidemia) -home dose statin  C. difficile colitis.  Continue vancomycin p.o.  All the records are reviewed and case discussed with Care Management/Social Worker. Management plans discussed with the patient, family and they are in agreement.  CODE STATUS: Full Code  TOTAL TIME TAKING CARE OF THIS PATIENT: 37 minutes.   More than 50% of the time was spent in counseling/coordination of care: YES  POSSIBLE D/C IN 2 DAYS, DEPENDING ON CLINICAL CONDITION.   Shaune Pollack M.D on 03/19/2017 at 2:10 PM  Between 7am to 6pm - Pager - [phone]  After 6pm go to [email or web address] - Therapist, nutritional Hospitalists

## 2017-03-19 NOTE — Progress Notes (Signed)
Dr Sheryle Haildiamond made aware of stool positive for Cdiff.  Enteric Precautions continued.

## 2017-03-19 NOTE — Progress Notes (Signed)
Pt is febrile. Tylenol given earlier with little result. MD notified. Orders to give one dose tylenol at 1gr. I will continue to assess.

## 2017-03-19 NOTE — Progress Notes (Signed)
Pt remains febrile after 650mg  x2 on last nights shift and one gram today on this shift. Now temp is 103. MD notified. MD orders one dose of motrin. I will continue to assess.

## 2017-03-20 LAB — CBC
HEMATOCRIT: 34.7 % — AB (ref 35.0–47.0)
Hemoglobin: 11 g/dL — ABNORMAL LOW (ref 12.0–16.0)
MCH: 21.8 pg — AB (ref 26.0–34.0)
MCHC: 31.8 g/dL — AB (ref 32.0–36.0)
MCV: 68.6 fL — ABNORMAL LOW (ref 80.0–100.0)
Platelets: 149 10*3/uL — ABNORMAL LOW (ref 150–440)
RBC: 5.05 MIL/uL (ref 3.80–5.20)
RDW: 20 % — AB (ref 11.5–14.5)
WBC: 6.9 10*3/uL (ref 3.6–11.0)

## 2017-03-20 LAB — BLOOD GAS, ARTERIAL
ACID-BASE DEFICIT: 2.4 mmol/L — AB (ref 0.0–2.0)
Bicarbonate: 21.1 mmol/L (ref 20.0–28.0)
FIO2: 0.28
O2 SAT: 92.6 %
PATIENT TEMPERATURE: 39.4
PCO2 ART: 31 mmHg — AB (ref 32.0–48.0)
pH, Arterial: 7.4 (ref 7.350–7.450)
pO2, Arterial: 74 mmHg — ABNORMAL LOW (ref 83.0–108.0)

## 2017-03-20 LAB — URINE CULTURE: CULTURE: NO GROWTH

## 2017-03-20 LAB — BASIC METABOLIC PANEL
Anion gap: 11 (ref 5–15)
BUN: 19 mg/dL (ref 6–20)
CALCIUM: 8.1 mg/dL — AB (ref 8.9–10.3)
CO2: 19 mmol/L — ABNORMAL LOW (ref 22–32)
Chloride: 107 mmol/L (ref 101–111)
Creatinine, Ser: 1.28 mg/dL — ABNORMAL HIGH (ref 0.44–1.00)
GFR calc Af Amer: 47 mL/min — ABNORMAL LOW (ref >60)
GFR, EST NON AFRICAN AMERICAN: 40 mL/min — AB (ref >60)
Glucose, Bld: 90 mg/dL (ref 65–99)
Potassium: 3.6 mmol/L (ref 3.5–5.1)
Sodium: 137 mmol/L (ref 135–145)

## 2017-03-20 LAB — GLUCOSE, CAPILLARY
GLUCOSE-CAPILLARY: 123 mg/dL — AB (ref 65–99)
GLUCOSE-CAPILLARY: 117 mg/dL — AB (ref 65–99)
GLUCOSE-CAPILLARY: 129 mg/dL — AB (ref 65–99)
GLUCOSE-CAPILLARY: 124 mg/dL — AB (ref 65–99)
Glucose-Capillary: 162 mg/dL — ABNORMAL HIGH (ref 65–99)

## 2017-03-20 LAB — PROCALCITONIN: PROCALCITONIN: 3.91 ng/mL

## 2017-03-20 MED ORDER — DOXYCYCLINE HYCLATE 100 MG PO TABS
100.0000 mg | ORAL_TABLET | Freq: Two times a day (BID) | ORAL | Status: DC
  Administered 2017-03-20 – 2017-03-21 (×2): 100 mg via ORAL
  Filled 2017-03-20 (×2): qty 1

## 2017-03-20 MED ORDER — LEVOFLOXACIN IN D5W 750 MG/150ML IV SOLN: 750.0000 mg | INTRAVENOUS | Status: DC

## 2017-03-20 MED ORDER — OXYCODONE-ACETAMINOPHEN 5-325 MG PO TABS
1.0000 | ORAL_TABLET | ORAL | Status: DC | PRN
  Administered 2017-03-22 – 2017-03-27 (×5): 1 via ORAL
  Filled 2017-03-20 (×5): qty 1

## 2017-03-20 MED ORDER — MORPHINE SULFATE (PF) 4 MG/ML IV SOLN
4.0000 mg | INTRAVENOUS | Status: DC | PRN
  Administered 2017-03-20 – 2017-03-24 (×7): 4 mg via INTRAVENOUS
  Filled 2017-03-20 (×8): qty 1

## 2017-03-20 NOTE — Care Management (Addendum)
Patient has had a temp as high as 103.Contacted her pharmacy plan through [employer].  Vancomycin,  is a tier 4 drug with plan and cost would be 255 dollars. Patient has met her out of pocket deductible of 160.00. This would place a financial hardship on patient.  [facility] can compound  Vancomycin suspension here and give to patient at discharge provided they have a prescription.

## 2017-03-20 NOTE — Care Management Important Message (Signed)
Important Message  Patient Details  Name: Ann OddiBarbara L Price MRN: [id number] Date of Birth: 10/09/1945   Medicare Important Message Given:  Yes Signed IM notice given  Eber HongGreene, Hue Steveson R, RN 03/20/2017, 7:46 AM

## 2017-03-20 NOTE — Plan of Care (Signed)
Temp trending down to normal.

## 2017-03-20 NOTE — Progress Notes (Signed)
CH received consult to offer prayer for patient. [facility] CH provided silent prayer for patient outside room. CH will continue to round and check in on patient through out shift.

## 2017-03-20 NOTE — Significant Event (Addendum)
Rapid Response Event Note  Overview: Time Called: 1818 Arrival Time: 1820 Event Type: Neurologic  Initial Focused Assessment: This RN arrived to room and found patient with arms in air, moaning and repeatedly asking for help. This RN asked the patient to state her name, where she was and what was going on. Patient was able to answer questions appropriately. Patient found to be slightly hypoxic at 88%, placed on 2L [place] by RT. Vital signs obtained, see flowsheet. Patient able to stick out tongue and smile with facial symmetry intact. Patient able to grip with both hands, dorsiflex and plantar flex bilateral with strong strength. PO Acetaminophen had been administered prior to this RN arrival at 1739. Left FA PIV had infiltrated with an antibiotic prior to this RN arrival.   Interventions: ABG, placed on cardiac monitor, ice packs placed to axilla and groin to help in fever reduction. Second peripheral IV placed.  Plan of Care (if not transferred): MD Imogene Burnhen made aware. Night shift hospitalist to round on patient at start on shift. Left patient on 2L [place]. Asked primary RN to call pharmacy and get accurate treatment for antibiotic infiltration. Educated primary RN to leave ice packs on for no more than 20 minutes at a time. Also, advised primary RN to administer acetaminophen rectally if patient able to tolerate.   Event Summary: Name of Physician Notified: MD Imogene Burnhen at 1818    at    Outcome: Stayed in room and stabalized  Event End Time: 1840  Hyman Boweraylor S Gracyn Santillanes

## 2017-03-20 NOTE — Progress Notes (Signed)
   03/20/17 1821  Clinical Encounter Type  Visited With Family;Patient not available  Visit Type Code (rapid response)   Chaplain responded to page for rapid response.  Chaplain offered emotional support to patient family member, offered silent prayer for patient and health care team.  Chaplain received page for code STEMI, concluded visit with patient family member.

## 2017-03-20 NOTE — Progress Notes (Signed)
Dr. Imogene Burnhen notified via text page of pt's temp of 103.2 F. Will give prn tylenol per order.

## 2017-03-20 NOTE — Progress Notes (Signed)
[employer] - [place] at [facility]   PATIENT NAME: Ann Price    MR#:  [id number]  DATE OF BIRTH:  [birth year]  SUBJECTIVE:  CHIEF COMPLAINT:   Chief Complaint  Patient presents with  . Fever  . Generalized Body Aches   The patient still has fever (102.5 last night), cough with sputum, generalized weakness, abdominal pain and watery diarrhea, REVIEW OF SYSTEMS:  Review of Systems  Constitutional: Positive for fever and malaise/fatigue. Negative for chills.  HENT: Negative for sore throat.   Eyes: Negative for blurred vision and double vision.  Respiratory: Positive for cough and sputum production. Negative for hemoptysis, shortness of breath, wheezing and stridor.   Cardiovascular: Negative for chest pain, palpitations, orthopnea and leg swelling.  Gastrointestinal: Positive for abdominal pain and diarrhea. Negative for blood in stool, melena, nausea and vomiting.  Genitourinary: Negative for dysuria, flank pain and hematuria.  Musculoskeletal: Negative for back pain and joint pain.  Skin: Negative for rash.  Neurological: Positive for weakness. Negative for dizziness, sensory change, focal weakness, seizures, loss of consciousness and headaches.  Endo/Heme/Allergies: Negative for polydipsia.  Psychiatric/Behavioral: Negative for depression. The patient is not nervous/anxious.     DRUG ALLERGIES:   Allergies  Allergen Reactions  . Aspirin Itching  . Penicillins Other (See Comments)    Stomach burns Has patient had a PCN reaction causing immediate rash, facial/tongue/throat swelling, SOB or lightheadedness with hypotension: No Has patient had a PCN reaction causing severe rash involving mucus membranes or skin necrosis: No Has patient had a PCN reaction that required hospitalization: No Has patient had a PCN reaction occurring within the last 10 years: No If all of the above answers are "NO", then may proceed with Cephalosporin use.    VITALS:    Blood pressure (!) 140/45, pulse 88, temperature (!) 100.5 F (38.1 C), temperature source Oral, resp. rate 18, height 5\' 3"  (1.6 m), weight 202 lb 11.2 oz (91.9 kg), SpO2 94 %. PHYSICAL EXAMINATION:  Physical Exam  Constitutional: She is oriented to person, place, and time and well-developed, well-nourished, and in no distress.  Obesity.  HENT:  Head: Normocephalic.  Mouth/Throat: Oropharynx is clear and moist.  Eyes: Conjunctivae and EOM are normal. Pupils are equal, round, and reactive to light. No scleral icterus.  Neck: Normal range of motion. Neck supple. No JVD present. No tracheal deviation present.  Cardiovascular: Normal rate, regular rhythm and normal heart sounds. Exam reveals no gallop.  No murmur heard. Pulmonary/Chest: Effort normal and breath sounds normal. No respiratory distress. She has no wheezes. She has no rales.  Abdominal: Soft. Bowel sounds are normal. She exhibits no distension. There is tenderness. There is no rebound.  Musculoskeletal: Normal range of motion. She exhibits no edema or tenderness.  Neurological: She is alert and oriented to person, place, and time. No cranial nerve deficit.  Skin: No rash noted. No erythema.  Psychiatric: Affect normal.   LABORATORY PANEL:  Female CBC Recent Labs  Lab 03/20/17 0503  WBC 6.9  HGB 11.0*  HCT 34.7*  PLT 149*   ------------------------------------------------------------------------------------------------------------------ Chemistries  Recent Labs  Lab 03/18/17 1915  03/20/17 0503  NA 133*   < > 137  K 4.0   < > 3.6  CL 102   < > 107  CO2 20*   < > 19*  GLUCOSE 210*   < > 90  BUN 21*   < > 19  CREATININE 1.76*   < > 1.28*  CALCIUM 8.3*   < >  8.1*  AST 54*  --   --   ALT 26  --   --   ALKPHOS 65  --   --   BILITOT 0.7  --   --    < > = values in this interval not displayed.   RADIOLOGY:  No results found. ASSESSMENT AND PLAN:   Sepsis due to CAP Continue Zithromax and Rocephin, blood  cultures are negative so far.    AKI (acute kidney injury) Improved with IV fluids, avoid nephrotoxins.    Diabetes (HCC) -sliding scale insulin with corresponding glucose checks    HTN (hypertension) -resume HTN meds.    HLD (hyperlipidemia) -home dose statin  C. difficile colitis.  Continue vancomycin p.o.  All the records are reviewed and case discussed with Care Management/Social Worker. Management plans discussed with the patient, family and they are in agreement.  CODE STATUS: Full Code  TOTAL TIME TAKING CARE OF THIS PATIENT: 36 minutes.   More than 50% of the time was spent in counseling/coordination of care: YES  POSSIBLE D/C IN 2-3 DAYS, DEPENDING ON CLINICAL CONDITION.   Shaune PollackQing Dmarion Perfect M.D on 03/20/2017 at 1:58 PM  Between 7am to 6pm - Pager - [phone]  After 6pm go to [email or web address] - Therapist, nutritionalpassword [facility]AS [facility]  [employer] [place] [facility]

## 2017-03-20 NOTE — Progress Notes (Signed)
This RN called rapid response due to pt's high temperature and agitated status. Dr. Imogene Burnhen notified and prime doc called. ABG ordered.  IV zithromax infiltrated in pt's L arm, PIV removed and cold compress applied per pharmacy.

## 2017-03-20 NOTE — Plan of Care (Signed)
Pt is A&Ox4. Temp elevated this shift, other VSS, Dr. Imogene Burnhen notified and prn tylenol given per order, along with orders placed for stat blood cultures and a change in antibiotic orders. IV and PO antibiotics given this shift per orders[facility] Family at bedside. Enteric isolation precautions maintained. Pt c/o abdominal pain, relieved with prn morphine per order. Will continue to monitor and report to oncoming RN .  Not Progressing Education: Knowledge of General Education information will improve 03/20/2017 1745 - Not Progressing by Jodie EchevariaWhite, Rhoda Waldvogel L, RN Health Behavior/Discharge Planning: Ability to manage health-related needs will improve 03/20/2017 1745 - Not Progressing by Jodie EchevariaWhite, Emmagrace Runkel L, RN Clinical Measurements: Ability to maintain clinical measurements within normal limits will improve 03/20/2017 1745 - Not Progressing by Jodie EchevariaWhite, Candyce Gambino L, RN Will remain free from infection 03/20/2017 1745 - Not Progressing by Jodie EchevariaWhite, Maksymilian Mabey L, RN Diagnostic test results will improve 03/20/2017 1745 - Not Progressing by Jodie EchevariaWhite, Jearlene Bridwell L, RN Respiratory complications will improve 03/20/2017 1745 - Not Progressing by Jodie EchevariaWhite, Gicela Schwarting L, RN Cardiovascular complication will be avoided 03/20/2017 1745 - Not Progressing by Jodie EchevariaWhite, Rickie Gange L, RN Activity: Risk for activity intolerance will decrease 03/20/2017 1745 - Not Progressing by Jodie EchevariaWhite, Cayman Kielbasa L, RN Nutrition: Adequate nutrition will be maintained 03/20/2017 1745 - Not Progressing by Jodie EchevariaWhite, Heatherly Stenner L, RN Coping: Level of anxiety will decrease 03/20/2017 1745 - Not Progressing by Jodie EchevariaWhite, Shanah Guimaraes L, RN Elimination: Will not experience complications related to bowel motility 03/20/2017 1745 - Not Progressing by Jodie EchevariaWhite, Jenisa Monty L, RN Will not experience complications related to urinary retention 03/20/2017 1745 - Not Progressing by Jodie EchevariaWhite, Hayden Kihara L, RN Pain Managment: General experience of comfort will improve 03/20/2017 1745 - Not Progressing by Jodie EchevariaWhite, Chevon Fomby L,  RN Safety: Ability to remain free from injury will improve 03/20/2017 1745 - Not Progressing by Jodie EchevariaWhite, Mikel Hardgrove L, RN Skin Integrity: Risk for impaired skin integrity will decrease 03/20/2017 1745 - Not Progressing by Jodie EchevariaWhite, Amarri Michaelson L, RN Fluid Volume: Hemodynamic stability will improve 03/20/2017 1745 - Not Progressing by Jodie EchevariaWhite, Jasdeep Kepner L, RN Clinical Measurements: Signs and symptoms of infection will decrease 03/20/2017 1745 - Not Progressing by Jodie EchevariaWhite, Russie Gulledge L, RN Respiratory: Ability to maintain adequate ventilation will improve 03/20/2017 1745 - Not Progressing by Jodie EchevariaWhite, Ladanian Kelter L, RN Ability to maintain a clear airway will improve 03/20/2017 1745 - Not Progressing by Jodie EchevariaWhite, Toshia Larkin L, RN

## 2017-03-21 LAB — CBC
HEMATOCRIT: 31.1 % — AB (ref 35.0–47.0)
Hemoglobin: 9.8 g/dL — ABNORMAL LOW (ref 12.0–16.0)
MCH: 21.1 pg — AB (ref 26.0–34.0)
MCHC: 31.5 g/dL — AB (ref 32.0–36.0)
MCV: 66.9 fL — AB (ref 80.0–100.0)
Platelets: 156 10*3/uL (ref 150–440)
RBC: 4.65 MIL/uL (ref 3.80–5.20)
RDW: 19.3 % — ABNORMAL HIGH (ref 11.5–14.5)
WBC: 6.5 10*3/uL (ref 3.6–11.0)

## 2017-03-21 LAB — GLUCOSE, CAPILLARY
GLUCOSE-CAPILLARY: 133 mg/dL — AB (ref 65–99)
Glucose-Capillary: 92 mg/dL (ref 65–99)
Glucose-Capillary: 133 mg/dL — ABNORMAL HIGH (ref 65–99)
Glucose-Capillary: 104 mg/dL — ABNORMAL HIGH (ref 65–99)

## 2017-03-21 LAB — PROCALCITONIN: PROCALCITONIN: 5.5 ng/mL

## 2017-03-21 LAB — MRSA PCR SCREENING: MRSA by PCR: NEGATIVE

## 2017-03-21 MED ORDER — PIPERACILLIN-TAZOBACTAM 3.375 G IVPB
3.3750 g | Freq: Three times a day (TID) | INTRAVENOUS | Status: DC
  Administered 2017-03-21 – 2017-03-24 (×9): 3.375 g via INTRAVENOUS
  Filled 2017-03-21 (×9): qty 50

## 2017-03-21 NOTE — Progress Notes (Signed)
Pharmacy Antibiotic Note  Ann Price is a 72 y.o. female admitted on 03/18/2017 with aspiration pneumonia.  Pharmacy has been consulted for Zosyn dosing.  Plan: Zosyn 3.375 gm IV Q8H EI  Height: 5\' 3"  (160 cm) Weight: 204 lb 6.4 oz (92.7 kg) IBW/kg (Calculated) : 52.4  Temp (24hrs), Avg:101.9 F (38.8 C), Min:100 F (37.8 C), Max:103.2 F (39.6 C)  Recent Labs  Lab 03/18/17 1915 03/18/17 1955 03/18/17 2153 03/19/17 0322 03/20/17 0503 03/21/17 1227  WBC 8.7  --   --  8.0 6.9 6.5  CREATININE 1.76*  --   --  1.52* 1.28*  --   LATICACIDVEN  --  1.2 1.0  --   --   --     Estimated Creatinine Clearance: 42.3 mL/min (A) (by C-G formula based on SCr of 1.28 mg/dL (H)).    Allergies  Allergen Reactions  . Aspirin Itching  . Penicillins Other (See Comments)    Stomach burns Has patient had a PCN reaction causing immediate rash, facial/tongue/throat swelling, SOB or lightheadedness with hypotension: No Has patient had a PCN reaction causing severe rash involving mucus membranes or skin necrosis: No Has patient had a PCN reaction that required hospitalization: No Has patient had a PCN reaction occurring within the last 10 years: No If all of the above answers are "NO", then may proceed with Cephalosporin use.     Antimicrobials this admission: Azithromycin/Ceftriaxone, oral doxycycline, now Zosyn  Dose adjustments this admission:   Microbiology results: 03/20/17 BCx: pending UCx: N/A  Sputum: N/A  03/21/17 MRSA PCR: pending  Thank you for allowing pharmacy to be a part of this patient's care.  Carola FrostNathan A Mizraim Harmening, Pharm.D., BCPS Clinical Pharmacist 03/21/2017 1:50 PM

## 2017-03-21 NOTE — Progress Notes (Addendum)
ID E Note REviewed chart and labs imaging.  Admitted with cough, malaise fever. CXR with LLL PNA. C diff PCR +, GI panel neg. BCX neg. Flu PCR neg. LFTS nml, UA neg.  Procalcitoin elevated and increasing   ABx course S/p ceftraixone x 2 days Vanco oral  x 3 days, azithromycin x 3 days PCN intolerant with GI side effects.   REC Would check MRSA PCR and Resp PCR. See if can obtain sputum cx. Broaden abx to zosyn (to cover anaerobes and pseduomonas)  If MRSA PCR + add IV vanco Cont oral vanco for C diff She has finished treatment for atypical PNA so no obvious need for Doxy. IF still febrile tomorrow would consider CT Chest and abd pelvis

## 2017-03-21 NOTE — Progress Notes (Signed)
Pt feels warm to touch and she is becoming agitated. Temp 103. Med with tylenol and ice pack applied to axilla. Dr. Imogene Burnhen made aware.

## 2017-03-21 NOTE — Progress Notes (Signed)
Temp came down to 100 degrees. Patient is resting quietly at this time. Will continue to monitor.

## 2017-03-21 NOTE — Progress Notes (Addendum)
[facility] - [place] at [facility]   PATIENT NAME: Ann Price    MR#:  [id number]  DATE OF BIRTH:  1945/08/05  SUBJECTIVE:  CHIEF COMPLAINT:   Chief Complaint  Patient presents with  . Fever  . Generalized Body Aches   The patient still has fever 103 this am, cough with sputum, generalized weakness, abdominal pain and watery diarrhea,x3 times today. REVIEW OF SYSTEMS:  Review of Systems  Constitutional: Positive for fever and malaise/fatigue. Negative for chills.  HENT: Negative for sore throat.   Eyes: Negative for blurred vision and double vision.  Respiratory: Positive for cough and sputum production. Negative for hemoptysis, shortness of breath, wheezing and stridor.   Cardiovascular: Negative for chest pain, palpitations, orthopnea and leg swelling.  Gastrointestinal: Positive for abdominal pain and diarrhea. Negative for blood in stool, melena, nausea and vomiting.  Genitourinary: Negative for dysuria, flank pain and hematuria.  Musculoskeletal: Negative for back pain and joint pain.  Skin: Negative for rash.  Neurological: Positive for weakness. Negative for dizziness, sensory change, focal weakness, seizures, loss of consciousness and headaches.  Endo/Heme/Allergies: Negative for polydipsia.  Psychiatric/Behavioral: Negative for depression. The patient is not nervous/anxious.     DRUG ALLERGIES:   Allergies  Allergen Reactions  . Aspirin Itching  . Penicillins Other (See Comments)    Stomach burns Has patient had a PCN reaction causing immediate rash, facial/tongue/throat swelling, SOB or lightheadedness with hypotension: No Has patient had a PCN reaction causing severe rash involving mucus membranes or skin necrosis: No Has patient had a PCN reaction that required hospitalization: No Has patient had a PCN reaction occurring within the last 10 years: No If all of the above answers are "NO", then may proceed with Cephalosporin use.     VITALS:  Blood pressure (!) 120/41, pulse 90, temperature 99.1 F (37.3 C), temperature source Oral, resp. rate 20, height 5\' 3"  (1.6 m), weight 204 lb 6.4 oz (92.7 kg), SpO2 95 %. PHYSICAL EXAMINATION:  Physical Exam  Constitutional: She is oriented to person, place, and time and well-developed, well-nourished, and in no distress.  Obesity.  HENT:  Head: Normocephalic.  Mouth/Throat: Oropharynx is clear and moist.  Eyes: Conjunctivae and EOM are normal. Pupils are equal, round, and reactive to light. No scleral icterus.  Neck: Normal range of motion. Neck supple. No JVD present. No tracheal deviation present.  Cardiovascular: Normal rate, regular rhythm and normal heart sounds. Exam reveals no gallop.  No murmur heard. Pulmonary/Chest: Effort normal and breath sounds normal. No respiratory distress. She has no wheezes. She has no rales.  Abdominal: Soft. Bowel sounds are normal. She exhibits no distension. There is tenderness. There is no rebound.  Musculoskeletal: Normal range of motion. She exhibits no edema or tenderness.  Neurological: She is alert and oriented to person, place, and time. No cranial nerve deficit.  Skin: No rash noted. No erythema.  Psychiatric: Affect normal.   LABORATORY PANEL:  Female CBC Recent Labs  Lab 03/21/17 1227  WBC 6.5  HGB 9.8*  HCT 31.1*  PLT 156   ------------------------------------------------------------------------------------------------------------------ Chemistries  Recent Labs  Lab 03/18/17 1915  03/20/17 0503  NA 133*   < > 137  K 4.0   < > 3.6  CL 102   < > 107  CO2 20*   < > 19*  GLUCOSE 210*   < > 90  BUN 21*   < > 19  CREATININE 1.76*   < > 1.28*  CALCIUM 8.3*   < >  8.1*  AST 54*  --   --   ALT 26  --   --   ALKPHOS 65  --   --   BILITOT 0.7  --   --    < > = values in this interval not displayed.   RADIOLOGY:  No results found. ASSESSMENT AND PLAN:   Sepsis due to CAP Dsontinued Zithromax and Rocephin,  changed to doxycycline by on-call physician.  Blood cultures are negative so far. Per Dr. Sherrie MustacheFisher, she has finished treatment for atypical PNA so no obvious need for Doxy. Broaden abx to zosyn (to cover anaerobes and pseduomonas)  If MRSA PCR + add IV vanco, IF still febrile tomorrow would consider CT Chest and abd pelvis per Dr. Sampson GoonFitzgerald.  ID consult appreciated.  C. difficile colitis.  Continue vancomycin p.o.  AKI (acute kidney injury) Improved with IV fluids, avoid nephrotoxins.    Diabetes (HCC) -sliding scale insulin with corresponding glucose checks    HTN (hypertension) -hold Norvasc and hydralazine.   Hold atenolol if blood pressure is low.    HLD (hyperlipidemia) -home dose statin  I called her husband, nobody answered the phone. All the records are reviewed and case discussed with Care Management/Social Worker. Management plans discussed with the patient, family and they are in agreement.  CODE STATUS: Full Code  TOTAL TIME TAKING CARE OF THIS PATIENT: 37 minutes.   More than 50% of the time was spent in counseling/coordination of care: YES  POSSIBLE D/C IN 2-3 DAYS, DEPENDING ON CLINICAL CONDITION.   Shaune PollackQing Daanish Copes M.D on 03/21/2017 at 4:15 PM  Between 7am to 6pm - Pager - [phone]  After 6pm go to [email or web address] - Therapist, nutritionalpassword EPAS [facility]  [facility] [place] [facility]

## 2017-03-21 NOTE — Progress Notes (Signed)
Patient spiked temp of 103 .2, Tylenol 650 mg oral administered and ice packs applied to the groin and armpit.  Dr. Sheryle Hailiamond notified if there is anything new to be done. No new order received.  No acute distress noted. Will continue to monitor.

## 2017-03-22 ENCOUNTER — Inpatient Hospital Stay: Payer: Medicare HMO

## 2017-03-22 LAB — GLUCOSE, CAPILLARY
GLUCOSE-CAPILLARY: 102 mg/dL — AB (ref 65–99)
GLUCOSE-CAPILLARY: 86 mg/dL (ref 65–99)
GLUCOSE-CAPILLARY: 121 mg/dL — AB (ref 65–99)
GLUCOSE-CAPILLARY: 81 mg/dL (ref 65–99)

## 2017-03-22 LAB — RESPIRATORY PANEL BY PCR
Adenovirus: DETECTED — AB
BORDETELLA PERTUSSIS-RVPCR: NOT DETECTED
CORONAVIRUS 229E-RVPPCR: NOT DETECTED
CORONAVIRUS HKU1-RVPPCR: NOT DETECTED
Chlamydophila pneumoniae: NOT DETECTED
Coronavirus NL63: NOT DETECTED
Coronavirus OC43: NOT DETECTED
INFLUENZA B-RVPPCR: NOT DETECTED
Influenza A: NOT DETECTED
METAPNEUMOVIRUS-RVPPCR: NOT DETECTED
MYCOPLASMA PNEUMONIAE-RVPPCR: NOT DETECTED
PARAINFLUENZA VIRUS 1-RVPPCR: NOT DETECTED
PARAINFLUENZA VIRUS 2-RVPPCR: NOT DETECTED
Parainfluenza Virus 3: NOT DETECTED
Parainfluenza Virus 4: NOT DETECTED
RESPIRATORY SYNCYTIAL VIRUS-RVPPCR: NOT DETECTED
Rhinovirus / Enterovirus: NOT DETECTED

## 2017-03-22 MED ORDER — IOPAMIDOL (ISOVUE-300) INJECTION 61%: 15.0000 mL | INTRAVENOUS | Status: AC

## 2017-03-22 NOTE — Progress Notes (Addendum)
[facility] - [place] at [facility]   PATIENT NAME: Trina AoBarbara Hoog    MR#:  [id number]  DATE OF BIRTH:  01/06/1946  SUBJECTIVE:  CHIEF COMPLAINT:   Chief Complaint  Patient presents with  . Fever  . Generalized Body Aches   The patient low grade fever 100.1, cough with sputum, generalized weakness, abdominal pain and loose diarrhea,x1 times today. REVIEW OF SYSTEMS:  Review of Systems  Constitutional: Positive for fever and malaise/fatigue. Negative for chills.  HENT: Negative for sore throat.   Eyes: Negative for blurred vision and double vision.  Respiratory: Positive for cough and sputum production. Negative for hemoptysis, shortness of breath, wheezing and stridor.   Cardiovascular: Negative for chest pain, palpitations, orthopnea and leg swelling.  Gastrointestinal: Positive for abdominal pain and diarrhea. Negative for blood in stool, melena, nausea and vomiting.  Genitourinary: Negative for dysuria, flank pain and hematuria.  Musculoskeletal: Negative for back pain and joint pain.  Skin: Negative for rash.  Neurological: Positive for weakness. Negative for dizziness, sensory change, focal weakness, seizures, loss of consciousness and headaches.  Endo/Heme/Allergies: Negative for polydipsia.  Psychiatric/Behavioral: Negative for depression. The patient is not nervous/anxious.     DRUG ALLERGIES:   Allergies  Allergen Reactions  . Aspirin Itching  . Penicillins Other (See Comments)    Stomach burns Has patient had a PCN reaction causing immediate rash, facial/tongue/throat swelling, SOB or lightheadedness with hypotension: No Has patient had a PCN reaction causing severe rash involving mucus membranes or skin necrosis: No Has patient had a PCN reaction that required hospitalization: No Has patient had a PCN reaction occurring within the last 10 years: No If all of the above answers are "NO", then may proceed with Cephalosporin use.    VITALS:    Blood pressure (!) 117/56, pulse 83, temperature 100.1 F (37.8 C), temperature source Oral, resp. rate 18, height 5\' 3"  (1.6 m), weight 202 lb 8 oz (91.9 kg), SpO2 94 %. PHYSICAL EXAMINATION:  Physical Exam  Constitutional: She is oriented to person, place, and time and well-developed, well-nourished, and in no distress.  Obesity.  HENT:  Head: Normocephalic.  Mouth/Throat: Oropharynx is clear and moist.  Eyes: Conjunctivae and EOM are normal. Pupils are equal, round, and reactive to light. No scleral icterus.  Neck: Normal range of motion. Neck supple. No JVD present. No tracheal deviation present.  Cardiovascular: Normal rate, regular rhythm and normal heart sounds. Exam reveals no gallop.  No murmur heard. Pulmonary/Chest: Effort normal and breath sounds normal. No respiratory distress. She has no wheezes. She has no rales.  Abdominal: Soft. Bowel sounds are normal. She exhibits no distension. There is tenderness. There is no rebound.  Musculoskeletal: Normal range of motion. She exhibits no edema or tenderness.  Neurological: She is alert and oriented to person, place, and time. No cranial nerve deficit.  Skin: No rash noted. No erythema.  Psychiatric: Affect normal.   LABORATORY PANEL:  Female CBC Recent Labs  Lab 03/21/17 1227  WBC 6.5  HGB 9.8*  HCT 31.1*  PLT 156   ------------------------------------------------------------------------------------------------------------------ Chemistries  Recent Labs  Lab 03/18/17 1915  03/20/17 0503  NA 133*   < > 137  K 4.0   < > 3.6  CL 102   < > 107  CO2 20*   < > 19*  GLUCOSE 210*   < > 90  BUN 21*   < > 19  CREATININE 1.76*   < > 1.28*  CALCIUM 8.3*   < >  8.1*  AST 54*  --   --   ALT 26  --   --   ALKPHOS 65  --   --   BILITOT 0.7  --   --    < > = values in this interval not displayed.   RADIOLOGY:  No results found. ASSESSMENT AND PLAN:   Sepsis due to CAP Dsontinued Zithromax and Rocephin, changed to  doxycycline by on-call physician.  Blood cultures are negative so far. Per Dr. Sherrie Mustache, she has finished treatment for atypical PNA so no obvious need for Doxy. Broaden abx to zosyn (to cover anaerobes and pseduomonas)  If MRSA PCR + add IV vanco, IF still febrile tomorrow would consider CT Chest and abd pelvis per Dr. Sampson Goon.  ID consult appreciated.  Continue Zosyn.  CT Chest and abd pelvis.  C. difficile colitis.  Continue vancomycin p.o.  AKI (acute kidney injury) Improved with IV fluids, avoid nephrotoxins.    Diabetes (HCC) -sliding scale insulin with corresponding glucose checks    HTN (hypertension) -hold Norvasc and hydralazine.   Hold atenolol if blood pressure is low.    HLD (hyperlipidemia) -home dose statin  I called her husband, nobody answered the phone. All the records are reviewed and case discussed with Care Management/Social Worker. Management plans discussed with the patient, her husband and son and they are in agreement.  CODE STATUS: Full Code  TOTAL TIME TAKING CARE OF THIS PATIENT: 38 minutes.   More than 50% of the time was spent in counseling/coordination of care: YES  POSSIBLE D/C IN 2 DAYS, DEPENDING ON CLINICAL CONDITION.   Shaune Pollack M.D on 03/22/2017 at 3:36 PM  Between 7am to 6pm - Pager - [phone]  After 6pm go to [email or web address] - Therapist, nutritional Hospitalists

## 2017-03-23 LAB — CULTURE, BLOOD (ROUTINE X 2)
Culture: NO GROWTH
Culture: NO GROWTH
SPECIAL REQUESTS: ADEQUATE
Special Requests: ADEQUATE

## 2017-03-23 LAB — GLUCOSE, CAPILLARY
GLUCOSE-CAPILLARY: 116 mg/dL — AB (ref 65–99)
Glucose-Capillary: 72 mg/dL (ref 65–99)
Glucose-Capillary: 89 mg/dL (ref 65–99)
Glucose-Capillary: 126 mg/dL — ABNORMAL HIGH (ref 65–99)

## 2017-03-23 MED ORDER — VANCOMYCIN HCL IN DEXTROSE 1-5 GM/200ML-% IV SOLN
1000.0000 mg | Freq: Once | INTRAVENOUS | Status: AC
  Administered 2017-03-23: 1000 mg via INTRAVENOUS
  Filled 2017-03-23: qty 200

## 2017-03-23 MED ORDER — VANCOMYCIN HCL IN DEXTROSE 1-5 GM/200ML-% IV SOLN
1000.0000 mg | INTRAVENOUS | Status: DC
  Administered 2017-03-23: 1000 mg via INTRAVENOUS
  Filled 2017-03-23 (×2): qty 200

## 2017-03-23 MED ORDER — SODIUM CHLORIDE 0.9 % IV SOLN
INTRAVENOUS | Status: AC
Start: 2017-03-23 — End: 2017-03-24
  Administered 2017-03-23: 12:00:00 via INTRAVENOUS

## 2017-03-23 NOTE — Progress Notes (Addendum)
[facility] - [place] at [facility]   PATIENT NAME: Ann AoBarbara Price    MR#:  [id number]  DATE OF BIRTH:  02/15/1945  SUBJECTIVE:  CHIEF COMPLAINT:   Chief Complaint  Patient presents with  . Fever  . Generalized Body Aches   The patient low grade fever 102.8 last nigh, cough with sputum, generalized weakness, abdominal pain and loose diarrhea,x1 times today. Still poor oral intake. REVIEW OF SYSTEMS:  Review of Systems  Constitutional: Positive for fever and malaise/fatigue. Negative for chills.  HENT: Negative for sore throat.   Eyes: Negative for blurred vision and double vision.  Respiratory: Positive for cough and sputum production. Negative for hemoptysis, shortness of breath, wheezing and stridor.   Cardiovascular: Negative for chest pain, palpitations, orthopnea and leg swelling.  Gastrointestinal: Positive for abdominal pain and diarrhea. Negative for blood in stool, melena, nausea and vomiting.  Genitourinary: Negative for dysuria, flank pain and hematuria.  Musculoskeletal: Negative for back pain and joint pain.  Skin: Negative for rash.  Neurological: Positive for weakness. Negative for dizziness, sensory change, focal weakness, seizures, loss of consciousness and headaches.  Endo/Heme/Allergies: Negative for polydipsia.  Psychiatric/Behavioral: Negative for depression. The patient is not nervous/anxious.     DRUG ALLERGIES:   Allergies  Allergen Reactions  . Aspirin Itching  . Penicillins Other (See Comments)    Stomach burns Has patient had a PCN reaction causing immediate rash, facial/tongue/throat swelling, SOB or lightheadedness with hypotension: No Has patient had a PCN reaction causing severe rash involving mucus membranes or skin necrosis: No Has patient had a PCN reaction that required hospitalization: No Has patient had a PCN reaction occurring within the last 10 years: No If all of the above answers are "NO", then may proceed with  Cephalosporin use.    VITALS:  Blood pressure (!) 132/52, pulse 64, temperature (!) 101.4 F (38.6 C), temperature source Oral, resp. rate 19, height 5\' 3"  (1.6 m), weight 202 lb 8 oz (91.9 kg), SpO2 98 %. PHYSICAL EXAMINATION:  Physical Exam  Constitutional: She is oriented to person, place, and time and well-developed, well-nourished, and in no distress.  Obesity.  HENT:  Head: Normocephalic.  Mouth/Throat: Oropharynx is clear and moist.  Eyes: Conjunctivae and EOM are normal. Pupils are equal, round, and reactive to light. No scleral icterus.  Neck: Normal range of motion. Neck supple. No JVD present. No tracheal deviation present.  Cardiovascular: Normal rate, regular rhythm and normal heart sounds. Exam reveals no gallop.  No murmur heard. Pulmonary/Chest: Effort normal and breath sounds normal. No respiratory distress. She has no wheezes. She has no rales.  Abdominal: Soft. Bowel sounds are normal. She exhibits no distension. There is tenderness. There is no rebound.  Musculoskeletal: Normal range of motion. She exhibits no edema or tenderness.  Neurological: She is alert and oriented to person, place, and time. No cranial nerve deficit.  Skin: No rash noted. No erythema.  Psychiatric: Affect normal.   LABORATORY PANEL:  Female CBC Recent Labs  Lab 03/21/17 1227  WBC 6.5  HGB 9.8*  HCT 31.1*  PLT 156   ------------------------------------------------------------------------------------------------------------------ Chemistries  Recent Labs  Lab 03/18/17 1915  03/20/17 0503  NA 133*   < > 137  K 4.0   < > 3.6  CL 102   < > 107  CO2 20*   < > 19*  GLUCOSE 210*   < > 90  BUN 21*   < > 19  CREATININE 1.76*   < >  1.28*  CALCIUM 8.3*   < > 8.1*  AST 54*  --   --   ALT 26  --   --   ALKPHOS 65  --   --   BILITOT 0.7  --   --    < > = values in this interval not displayed.   RADIOLOGY:  Ct Abdomen Pelvis Wo Contrast  Result Date: 03/22/2017 CLINICAL DATA:   Abdominal pain. Gastroenteritis or colitis suspected. EXAM: CT ABDOMEN WITHOUT CONTRAST TECHNIQUE: Multidetector CT imaging of the abdomen was performed following the standard protocol without IV contrast. COMPARISON:  Two-view chest x-ray 03/18/2017 FINDINGS: Lower chest: Left lower lobe consolidated pneumonia is confirmed. Ill-defined airspace disease is present in the right lower lobe as well. The heart is mildly enlarged. No significant pleural or pericardial effusion is present. Hepatobiliary: No focal liver abnormality is seen. No gallstones, gallbladder wall thickening, or biliary dilatation. Pancreas: Unremarkable. No pancreatic ductal dilatation or surrounding inflammatory changes. Spleen: Normal in size without focal abnormality. Adrenals/Urinary Tract: Adrenal glands are normal bilaterally. Kidneys and ureters are within normal limits. Stomach/Bowel: The stomach and duodenum are within normal limits. Fluid levels are present within nondilated loops of small bowel. There is no focal obstruction. Visualized colon is within normal limits. Vascular/Lymphatic: Vascular calcifications are present in the aorta and branch vessels without aneurysm. Other: No abdominal wall hernia or abnormality. Musculoskeletal: Slight anterolisthesis at L4-5 is degenerative. There is uncovering of a broad-based disc protrusion at this level. This results in mild foraminal narrowing bilaterally. Mild foraminal narrowing is also present at L3-4. Vertebral body heights are maintained. No focal lytic or blastic lesions are present. IMPRESSION: 1. Left lower lobe consolidated pneumonia. 2. Ill-defined airspace disease in the right lower lobe likely also represents infection. 3. Fluid levels within nondilated loops of small bowel may represent ileus. There is no focal obstruction. 4. Degenerative changes of the lumbar spine with foraminal narrowing bilaterally at L3-4 and L4-5. Electronically Signed   By: Marin Roberts M.D.    On: 03/22/2017 20:13   ASSESSMENT AND PLAN:   Sepsis due to CAP, high fever. Dsontinued Zithromax and Rocephin, changed to doxycycline by on-call physician.  Blood cultures are negative so far. Per Dr. Sherrie Mustache, she has finished treatment for atypical PNA so no obvious need for Doxy. Broaden abx to zosyn (to cover anaerobes and pseduomonas)  If MRSA PCR + add IV vanco, IF still febrile tomorrow would consider CT Chest and abd pelvis per Dr. Sampson Goon.  ID consult appreciated.  Continue Zosyn.  CT Chest and abd pelvis: Pneumonia.  Suspect ileus.  No obstruction. Respiration panel show adenovirus. High fevers possible due to adenovirus infection. May discontinue Zosyn if Dr. Sampson Goon agrees.  C. difficile colitis.  Continue vancomycin p.o.  AKI (acute kidney injury) Improved with IV fluids, avoid nephrotoxins.     Diabetes (HCC) -sliding scale insulin with corresponding glucose checks    HTN (hypertension) -hold Norvasc and hydralazine.   Hold atenolol if blood pressure is low.    HLD (hyperlipidemia) -home dose statin  All the records are reviewed and case discussed with Care Management/Social Worker. Management plans discussed with the patient, her son and they are in agreement.  CODE STATUS: Full Code  TOTAL TIME TAKING CARE OF THIS PATIENT: 36 minutes.   More than 50% of the time was spent in counseling/coordination of care: YES  POSSIBLE D/C IN 2-3 DAYS, DEPENDING ON CLINICAL CONDITION.   Shaune Pollack M.D on 03/23/2017 at 2:46 PM  Between 7am to 6pm - Pager - [phone]  After 6pm go to [email or web address] - [employer] [facility]

## 2017-03-23 NOTE — Consult Note (Signed)
[facility] Infectious Disease     Reason for Consult:Fever, PNA   Referring Physician: Estanislado Spire Date of Admission:  03/18/2017   Principal Problem:   Sepsis ([place]) Active Problems:   CAP (community acquired pneumonia)   Diabetes ([place])   HTN (hypertension)   HLD (hyperlipidemia)   AKI (acute kidney injury) ([place])   HPI: Ann Price is a 72 y.o. female admitted with fevers, malaise and found to have LLL infiltrate. She was seen at [facility] on 2/19 and had neg flu Ag testing, and was given cipro for possible UTI.  On admit temp was 103, wbc only 8. procalcitonin was elevated.    Past Medical History:  Diagnosis Date  . Arthritis    knees  . Diabetes mellitus without complication ([place])   . GERD (gastroesophageal reflux disease)   . Hyperlipidemia   . Hypertension   . Stroke ([place]) 2016   (TIA) no deficits  . Wears dentures    full upper, partial lower   Past Surgical History:  Procedure Laterality Date  . CATARACT EXTRACTION W/PHACO Right 03/25/2016   Procedure: CATARACT EXTRACTION PHACO AND INTRAOCULAR LENS PLACEMENT ([place])  right diabetic;  Surgeon: Eulogio Bear, MD;  Location: [place];  Service: Ophthalmology;  Laterality: Right;  diabetic - oral meds  . CATARACT EXTRACTION W/PHACO Left 06/03/2016   Procedure: CATARACT EXTRACTION PHACO AND INTRAOCULAR LENS PLACEMENT ([facility]);  Surgeon: Eulogio Bear, MD;  Location: [place];  Service: Ophthalmology;  Laterality: Left;  LEFT DIABETES - oral meds  . COLONOSCOPY    . KNEE ARTHROSCOPY Right 03/22/2015   Procedure: ARTHROSCOPY KNEE, PARTIAL MEDIAL MENISECTOMY;  Surgeon: Hessie Knows, MD;  Location: [facility];  Service: Orthopedics;  Laterality: Right;  . TUBAL LIGATION    . UPPER GI ENDOSCOPY    . VAGINAL HYSTERECTOMY     Social History   Tobacco Use  . Smoking status: Never Smoker  . Smokeless tobacco: Never Used  Substance Use Topics  . Alcohol use: No  . Drug use: No   Family History   Problem Relation Age of Onset  . Hypertension Mother   . Hyperlipidemia Mother   . Hypertension Father   . Hyperlipidemia Father   . Diabetes Father   . Diabetes Maternal Grandmother     Allergies:  Allergies  Allergen Reactions  . Aspirin Itching  . Penicillins Other (See Comments)    Stomach burns Has patient had a PCN reaction causing immediate rash, facial/tongue/throat swelling, SOB or lightheadedness with hypotension: No Has patient had a PCN reaction causing severe rash involving mucus membranes or skin necrosis: No Has patient had a PCN reaction that required hospitalization: No Has patient had a PCN reaction occurring within the last 10 years: No If all of the above answers are "NO", then may proceed with Cephalosporin use.     Current antibiotics: Antibiotics Given (last 72 hours)    Date/Time Action Medication Dose Rate   03/20/17 1715 Given   vancomycin (VANCOCIN) 50 mg/mL oral solution 125 mg 125 mg    03/20/17 1716 New Bag/Given   azithromycin (ZITHROMAX) 500 mg in sodium chloride 0.9 % 250 mL IVPB 500 mg 250 mL/hr   03/20/17 2024 Given   doxycycline (VIBRA-TABS) tablet 100 mg 100 mg    03/20/17 2214 Given   vancomycin (VANCOCIN) 50 mg/mL oral solution 125 mg 125 mg    03/21/17 0825 Given   doxycycline (VIBRA-TABS) tablet 100 mg 100 mg    03/21/17 1100  Given   vancomycin (VANCOCIN) 50 mg/mL oral solution 125 mg 125 mg    03/21/17 1417 Given   vancomycin (VANCOCIN) 50 mg/mL oral solution 125 mg 125 mg    03/21/17 1417 New Bag/Given   piperacillin-tazobactam (ZOSYN) IVPB 3.375 g 3.375 g 12.5 mL/hr   03/21/17 1746 Given   vancomycin (VANCOCIN) 50 mg/mL oral solution 125 mg 125 mg    03/21/17 2132 Given   vancomycin (VANCOCIN) 50 mg/mL oral solution 125 mg 125 mg    03/21/17 2132 New Bag/Given   piperacillin-tazobactam (ZOSYN) IVPB 3.375 g 3.375 g 12.5 mL/hr   03/22/17 0550 New Bag/Given   piperacillin-tazobactam (ZOSYN) IVPB 3.375 g 3.375 g 12.5 mL/hr    03/22/17 1443 Given   vancomycin (VANCOCIN) 50 mg/mL oral solution 125 mg 125 mg    03/22/17 1443 New Bag/Given   piperacillin-tazobactam (ZOSYN) IVPB 3.375 g 3.375 g 12.5 mL/hr   03/22/17 1815 Given   vancomycin (VANCOCIN) 50 mg/mL oral solution 125 mg 125 mg    03/22/17 2101 Given   vancomycin (VANCOCIN) 50 mg/mL oral solution 125 mg 125 mg    03/22/17 2101 New Bag/Given   piperacillin-tazobactam (ZOSYN) IVPB 3.375 g 3.375 g 12.5 mL/hr   03/23/17 0423 New Bag/Given  [Pt. prefernce]   piperacillin-tazobactam (ZOSYN) IVPB 3.375 g 3.375 g 12.5 mL/hr   03/23/17 0926 Given   vancomycin (VANCOCIN) 50 mg/mL oral solution 125 mg 125 mg    03/23/17 1305 Given   vancomycin (VANCOCIN) 50 mg/mL oral solution 125 mg 125 mg    03/23/17 1305 New Bag/Given   piperacillin-tazobactam (ZOSYN) IVPB 3.375 g 3.375 g 12.5 mL/hr      MEDICATIONS: . atenolol  50 mg Oral Daily  . atorvastatin  40 mg Oral q1800  . clopidogrel  75 mg Oral Daily  . heparin  5,000 Units Subcutaneous Q8H  . insulin aspart  0-5 Units Subcutaneous QHS  . insulin aspart  0-9 Units Subcutaneous TID WC  . sodium chloride flush  3 mL Intravenous Q12H  . vancomycin  125 mg Oral QID    Review of Systems - 11 systems reviewed and negative per HPI   OBJECTIVE: Temp:  [97.3 F (36.3 C)-102.8 F (39.3 C)] 101.4 F (38.6 C) (02/25 1309) Pulse Rate:  [64-84] 64 (02/25 1331) Resp:  [16-19] 19 (02/25 1309) BP: (115-147)/(48-92) 132/52 (02/25 1331) SpO2:  [93 %-99 %] 98 % (02/25 1309) Physical Exam  Constitutional:  Obese, lying in bed, moaning HENT: [place]/AT, PERRLA, no scleral icterus Mouth/Throat: Oropharynx is clear and dry . No oropharyngeal exudate.  Cardiovascular: Normal rate, regular rhythm and normal heart sounds. Pulmonary/Chest: poor air movement.  Neck = supple, no nuchal rigidity Abdominal: Soft. Bowel sounds are normal.  exhibits no distension. There is no tenderness.  Lymphadenopathy: no cervical adenopathy. No  axillary adenopathy Neurological: alert and oriented to person, place, and time.  Skin: Skin is warm and dry. No rash noted. No erythema.  Healed scar on RLE  Psychiatric: a normal mood and affect.  behavior is normal.   LABS: Results for orders placed or performed during the hospital encounter of 03/18/17 (from the past 48 hour(s))  Glucose, capillary     Status: Abnormal   Collection Time: 03/21/17  4:46 PM  Result Value Ref Range   Glucose-Capillary 133 (H) 65 - 99 mg/dL  Respiratory Panel by PCR     Status: Abnormal   Collection Time: 03/21/17  6:47 PM  Result Value Ref Range   Adenovirus DETECTED (  A) NOT DETECTED   Coronavirus 229E NOT DETECTED NOT DETECTED   Coronavirus HKU1 NOT DETECTED NOT DETECTED   Coronavirus NL63 NOT DETECTED NOT DETECTED   Coronavirus OC43 NOT DETECTED NOT DETECTED   Metapneumovirus NOT DETECTED NOT DETECTED   Rhinovirus / Enterovirus NOT DETECTED NOT DETECTED   Influenza A NOT DETECTED NOT DETECTED   Influenza B NOT DETECTED NOT DETECTED   Parainfluenza Virus 1 NOT DETECTED NOT DETECTED   Parainfluenza Virus 2 NOT DETECTED NOT DETECTED   Parainfluenza Virus 3 NOT DETECTED NOT DETECTED   Parainfluenza Virus 4 NOT DETECTED NOT DETECTED   Respiratory Syncytial Virus NOT DETECTED NOT DETECTED   Bordetella pertussis NOT DETECTED NOT DETECTED   Chlamydophila pneumoniae NOT DETECTED NOT DETECTED   Mycoplasma pneumoniae NOT DETECTED NOT DETECTED    Comment: Performed at [facility], [place] [address] [place], [place] [address]  MRSA PCR Screening     Status: None   Collection Time: 03/21/17  9:35 PM  Result Value Ref Range   MRSA by PCR NEGATIVE NEGATIVE    Comment:        The GeneXpert MRSA Assay (FDA approved for NASAL specimens only), is one component of a comprehensive MRSA colonization surveillance program. It is not intended to diagnose MRSA infection nor to guide or monitor treatment for MRSA infections. Performed at [facility], [address] [place], [place] [address]   Glucose, capillary     Status: Abnormal   Collection Time: 03/21/17 10:36 PM  Result Value Ref Range   Glucose-Capillary 104 (H) 65 - 99 mg/dL  Glucose, capillary     Status: Abnormal   Collection Time: 03/22/17  7:58 AM  Result Value Ref Range   Glucose-Capillary 102 (H) 65 - 99 mg/dL  Glucose, capillary     Status: None   Collection Time: 03/22/17 12:24 PM  Result Value Ref Range   Glucose-Capillary 86 65 - 99 mg/dL   Comment 1 Notify RN   Glucose, capillary     Status: Abnormal   Collection Time: 03/22/17  4:24 PM  Result Value Ref Range   Glucose-Capillary 121 (H) 65 - 99 mg/dL   Comment 1 Notify RN   Glucose, capillary     Status: None   Collection Time: 03/22/17  8:27 PM  Result Value Ref Range   Glucose-Capillary 81 65 - 99 mg/dL   Comment 1 Notify RN    Comment 2 Document in Chart   Glucose, capillary     Status: None   Collection Time: 03/23/17  7:28 AM  Result Value Ref Range   Glucose-Capillary 72 65 - 99 mg/dL  Glucose, capillary     Status: None   Collection Time: 03/23/17 11:39 AM  Result Value Ref Range   Glucose-Capillary 89 65 - 99 mg/dL   No components found for: ESR, C REACTIVE PROTEIN MICRO: Recent Results (from the past 720 hour(s))  Culture, blood (routine x 2)     Status: None   Collection Time: 03/18/17  7:55 PM  Result Value Ref Range Status   Specimen Description BLOOD RFOA  Final   Special Requests   Final    BOTTLES DRAWN AEROBIC AND ANAEROBIC Blood Culture adequate volume   Culture   Final    NO GROWTH 5 DAYS Performed at [facility], [address], [place], [place] [address]    Report Status 03/23/2017 FINAL  Final  Culture, blood (routine x 2)     Status:  None   Collection Time: 03/18/17  7:55 PM  Result Value Ref Range Status   Specimen Description BLOOD LFOA  Final   Special Requests   Final    BOTTLES DRAWN AEROBIC AND ANAEROBIC Blood Culture adequate volume    Culture   Final    NO GROWTH 5 DAYS Performed at [facility], [address], [place], [place] [address]    Report Status 03/23/2017 FINAL  Final  Urine culture     Status: None   Collection Time: 03/18/17  7:55 PM  Result Value Ref Range Status   Specimen Description   Final    URINE, RANDOM Performed at [facility], [address] [place], [place] [address]    Special Requests   Final    NONE Performed at [facility], [address] [place], [place] [address]    Culture   Final    NO GROWTH Performed at [facility], [place] [address], [place], [place] [address]    Report Status 03/20/2017 FINAL  Final  C difficile quick scan w PCR reflex     Status: Abnormal   Collection Time: 03/18/17 10:38 PM  Result Value Ref Range Status   C Diff antigen POSITIVE (A) NEGATIVE Final   C Diff toxin NEGATIVE NEGATIVE Final   C Diff interpretation Results are indeterminate. See PCR results.  Final    Comment: Performed at [facility], [address] [place], [place] [address]  Gastrointestinal Panel by PCR , Stool     Status: None   Collection Time: 03/18/17 10:38 PM  Result Value Ref Range Status   Campylobacter species NOT DETECTED NOT DETECTED Final   Plesimonas shigelloides NOT DETECTED NOT DETECTED Final   Salmonella species NOT DETECTED NOT DETECTED Final   Yersinia enterocolitica NOT DETECTED NOT DETECTED Final   Vibrio species NOT DETECTED NOT DETECTED Final   Vibrio cholerae NOT DETECTED NOT DETECTED Final   Enteroaggregative E coli (EAEC) NOT DETECTED NOT DETECTED Final   Enteropathogenic E coli (EPEC) NOT DETECTED NOT DETECTED Final   Enterotoxigenic E coli (ETEC) NOT DETECTED NOT DETECTED Final   Shiga like toxin producing E coli (STEC) NOT DETECTED NOT DETECTED Final   Shigella/Enteroinvasive E coli (EIEC) NOT DETECTED NOT DETECTED Final   Cryptosporidium NOT DETECTED NOT DETECTED Final   Cyclospora cayetanensis NOT DETECTED  NOT DETECTED Final   Entamoeba histolytica NOT DETECTED NOT DETECTED Final   Giardia lamblia NOT DETECTED NOT DETECTED Final   Adenovirus F40/41 NOT DETECTED NOT DETECTED Final   Astrovirus NOT DETECTED NOT DETECTED Final   Norovirus GI/GII NOT DETECTED NOT DETECTED Final   Rotavirus A NOT DETECTED NOT DETECTED Final   Sapovirus (I, II, IV, and V) NOT DETECTED NOT DETECTED Final    Comment: Performed at [facility], [address] [place], [place] [address]  C. Diff by PCR, Reflexed     Status: Abnormal   Collection Time: 03/18/17 10:38 PM  Result Value Ref Range Status   Toxigenic C. Difficile by PCR POSITIVE (A) NEGATIVE Final    Comment: Positive for toxigenic C. difficile with little to no toxin production. Only treat if clinical presentation suggests symptomatic illness. Performed at [facility], [address] [place], [place] [address]   Culture, blood (single) w Reflex to ID Panel     Status: None (Preliminary result)   Collection Time: 03/20/17  7:01 PM  Result Value Ref Range Status   Specimen Description  BLOOD LEFT ANTECUBITAL  Final   Special Requests   Final    BOTTLES DRAWN AEROBIC AND ANAEROBIC Blood Culture adequate volume   Culture   Final    NO GROWTH 3 DAYS Performed at [facility], [address] [place], [place] [address]    Report Status PENDING  Incomplete  Respiratory Panel by PCR     Status: Abnormal   Collection Time: 03/21/17  6:47 PM  Result Value Ref Range Status   Adenovirus DETECTED (A) NOT DETECTED Final   Coronavirus 229E NOT DETECTED NOT DETECTED Final   Coronavirus HKU1 NOT DETECTED NOT DETECTED Final   Coronavirus NL63 NOT DETECTED NOT DETECTED Final   Coronavirus OC43 NOT DETECTED NOT DETECTED Final   Metapneumovirus NOT DETECTED NOT DETECTED Final   Rhinovirus / Enterovirus NOT DETECTED NOT DETECTED Final   Influenza A NOT DETECTED NOT DETECTED Final   Influenza B NOT DETECTED NOT DETECTED Final    Parainfluenza Virus 1 NOT DETECTED NOT DETECTED Final   Parainfluenza Virus 2 NOT DETECTED NOT DETECTED Final   Parainfluenza Virus 3 NOT DETECTED NOT DETECTED Final   Parainfluenza Virus 4 NOT DETECTED NOT DETECTED Final   Respiratory Syncytial Virus NOT DETECTED NOT DETECTED Final   Bordetella pertussis NOT DETECTED NOT DETECTED Final   Chlamydophila pneumoniae NOT DETECTED NOT DETECTED Final   Mycoplasma pneumoniae NOT DETECTED NOT DETECTED Final    Comment: Performed at [facility], [place] [address], [place], [place] [address]  MRSA PCR Screening     Status: None   Collection Time: 03/21/17  9:35 PM  Result Value Ref Range Status   MRSA by PCR NEGATIVE NEGATIVE Final    Comment:        The GeneXpert MRSA Assay (FDA approved for NASAL specimens only), is one component of a comprehensive MRSA colonization surveillance program. It is not intended to diagnose MRSA infection nor to guide or monitor treatment for MRSA infections. Performed at [facility], [address] [place], [place] [address]     IMAGING: Ct Abdomen Pelvis Wo Contrast  Result Date: 03/22/2017 CLINICAL DATA:  Abdominal pain. Gastroenteritis or colitis suspected. EXAM: CT ABDOMEN WITHOUT CONTRAST TECHNIQUE: Multidetector CT imaging of the abdomen was performed following the standard protocol without IV contrast. COMPARISON:  Two-view chest x-ray 03/18/2017 FINDINGS: Lower chest: Left lower lobe consolidated pneumonia is confirmed. Ill-defined airspace disease is present in the right lower lobe as well. The heart is mildly enlarged. No significant pleural or pericardial effusion is present. Hepatobiliary: No focal liver abnormality is seen. No gallstones, gallbladder wall thickening, or biliary dilatation. Pancreas: Unremarkable. No pancreatic ductal dilatation or surrounding inflammatory changes. Spleen: Normal in size without focal abnormality. Adrenals/Urinary Tract: Adrenal glands are normal  bilaterally. Kidneys and ureters are within normal limits. Stomach/Bowel: The stomach and duodenum are within normal limits. Fluid levels are present within nondilated loops of small bowel. There is no focal obstruction. Visualized colon is within normal limits. Vascular/Lymphatic: Vascular calcifications are present in the aorta and branch vessels without aneurysm. Other: No abdominal wall hernia or abnormality. Musculoskeletal: Slight anterolisthesis at L4-5 is degenerative. There is uncovering of a broad-based disc protrusion at this level. This results in mild foraminal narrowing bilaterally. Mild foraminal narrowing is also present at L3-4. Vertebral body heights are maintained. No focal lytic or blastic lesions are present. IMPRESSION: 1. Left lower lobe consolidated pneumonia. 2. Ill-defined airspace disease in the right lower lobe likely also represents infection. 3. Fluid levels  within nondilated loops of small bowel may represent ileus. There is no focal obstruction. 4. Degenerative changes of the lumbar spine with foraminal narrowing bilaterally at L3-4 and L4-5. Electronically Signed   By: San Morelle M.D.   On: 03/22/2017 20:13   Dg Chest 2 View  Result Date: 03/18/2017 CLINICAL DATA:  72 year old female with multiple days of generalized body aches, nausea, diarrhea and fever. EXAM: CHEST  2 VIEW COMPARISON:  Chest x-ray 04/18/2014. FINDINGS: Airspace consolidation in the left lower lobe, concerning for pneumonia. Right lung is clear. No pleural effusions. Vascular crowding, without frank pulmonary edema. No pneumothorax. Heart size is borderline enlarged. Upper mediastinal contours are within normal limits. IMPRESSION: 1. Left lower lobe airspace consolidation concerning for pneumonia. Electronically Signed   By: Vinnie Langton M.D.   On: 03/18/2017 19:03    Assessment:   Ann Price is a 72 y.o. female admitted with fevers, malaise for several days and found to have LLL PNA,  with neg blood cultures, recurrent high fevers and Resp PCR + adenovirus. Flu testing neg. CT abd with only LLL consolidation. WBC nml but Procalcitonin elevated and fevers persist.  C diff test + but diarrhea improving. MRSA PCR negative.  I am concerned she may have resistant PNA so will add vanco despite the negative MRSA PCR.  Recommendations Continue zosyn Add IV vanco Cont oral vanco Will discuss with Health dept if this could be adenovirus 14 which can cause severe resp illness and PNA.  Thank you very much for allowing me to participate in the care of this patient. Please call with questions.   Cheral Marker. Ola Spurr, MD

## 2017-03-23 NOTE — Progress Notes (Signed)
Temp came down to 98.9

## 2017-03-23 NOTE — Progress Notes (Signed)
Temp=102.8, no acte distress noted. Tylenol 650 mg oral PRN administered and will be rechecked. Dr. Sheryle Hailiamond notified without any new order. Will continue to monitor.

## 2017-03-23 NOTE — Progress Notes (Signed)
Talked to Dr. Elisabeth PigeonVachhani about patient's order for urinalysis to check for strep pneumoniae and legionella Pneumophila patient is incontinent for bowel and bladder, order for straight cath once. RN will continue to monitor.

## 2017-03-23 NOTE — Progress Notes (Signed)
Pharmacy Antibiotic Note  Ann OddiBarbara L Azpeitia is a 72 y.o. female admitted on 03/18/2017 with aspiration pneumonia.  Pharmacy has been consulted for Zosyn dosing. Vancomycin added per ID.   Plan: Ke= 0.039 h-1, Vd= 47.7 L Vancomycin 1000 mg IV q 24 hours with stacked dosing and a trough with the 4th dose. Goal trough 15-20 mcg/ml.   Zosyn 3.375 gm IV Q8H EI  Height: 5\' 3"  (160 cm) Weight: 202 lb 8 oz (91.9 kg) IBW/kg (Calculated) : 52.4  Temp (24hrs), Avg:99.8 F (37.7 C), Min:97.3 F (36.3 C), Max:102.8 F (39.3 C)  Recent Labs  Lab 03/18/17 1915 03/18/17 1955 03/18/17 2153 03/19/17 0322 03/20/17 0503 03/21/17 1227  WBC 8.7  --   --  8.0 6.9 6.5  CREATININE 1.76*  --   --  1.52* 1.28*  --   LATICACIDVEN  --  1.2 1.0  --   --   --     Estimated Creatinine Clearance: 42.1 mL/min (A) (by C-G formula based on SCr of 1.28 mg/dL (H)).    Allergies  Allergen Reactions  . Aspirin Itching  . Penicillins Other (See Comments)    Stomach burns Has patient had a PCN reaction causing immediate rash, facial/tongue/throat swelling, SOB or lightheadedness with hypotension: No Has patient had a PCN reaction causing severe rash involving mucus membranes or skin necrosis: No Has patient had a PCN reaction that required hospitalization: No Has patient had a PCN reaction occurring within the last 10 years: No If all of the above answers are "NO", then may proceed with Cephalosporin use.     Antimicrobials this admission: Azithromycin/Ceftriaxone, oral doxycycline, now Zosyn Vancomycin added per ID Dose adjustments this admission:   Microbiology results: 03/20/17 BCx: NG UCx: NG Sputum: sent 03/21/17 MRSA PCR: negative  Thank you for allowing pharmacy to be a part of this patient's care.  Luisa Harthristy, Analys Ryden D, Pharm.D., BCPS Clinical Pharmacist 03/23/2017 3:46 PM

## 2017-03-24 LAB — URINALYSIS, COMPLETE (UACMP) WITH MICROSCOPIC
BACTERIA UA: NONE SEEN
BILIRUBIN URINE: NEGATIVE
GLUCOSE, UA: NEGATIVE mg/dL
Ketones, ur: NEGATIVE mg/dL
Leukocytes, UA: NEGATIVE
NITRITE: NEGATIVE
PROTEIN: 30 mg/dL — AB
Specific Gravity, Urine: 1.015 (ref 1.005–1.030)
Squamous Epithelial / LPF: NONE SEEN
pH: 5 (ref 5.0–8.0)

## 2017-03-24 LAB — GLUCOSE, CAPILLARY
GLUCOSE-CAPILLARY: 121 mg/dL — AB (ref 65–99)
GLUCOSE-CAPILLARY: 119 mg/dL — AB (ref 65–99)
Glucose-Capillary: 100 mg/dL — ABNORMAL HIGH (ref 65–99)
Glucose-Capillary: 159 mg/dL — ABNORMAL HIGH (ref 65–99)

## 2017-03-24 LAB — CREATININE, SERUM
Creatinine, Ser: 3.44 mg/dL — ABNORMAL HIGH (ref 0.44–1.00)
GFR calc Af Amer: 14 mL/min — ABNORMAL LOW (ref >60)
GFR calc non Af Amer: 12 mL/min — ABNORMAL LOW (ref >60)

## 2017-03-24 LAB — BASIC METABOLIC PANEL
Anion gap: 13 (ref 5–15)
BUN: 43 mg/dL — ABNORMAL HIGH (ref 6–20)
CO2: 17 mmol/L — AB (ref 22–32)
Calcium: 7.8 mg/dL — ABNORMAL LOW (ref 8.9–10.3)
Chloride: 104 mmol/L (ref 101–111)
Creatinine, Ser: 3.5 mg/dL — ABNORMAL HIGH (ref 0.44–1.00)
GFR calc Af Amer: 14 mL/min — ABNORMAL LOW (ref >60)
GFR calc non Af Amer: 12 mL/min — ABNORMAL LOW (ref >60)
GLUCOSE: 128 mg/dL — AB (ref 65–99)
POTASSIUM: 3.4 mmol/L — AB (ref 3.5–5.1)
Sodium: 134 mmol/L — ABNORMAL LOW (ref 135–145)

## 2017-03-24 LAB — CBC
HCT: 29.3 % — ABNORMAL LOW (ref 35.0–47.0)
Hemoglobin: 9.5 g/dL — ABNORMAL LOW (ref 12.0–16.0)
MCH: 21.5 pg — AB (ref 26.0–34.0)
MCHC: 32.4 g/dL (ref 32.0–36.0)
MCV: 66.5 fL — AB (ref 80.0–100.0)
PLATELETS: 214 10*3/uL (ref 150–440)
RBC: 4.41 MIL/uL (ref 3.80–5.20)
RDW: 19.6 % — AB (ref 11.5–14.5)
WBC: 6.4 10*3/uL (ref 3.6–11.0)

## 2017-03-24 LAB — MAGNESIUM: Magnesium: 2.4 mg/dL (ref 1.7–2.4)

## 2017-03-24 LAB — STREP PNEUMONIAE URINARY ANTIGEN: Strep Pneumo Urinary Antigen: NEGATIVE

## 2017-03-24 MED ORDER — DIPHENHYDRAMINE HCL 25 MG PO CAPS
25.0000 mg | ORAL_CAPSULE | Freq: Four times a day (QID) | ORAL | Status: DC | PRN
  Administered 2017-03-24: 25 mg via ORAL
  Filled 2017-03-24 (×2): qty 1

## 2017-03-24 MED ORDER — PIPERACILLIN-TAZOBACTAM 3.375 G IVPB
3.3750 g | Freq: Two times a day (BID) | INTRAVENOUS | Status: DC
  Administered 2017-03-24 – 2017-03-25 (×2): 3.375 g via INTRAVENOUS
  Filled 2017-03-24 (×2): qty 50

## 2017-03-24 MED ORDER — SODIUM CHLORIDE 0.9 % IV SOLN
INTRAVENOUS | Status: AC
  Administered 2017-03-24 – 2017-03-27 (×3): via INTRAVENOUS

## 2017-03-24 MED ORDER — POTASSIUM CHLORIDE CRYS ER 20 MEQ PO TBCR
40.0000 meq | EXTENDED_RELEASE_TABLET | Freq: Once | ORAL | Status: AC
  Administered 2017-03-24: 40 meq via ORAL
  Filled 2017-03-24: qty 2

## 2017-03-24 NOTE — Progress Notes (Signed)
[facility] CLINIC INFECTIOUS DISEASE PROGRESS NOTE Date of Admission:  03/18/2017     ID: Ann Price is a 72 y.o. female with fever, PNA Principal Problem:   Sepsis (HCC) Active Problems:   CAP (community acquired pneumonia)   Diabetes (HCC)   HTN (hypertension)   HLD (hyperlipidemia)   AKI (acute kidney injury) (HCC)   Subjective: Fevers finally improving.  ROS  Eleven systems are reviewed and negative except per hpi  Medications:  Antibiotics Given (last 72 hours)    Date/Time Action Medication Dose Rate   03/21/17 1746 Given   vancomycin (VANCOCIN) 50 mg/mL oral solution 125 mg 125 mg    03/21/17 2132 Given   vancomycin (VANCOCIN) 50 mg/mL oral solution 125 mg 125 mg    03/21/17 2132 New Bag/Given   piperacillin-tazobactam (ZOSYN) IVPB 3.375 g 3.375 g 12.5 mL/hr   03/22/17 0550 New Bag/Given   piperacillin-tazobactam (ZOSYN) IVPB 3.375 g 3.375 g 12.5 mL/hr   03/22/17 1443 Given   vancomycin (VANCOCIN) 50 mg/mL oral solution 125 mg 125 mg    03/22/17 1443 New Bag/Given   piperacillin-tazobactam (ZOSYN) IVPB 3.375 g 3.375 g 12.5 mL/hr   03/22/17 1815 Given   vancomycin (VANCOCIN) 50 mg/mL oral solution 125 mg 125 mg    03/22/17 2101 Given   vancomycin (VANCOCIN) 50 mg/mL oral solution 125 mg 125 mg    03/22/17 2101 New Bag/Given   piperacillin-tazobactam (ZOSYN) IVPB 3.375 g 3.375 g 12.5 mL/hr   03/23/17 0423 New Bag/Given  [Pt. prefernce]   piperacillin-tazobactam (ZOSYN) IVPB 3.375 g 3.375 g 12.5 mL/hr   03/23/17 0926 Given   vancomycin (VANCOCIN) 50 mg/mL oral solution 125 mg 125 mg    03/23/17 1305 Given   vancomycin (VANCOCIN) 50 mg/mL oral solution 125 mg 125 mg    03/23/17 1305 New Bag/Given   piperacillin-tazobactam (ZOSYN) IVPB 3.375 g 3.375 g 12.5 mL/hr   03/23/17 1650 New Bag/Given   vancomycin (VANCOCIN) IVPB 1000 mg/200 mL premix 1,000 mg 200 mL/hr   03/23/17 1915 Given   vancomycin (VANCOCIN) 50 mg/mL oral solution 125 mg 125 mg    03/23/17 2304  Given   vancomycin (VANCOCIN) 50 mg/mL oral solution 125 mg 125 mg    03/23/17 2309 New Bag/Given   piperacillin-tazobactam (ZOSYN) IVPB 3.375 g 3.375 g 12.5 mL/hr   03/23/17 2309 New Bag/Given   vancomycin (VANCOCIN) IVPB 1000 mg/200 mL premix 1,000 mg 200 mL/hr   03/24/17 0501 New Bag/Given   piperacillin-tazobactam (ZOSYN) IVPB 3.375 g 3.375 g 12.5 mL/hr   03/24/17 0935 Given   vancomycin (VANCOCIN) 50 mg/mL oral solution 125 mg 125 mg    03/24/17 1306 Given   vancomycin (VANCOCIN) 50 mg/mL oral solution 125 mg 125 mg      . atenolol  50 mg Oral Daily  . atorvastatin  40 mg Oral q1800  . clopidogrel  75 mg Oral Daily  . heparin  5,000 Units Subcutaneous Q8H  . insulin aspart  0-5 Units Subcutaneous QHS  . insulin aspart  0-9 Units Subcutaneous TID WC  . potassium chloride  40 mEq Oral Once  . sodium chloride flush  3 mL Intravenous Q12H  . vancomycin  125 mg Oral QID    Objective: Vital signs in last 24 hours: Temp:  [97.6 F (36.4 C)-99.5 F (37.5 C)] 99 F (37.2 C) (02/26 1302) Pulse Rate:  [58-70] 58 (02/26 1304) Resp:  [19-22] 20 (02/26 0806) BP: (110-139)/(44-98) 116/60 (02/26 1304) SpO2:  [91 %-99 %] 94 % (  02/26 1304) Constitutional:  Obese, lying in bed, moaning HENT: [place]/AT, PERRLA, no scleral icterus Mouth/Throat: Oropharynx is clear and dry . No oropharyngeal exudate.  Cardiovascular: Normal rate, regular rhythm and normal heart sounds. Pulmonary/Chest: poor air movement.  Neck = supple, no nuchal rigidity Abdominal: Soft. Bowel sounds are normal.  exhibits no distension. There is no tenderness.  Lymphadenopathy: no cervical adenopathy. No axillary adenopathy Neurological: alert and oriented to person, place, and time.  Skin: Skin is warm and dry. No rash noted. No erythema.  Healed scar on RLE  Psychiatric: a normal mood and affect.  behavior is normal  Lab Results Recent Labs    03/24/17 0510 03/24/17 1125  WBC 6.4  --   HGB 9.5*  --   HCT 29.3*  --    NA  --  134*  K  --  3.4*  CL  --  104  CO2  --  17*  BUN  --  43*  CREATININE 3.44* 3.50*    Microbiology: Results for orders placed or performed during the hospital encounter of 03/18/17  Culture, blood (routine x 2)     Status: None   Collection Time: 03/18/17  7:55 PM  Result Value Ref Range Status   Specimen Description BLOOD RFOA  Final   Special Requests   Final    BOTTLES DRAWN AEROBIC AND ANAEROBIC Blood Culture adequate volume   Culture   Final    NO GROWTH 5 DAYS Performed at [facility], [address] [place], [place] [address]    Report Status 03/23/2017 FINAL  Final  Culture, blood (routine x 2)     Status: None   Collection Time: 03/18/17  7:55 PM  Result Value Ref Range Status   Specimen Description BLOOD LFOA  Final   Special Requests   Final    BOTTLES DRAWN AEROBIC AND ANAEROBIC Blood Culture adequate volume   Culture   Final    NO GROWTH 5 DAYS Performed at [facility], [address] [place], [place] [address]    Report Status 03/23/2017 FINAL  Final  Urine culture     Status: None   Collection Time: 03/18/17  7:55 PM  Result Value Ref Range Status   Specimen Description   Final    URINE, RANDOM Performed at [facility], [address] [place], [place] [address]    Special Requests   Final    NONE Performed at [facility], [address] [place], [place] [address]    Culture   Final    NO GROWTH Performed at [facility], [address] [place], [place] [address]    Report Status 03/20/2017 FINAL  Final  C difficile quick scan w PCR reflex     Status: Abnormal   Collection Time: 03/18/17 10:38 PM  Result Value Ref Range Status   C Diff antigen POSITIVE (A) NEGATIVE Final   C Diff toxin NEGATIVE NEGATIVE Final   C Diff interpretation Results are indeterminate. See PCR results.  Final    Comment: Performed at [facility], [address] [place], [place] [address]   Gastrointestinal Panel by PCR , Stool     Status: None   Collection Time: 03/18/17 10:38 PM  Result Value Ref Range Status   Campylobacter species NOT DETECTED NOT DETECTED Final   Plesimonas shigelloides NOT DETECTED NOT DETECTED Final   Salmonella species NOT DETECTED NOT DETECTED Final   Yersinia enterocolitica NOT DETECTED NOT DETECTED  Final   Vibrio species NOT DETECTED NOT DETECTED Final   Vibrio cholerae NOT DETECTED NOT DETECTED Final   Enteroaggregative E coli (EAEC) NOT DETECTED NOT DETECTED Final   Enteropathogenic E coli (EPEC) NOT DETECTED NOT DETECTED Final   Enterotoxigenic E coli (ETEC) NOT DETECTED NOT DETECTED Final   Shiga like toxin producing E coli (STEC) NOT DETECTED NOT DETECTED Final   Shigella/Enteroinvasive E coli (EIEC) NOT DETECTED NOT DETECTED Final   Cryptosporidium NOT DETECTED NOT DETECTED Final   Cyclospora cayetanensis NOT DETECTED NOT DETECTED Final   Entamoeba histolytica NOT DETECTED NOT DETECTED Final   Giardia lamblia NOT DETECTED NOT DETECTED Final   Adenovirus F40/41 NOT DETECTED NOT DETECTED Final   Astrovirus NOT DETECTED NOT DETECTED Final   Norovirus GI/GII NOT DETECTED NOT DETECTED Final   Rotavirus A NOT DETECTED NOT DETECTED Final   Sapovirus (I, II, IV, and V) NOT DETECTED NOT DETECTED Final    Comment: Performed at [facility], [address] [place], [place] [address]  C. Diff by PCR, Reflexed     Status: Abnormal   Collection Time: 03/18/17 10:38 PM  Result Value Ref Range Status   Toxigenic C. Difficile by PCR POSITIVE (A) NEGATIVE Final    Comment: Positive for toxigenic C. difficile with little to no toxin production. Only treat if clinical presentation suggests symptomatic illness. Performed at [facility], [address] [place], [place] [address]   Culture, blood (single) w Reflex to ID Panel     Status: None (Preliminary result)   Collection Time: 03/20/17  7:01 PM  Result Value Ref Range Status    Specimen Description BLOOD LEFT ANTECUBITAL  Final   Special Requests   Final    BOTTLES DRAWN AEROBIC AND ANAEROBIC Blood Culture adequate volume   Culture   Final    NO GROWTH 4 DAYS Performed at [facility], [address] [place], [place] [address]    Report Status PENDING  Incomplete  Respiratory Panel by PCR     Status: Abnormal   Collection Time: 03/21/17  6:47 PM  Result Value Ref Range Status   Adenovirus DETECTED (A) NOT DETECTED Final   Coronavirus 229E NOT DETECTED NOT DETECTED Final   Coronavirus HKU1 NOT DETECTED NOT DETECTED Final   Coronavirus NL63 NOT DETECTED NOT DETECTED Final   Coronavirus OC43 NOT DETECTED NOT DETECTED Final   Metapneumovirus NOT DETECTED NOT DETECTED Final   Rhinovirus / Enterovirus NOT DETECTED NOT DETECTED Final   Influenza A NOT DETECTED NOT DETECTED Final   Influenza B NOT DETECTED NOT DETECTED Final   Parainfluenza Virus 1 NOT DETECTED NOT DETECTED Final   Parainfluenza Virus 2 NOT DETECTED NOT DETECTED Final   Parainfluenza Virus 3 NOT DETECTED NOT DETECTED Final   Parainfluenza Virus 4 NOT DETECTED NOT DETECTED Final   Respiratory Syncytial Virus NOT DETECTED NOT DETECTED Final   Bordetella pertussis NOT DETECTED NOT DETECTED Final   Chlamydophila pneumoniae NOT DETECTED NOT DETECTED Final   Mycoplasma pneumoniae NOT DETECTED NOT DETECTED Final    Comment: Performed at [facility], [address] [place], [place] [address]  MRSA PCR Screening     Status: None   Collection Time: 03/21/17  9:35 PM  Result Value Ref Range Status   MRSA by PCR NEGATIVE NEGATIVE Final    Comment:        The GeneXpert MRSA Assay (FDA approved for NASAL specimens only), is one component of a comprehensive MRSA  colonization surveillance program. It is not intended to diagnose MRSA infection nor to guide or monitor treatment for MRSA infections. Performed at [facility], [address], [place], [place] [address]      Studies/Results: Ct Abdomen Pelvis Wo Contrast  Result Date: 03/22/2017 CLINICAL DATA:  Abdominal pain. Gastroenteritis or colitis suspected. EXAM: CT ABDOMEN WITHOUT CONTRAST TECHNIQUE: Multidetector CT imaging of the abdomen was performed following the standard protocol without IV contrast. COMPARISON:  Two-view chest x-ray 03/18/2017 FINDINGS: Lower chest: Left lower lobe consolidated pneumonia is confirmed. Ill-defined airspace disease is present in the right lower lobe as well. The heart is mildly enlarged. No significant pleural or pericardial effusion is present. Hepatobiliary: No focal liver abnormality is seen. No gallstones, gallbladder wall thickening, or biliary dilatation. Pancreas: Unremarkable. No pancreatic ductal dilatation or surrounding inflammatory changes. Spleen: Normal in size without focal abnormality. Adrenals/Urinary Tract: Adrenal glands are normal bilaterally. Kidneys and ureters are within normal limits. Stomach/Bowel: The stomach and duodenum are within normal limits. Fluid levels are present within nondilated loops of small bowel. There is no focal obstruction. Visualized colon is within normal limits. Vascular/Lymphatic: Vascular calcifications are present in the aorta and branch vessels without aneurysm. Other: No abdominal wall hernia or abnormality. Musculoskeletal: Slight anterolisthesis at L4-5 is degenerative. There is uncovering of a broad-based disc protrusion at this level. This results in mild foraminal narrowing bilaterally. Mild foraminal narrowing is also present at L3-4. Vertebral body heights are maintained. No focal lytic or blastic lesions are present. IMPRESSION: 1. Left lower lobe consolidated pneumonia. 2. Ill-defined airspace disease in the right lower lobe likely also represents infection. 3. Fluid levels within nondilated loops of small bowel may represent ileus. There is no focal obstruction. 4. Degenerative changes of the lumbar spine with foraminal  narrowing bilaterally at L3-4 and L4-5. Electronically Signed   By: Marin Roberts M.D.   On: 03/22/2017 20:13    Assessment/Plan: Ann Price is a 72 y.o. female admitted with fevers, malaise for several days and found to have LLL PNA, with neg blood cultures, recurrent high fevers and Resp PCR + adenovirus. Flu testing neg. CT abd with only LLL consolidation. WBC nml but Procalcitonin elevated and fevers persist.  C diff test + but diarrhea improving. MRSA PCR negative.  I2/26 - worsening renal fxn but fevers improving.   Recommendations Continue zosyn DC IV vanco with ARF Cont oral vanco Cont treatment and eval for ARF  Ordered sputum cx  Thank you very much for the consult. Will follow with you.  Mick Sell   03/24/2017, 4:35 PM

## 2017-03-24 NOTE — Consult Note (Signed)
[facility]  CONSULT NOTE    Date: 03/24/2017                  Patient Name:  Ann Price  MRN: [id number]  DOB: 08/14/1945  Age / Sex: 72 y.o., female         PCP: Leotis Shames, MD                 Service Requesting Consult: Dr. Imogene Burn                 Reason for Consult: Acute Renal Failure            History of Present Illness: Ms. Ann Price is a 72 y.o. black female with CVA, hypertension, hyperlipidemia, diabetes mellitus type II, who was admitted to [facility] on 03/18/2017 for Community acquired pneumonia, unspecified laterality [J18.9]  Patient with fever at 101.4. Creatinine at 3.5 from 3.44. Baseline of 1.76 on admission.   Patient started on NS at 177mL/hr  Found to have C. Diff colitis   Medications: Outpatient medications: Medications Prior to Admission  Medication Sig Dispense Refill Last Dose  . amLODipine (NORVASC) 10 MG tablet Take 10 mg by mouth daily.   03/17/2017 at AM  . atenolol (TENORMIN) 50 MG tablet Take 50 mg by mouth daily.   03/17/2017 at AM  . atorvastatin (LIPITOR) 40 MG tablet Take 1 tablet (40 mg total) by mouth daily at 6 PM. 30 tablet 2 03/17/2017 at Unknown time  . clopidogrel (PLAVIX) 75 MG tablet Take 1 tablet (75 mg total) by mouth daily. 30 tablet 2 03/17/2017 at AM  . hydrALAZINE (APRESOLINE) 25 MG tablet Take 25 mg by mouth 3 (three) times daily.    03/17/2017 at PM  . hydrochlorothiazide (HYDRODIURIL) 25 MG tablet Take 25 mg by mouth daily.   03/17/2017 at AM  . Insulin Glargine (LANTUS SOLOSTAR) 100 UNIT/ML Solostar Pen Inject 36 Units into the skin at bedtime.   03/17/2017 at PM  . losartan (COZAAR) 100 MG tablet Take 100 mg by mouth daily.   03/17/2017 at N/A  . metFORMIN (GLUCOPHAGE) 1000 MG tablet Take 1 tablet (1,000 mg total) by mouth 2 (two) times daily with a meal. 60 tablet 2 03/17/2017 at PM    Current medications: Current Facility-Administered Medications  Medication Dose Route Frequency Provider Last  Rate Last Dose  . 0.9 %  sodium chloride infusion   Intravenous Continuous Shaune Pollack, MD 100 mL/hr at 03/24/17 1245    . acetaminophen (TYLENOL) tablet 650 mg  650 mg Oral Q6H PRN Oralia Manis, MD   650 mg at 03/23/17 1305   Or  . acetaminophen (TYLENOL) suppository 650 mg  650 mg Rectal Q6H PRN Oralia Manis, MD   650 mg at 03/19/17 0457  . atenolol (TENORMIN) tablet 50 mg  50 mg Oral Daily Shaune Pollack, MD   50 mg at 03/24/17 0935  . atorvastatin (LIPITOR) tablet 40 mg  40 mg Oral q1800 Oralia Manis, MD   40 mg at 03/24/17 1741  . clopidogrel (PLAVIX) tablet 75 mg  75 mg Oral Daily Oralia Manis, MD   75 mg at 03/24/17 0936  . diphenhydrAMINE (BENADRYL) capsule 25 mg  25 mg Oral Q6H PRN Shaune Pollack, MD   25 mg at 03/24/17 1306  . guaiFENesin-dextromethorphan (ROBITUSSIN DM) 100-10 MG/5ML syrup 5 mL  5 mL Oral Q4H PRN Oralia Manis, MD   5 mL at 03/21/17 1004  . heparin injection 5,000  Units  5,000 Units Subcutaneous Willow Ora, MD   5,000 Units at 03/24/17 1306  . insulin aspart (novoLOG) injection 0-5 Units  0-5 Units Subcutaneous QHS Oralia Manis, MD      . insulin aspart (novoLOG) injection 0-9 Units  0-9 Units Subcutaneous TID WC Oralia Manis, MD   2 Units at 03/24/17 1742  . ipratropium-albuterol (DUONEB) 0.5-2.5 (3) MG/3ML nebulizer solution 3 mL  3 mL Nebulization Q4H PRN Oralia Manis, MD      . morphine 4 MG/ML injection 4 mg  4 mg Intravenous Q4H PRN Shaune Pollack, MD   4 mg at 03/24/17 0941  . ondansetron (ZOFRAN) tablet 4 mg  4 mg Oral Q6H PRN Oralia Manis, MD       Or  . ondansetron [facility]) injection 4 mg  4 mg Intravenous Q6H PRN Oralia Manis, MD   4 mg at 03/23/17 1623  . oxyCODONE-acetaminophen (PERCOCET/ROXICET) 5-325 MG per tablet 1 tablet  1 tablet Oral Q4H PRN Shaune Pollack, MD   1 tablet at 03/24/17 1741  . piperacillin-tazobactam (ZOSYN) IVPB 3.375 g  3.375 g Intravenous Q12H Shaune Pollack, MD 12.5 mL/hr at 03/24/17 1700 3.375 g at 03/24/17 1700  . sodium chloride flush  (NS) 0.9 % injection 3 mL  3 mL Intravenous Q12H Oralia Manis, MD   3 mL at 03/24/17 0940  . temazepam (RESTORIL) capsule 7.5 mg  7.5 mg Oral QHS PRN Ihor Austin, MD   7.5 mg at 03/23/17 2304      Allergies: Allergies  Allergen Reactions  . Aspirin Itching  . Penicillins Other (See Comments)    Stomach burns Has patient had a PCN reaction causing immediate rash, facial/tongue/throat swelling, SOB or lightheadedness with hypotension: No Has patient had a PCN reaction causing severe rash involving mucus membranes or skin necrosis: No Has patient had a PCN reaction that required hospitalization: No Has patient had a PCN reaction occurring within the last 10 years: No If all of the above answers are "NO", then may proceed with Cephalosporin use.       Past Medical History: Past Medical History:  Diagnosis Date  . Arthritis    knees  . Diabetes mellitus without complication (HCC)   . GERD (gastroesophageal reflux disease)   . Hyperlipidemia   . Hypertension   . Stroke (HCC) 2016   (TIA) no deficits  . Wears dentures    full upper, partial lower     Past Surgical History: Past Surgical History:  Procedure Laterality Date  . CATARACT EXTRACTION W/PHACO Right 03/25/2016   Procedure: CATARACT EXTRACTION PHACO AND INTRAOCULAR LENS PLACEMENT (IOC)  right diabetic;  Surgeon: Nevada Crane, MD;  Location: [facility];  Service: Ophthalmology;  Laterality: Right;  diabetic - oral meds  . CATARACT EXTRACTION W/PHACO Left 06/03/2016   Procedure: CATARACT EXTRACTION PHACO AND INTRAOCULAR LENS PLACEMENT (IOC);  Surgeon: Nevada Crane, MD;  Location: [facility];  Service: Ophthalmology;  Laterality: Left;  LEFT DIABETES - oral meds  . COLONOSCOPY    . KNEE ARTHROSCOPY Right 03/22/2015   Procedure: ARTHROSCOPY KNEE, PARTIAL MEDIAL MENISECTOMY;  Surgeon: Kennedy Bucker, MD;  Location: [facility];  Service: Orthopedics;  Laterality: Right;  . TUBAL LIGATION    . UPPER  GI ENDOSCOPY    . VAGINAL HYSTERECTOMY       Family History: Family History  Problem Relation Age of Onset  . Hypertension Mother   . Hyperlipidemia Mother   . Hypertension Father   . Hyperlipidemia  Father   . Diabetes Father   . Diabetes Maternal Grandmother      Social History: Social History   Socioeconomic History  . Marital status: Married    Spouse name: Not on file  . Number of children: Not on file  . Years of education: Not on file  . Highest education level: Not on file  Social Needs  . Financial resource strain: Not on file  . Food insecurity - worry: Not on file  . Food insecurity - inability: Not on file  . Transportation needs - medical: Not on file  . Transportation needs - non-medical: Not on file  Occupational History  . Not on file  Tobacco Use  . Smoking status: Never Smoker  . Smokeless tobacco: Never Used  Substance and Sexual Activity  . Alcohol use: No  . Drug use: No  . Sexual activity: Not on file  Other Topics Concern  . Not on file  Social History Narrative  . Not on file     Review of Systems: Review of Systems  Unable to perform ROS: Medical condition    Vital Signs: Blood pressure 109/63, pulse (!) 57, temperature 98.6 F (37 C), temperature source Oral, resp. rate 16, height 5\' 3"  (1.6 m), weight 91.9 kg (202 lb 8 oz), SpO2 90 %.  Weight trends: Filed Weights   03/20/17 0259 03/21/17 0309 03/22/17 0332  Weight: 91.9 kg (202 lb 11.2 oz) 92.7 kg (204 lb 6.4 oz) 91.9 kg (202 lb 8 oz)    Physical Exam: General: Ill appearing.   Head: Normocephalic, atraumatic. Moist oral mucosal membranes  Eyes: Anicteric, PERRL  Neck: Supple, trachea midline  Lungs:  Rhonchi on left  Heart: Regular rate and rhythm  Abdomen:  Soft, nontender, obese  Extremities: No peripheral edema.  Neurologic: Nonfocal, moving all four extremities  Skin: No lesions        Lab results: Basic Metabolic Panel: Recent Labs  Lab 03/19/17 0322  03/20/17 0503 03/24/17 0510 03/24/17 1125  NA 137 137  --  134*  K 3.8 3.6  --  3.4*  CL 105 107  --  104  CO2 22 19*  --  17*  GLUCOSE 149* 90  --  128*  BUN 20 19  --  43*  CREATININE 1.52* 1.28* 3.44* 3.50*  CALCIUM 8.2* 8.1*  --  7.8*  MG  --   --   --  2.4    Liver Function Tests: Recent Labs  Lab 03/18/17 1915  AST 54*  ALT 26  ALKPHOS 65  BILITOT 0.7  PROT 8.1  ALBUMIN 3.7   No results for input(s): LIPASE, AMYLASE in the last 168 hours. No results for input(s): AMMONIA in the last 168 hours.  CBC: Recent Labs  Lab 03/18/17 1915 03/19/17 0322 03/20/17 0503 03/21/17 1227 03/24/17 0510  WBC 8.7 8.0 6.9 6.5 6.4  NEUTROABS 7.7*  --   --   --   --   HGB 11.4* 11.5* 11.0* 9.8* 9.5*  HCT 35.2 36.3 34.7* 31.1* 29.3*  MCV 66.9* 68.6* 68.6* 66.9* 66.5*  PLT 187 187 149* 156 214    Cardiac Enzymes: No results for input(s): CKTOTAL, CKMB, CKMBINDEX, TROPONINI in the last 168 hours.  BNP: Invalid input(s): POCBNP  CBG: Recent Labs  Lab 03/23/17 1704 03/23/17 2144 03/24/17 0811 03/24/17 1220 03/24/17 1708  GLUCAP 116* 126* 100* 121* 159*    Microbiology: Results for orders placed or performed during the hospital encounter of 03/18/17  Culture, blood (routine x 2)     Status: None   Collection Time: 03/18/17  7:55 PM  Result Value Ref Range Status   Specimen Description BLOOD RFOA  Final   Special Requests   Final    BOTTLES DRAWN AEROBIC AND ANAEROBIC Blood Culture adequate volume   Culture   Final    NO GROWTH 5 DAYS Performed at [facility], [address] [place], [place] [address]    Report Status 03/23/2017 FINAL  Final  Culture, blood (routine x 2)     Status: None   Collection Time: 03/18/17  7:55 PM  Result Value Ref Range Status   Specimen Description BLOOD LFOA  Final   Special Requests   Final    BOTTLES DRAWN AEROBIC AND ANAEROBIC Blood Culture adequate volume   Culture   Final    NO GROWTH 5 DAYS Performed at  [facility], [address] [place], [place] [address]    Report Status 03/23/2017 FINAL  Final  Urine culture     Status: None   Collection Time: 03/18/17  7:55 PM  Result Value Ref Range Status   Specimen Description   Final    URINE, RANDOM Performed at [facility], [address] [place], [place] [address]    Special Requests   Final    NONE Performed at [facility], [address] [place], [place] [address]    Culture   Final    NO GROWTH Performed at [facility], [address] [place], [place] [address]    Report Status 03/20/2017 FINAL  Final  C difficile quick scan w PCR reflex     Status: Abnormal   Collection Time: 03/18/17 10:38 PM  Result Value Ref Range Status   C Diff antigen POSITIVE (A) NEGATIVE Final   C Diff toxin NEGATIVE NEGATIVE Final   C Diff interpretation Results are indeterminate. See PCR results.  Final    Comment: Performed at [facility], [address] [place], [place] [address]  Gastrointestinal Panel by PCR , Stool     Status: None   Collection Time: 03/18/17 10:38 PM  Result Value Ref Range Status   Campylobacter species NOT DETECTED NOT DETECTED Final   Plesimonas shigelloides NOT DETECTED NOT DETECTED Final   Salmonella species NOT DETECTED NOT DETECTED Final   Yersinia enterocolitica NOT DETECTED NOT DETECTED Final   Vibrio species NOT DETECTED NOT DETECTED Final   Vibrio cholerae NOT DETECTED NOT DETECTED Final   Enteroaggregative E coli (EAEC) NOT DETECTED NOT DETECTED Final   Enteropathogenic E coli (EPEC) NOT DETECTED NOT DETECTED Final   Enterotoxigenic E coli (ETEC) NOT DETECTED NOT DETECTED Final   Shiga like toxin producing E coli (STEC) NOT DETECTED NOT DETECTED Final   Shigella/Enteroinvasive E coli (EIEC) NOT DETECTED NOT DETECTED Final   Cryptosporidium NOT DETECTED NOT DETECTED Final   Cyclospora cayetanensis NOT DETECTED NOT DETECTED Final   Entamoeba histolytica NOT  DETECTED NOT DETECTED Final   Giardia lamblia NOT DETECTED NOT DETECTED Final   Adenovirus F40/41 NOT DETECTED NOT DETECTED Final   Astrovirus NOT DETECTED NOT DETECTED Final   Norovirus GI/GII NOT DETECTED NOT DETECTED Final   Rotavirus A NOT DETECTED NOT DETECTED Final   Sapovirus (I, II, IV, and V) NOT DETECTED NOT DETECTED Final    Comment: Performed at [facility], [address] [place], [place] [address]  C. Diff by PCR, Reflexed  Status: Abnormal   Collection Time: 03/18/17 10:38 PM  Result Value Ref Range Status   Toxigenic C. Difficile by PCR POSITIVE (A) NEGATIVE Final    Comment: Positive for toxigenic C. difficile with little to no toxin production. Only treat if clinical presentation suggests symptomatic illness. Performed at [facility], [address] [place], [place] [address]   Culture, blood (single) w Reflex to ID Panel     Status: None (Preliminary result)   Collection Time: 03/20/17  7:01 PM  Result Value Ref Range Status   Specimen Description BLOOD LEFT ANTECUBITAL  Final   Special Requests   Final    BOTTLES DRAWN AEROBIC AND ANAEROBIC Blood Culture adequate volume   Culture   Final    NO GROWTH 4 DAYS Performed at [facility], [address] [place], [place] [address]    Report Status PENDING  Incomplete  Respiratory Panel by PCR     Status: Abnormal   Collection Time: 03/21/17  6:47 PM  Result Value Ref Range Status   Adenovirus DETECTED (A) NOT DETECTED Final   Coronavirus 229E NOT DETECTED NOT DETECTED Final   Coronavirus HKU1 NOT DETECTED NOT DETECTED Final   Coronavirus NL63 NOT DETECTED NOT DETECTED Final   Coronavirus OC43 NOT DETECTED NOT DETECTED Final   Metapneumovirus NOT DETECTED NOT DETECTED Final   Rhinovirus / Enterovirus NOT DETECTED NOT DETECTED Final   Influenza A NOT DETECTED NOT DETECTED Final   Influenza B NOT DETECTED NOT DETECTED Final   Parainfluenza Virus 1 NOT DETECTED NOT DETECTED Final    Parainfluenza Virus 2 NOT DETECTED NOT DETECTED Final   Parainfluenza Virus 3 NOT DETECTED NOT DETECTED Final   Parainfluenza Virus 4 NOT DETECTED NOT DETECTED Final   Respiratory Syncytial Virus NOT DETECTED NOT DETECTED Final   Bordetella pertussis NOT DETECTED NOT DETECTED Final   Chlamydophila pneumoniae NOT DETECTED NOT DETECTED Final   Mycoplasma pneumoniae NOT DETECTED NOT DETECTED Final    Comment: Performed at [facility], [address], [place], [place] [address]  MRSA PCR Screening     Status: None   Collection Time: 03/21/17  9:35 PM  Result Value Ref Range Status   MRSA by PCR NEGATIVE NEGATIVE Final    Comment:        The GeneXpert MRSA Assay (FDA approved for NASAL specimens only), is one component of a comprehensive MRSA colonization surveillance program. It is not intended to diagnose MRSA infection nor to guide or monitor treatment for MRSA infections. Performed at [facility], [address] [place], [place] [address]     Coagulation Studies: No results for input(s): LABPROT, INR in the last 72 hours.  Urinalysis: Recent Labs    03/24/17 1745  COLORURINE YELLOW*  LABSPEC 1.015  PHURINE 5.0  GLUCOSEU NEGATIVE  HGBUR MODERATE*  BILIRUBINUR NEGATIVE  KETONESUR NEGATIVE  PROTEINUR 30*  NITRITE NEGATIVE  LEUKOCYTESUR NEGATIVE      Imaging:  No results found.   Assessment & Plan: Ms. AMAURA AUTHIER is a 72 y.o. black female with CVA, hypertension, hyperlipidemia, diabetes mellitus type II, who was admitted to [facility] on 03/18/2017 for Community acquired pneumonia, unspecified laterality [J18.9]  1. Acute renal failure with hyponatremia, metabolic acidosis and hypokalemia on chronic kidney disease stage III with hematuria and proteinuria: baseline creatinine of 1.3, GFR of 49 on 03/17/17 Chronic kidney disease secondary to diabetes and hypertension Acute renal failure seems to be prerenal azotemia from poor PO intake, colitis  and  pneumonia - Continue IV NS - holding hydrochlorothiazide and losartan.   2. Hypertension: hypotensive recently. Holding hydralazine - Continue atenolol  3. Diabetes mellitus type II with chronic kidney disease: insulin dependent. On metformin - Continue glucose control.      LOS: 6 Ambri Miltner 2/26/20198:27 PM

## 2017-03-24 NOTE — Progress Notes (Addendum)
[facility] [place] [facility]   PATIENT NAME: Ann Price    MR#:  [id number]  DATE OF BIRTH:  05/01/1945  SUBJECTIVE:  CHIEF COMPLAINT:   Chief Complaint  Patient presents with  . Fever  . Generalized Body Aches   The patient has no high fever since last night, still cough with sputum, generalized weakness, abdominal pain and loose diarrhea x3 times today. Still poor oral intake. On O2 [place] 3 L. REVIEW OF SYSTEMS:  Review of Systems  Constitutional: Positive for fever and malaise/fatigue. Negative for chills.  HENT: Negative for sore throat.   Eyes: Negative for blurred vision and double vision.  Respiratory: Positive for cough and sputum production. Negative for hemoptysis, shortness of breath, wheezing and stridor.   Cardiovascular: Negative for chest pain, palpitations, orthopnea and leg swelling.  Gastrointestinal: Positive for abdominal pain and diarrhea. Negative for blood in stool, melena, nausea and vomiting.  Genitourinary: Negative for dysuria, flank pain and hematuria.  Musculoskeletal: Negative for back pain and joint pain.  Skin: Negative for rash.  Neurological: Positive for weakness. Negative for dizziness, sensory change, focal weakness, seizures, loss of consciousness and headaches.  Endo/Heme/Allergies: Negative for polydipsia.  Psychiatric/Behavioral: Negative for depression. The patient is not nervous/anxious.     DRUG ALLERGIES:   Allergies  Allergen Reactions  . Aspirin Itching  . Penicillins Other (See Comments)    Stomach burns Has patient had a PCN reaction causing immediate rash, facial/tongue/throat swelling, SOB or lightheadedness with hypotension: No Has patient had a PCN reaction causing severe rash involving mucus membranes or skin necrosis: No Has patient had a PCN reaction that required hospitalization: No Has patient had a PCN reaction occurring within the last 10 years: No If all of the above answers are "NO",  then may proceed with Cephalosporin use.    VITALS:  Blood pressure 116/60, pulse (!) 58, temperature 99 F (37.2 C), temperature source Oral, resp. rate 20, height 5\' 3"  (1.6 m), weight 202 lb 8 oz (91.9 kg), SpO2 94 %. PHYSICAL EXAMINATION:  Physical Exam  Constitutional: She is oriented to person, place, and time and well-developed, well-nourished, and in no distress.  Obesity.  HENT:  Head: Normocephalic.  Mouth/Throat: Oropharynx is clear and moist.  Eyes: Conjunctivae and EOM are normal. Pupils are equal, round, and reactive to light. No scleral icterus.  Neck: Normal range of motion. Neck supple. No JVD present. No tracheal deviation present.  Cardiovascular: Normal rate, regular rhythm and normal heart sounds. Exam reveals no gallop.  No murmur heard. Pulmonary/Chest: Effort normal and breath sounds normal. No respiratory distress. She has no wheezes. She has no rales.  Abdominal: Soft. Bowel sounds are normal. She exhibits no distension. There is tenderness. There is no rebound.  Musculoskeletal: Normal range of motion. She exhibits no edema or tenderness.  Neurological: She is alert and oriented to person, place, and time. No cranial nerve deficit.  Skin: No rash noted. No erythema.  Psychiatric: Affect normal.   LABORATORY PANEL:  Female CBC Recent Labs  Lab 03/24/17 0510  WBC 6.4  HGB 9.5*  HCT 29.3*  PLT 214   ------------------------------------------------------------------------------------------------------------------ Chemistries  Recent Labs  Lab 03/18/17 1915  03/24/17 1125  NA 133*   < > 134*  K 4.0   < > 3.4*  CL 102   < > 104  CO2 20*   < > 17*  GLUCOSE 210*   < > 128*  BUN 21*   < >  43*  CREATININE 1.76*   < > 3.50*  CALCIUM 8.3*   < > 7.8*  AST 54*  --   --   ALT 26  --   --   ALKPHOS 65  --   --   BILITOT 0.7  --   --    < > = values in this interval not displayed.   RADIOLOGY:  No results found. ASSESSMENT AND PLAN:   Sepsis due to  CAP. Dsontinued Zithromax and Rocephin, changed to doxycycline by on-call physician.  Blood cultures are negative so far. Per Dr. Sherrie Mustache, she has finished treatment for atypical PNA so no obvious need for Doxy. Broaden abx to zosyn (to cover anaerobes and pseduomonas)  If MRSA PCR + add IV vanco, IF still febrile tomorrow would consider CT Chest and abd pelvis per Dr. Sampson Goon.  ID consult appreciated.  Continue Zosyn.  CT Chest and abd pelvis: Pneumonia.  Suspect ileus.  No obstruction. Respiration panel show adenovirus. Continue Zosyn and 8 vancomycin per Dr. Sampson Goon.  Acute respiratory failure with hypoxia due to above. Continue oxygen by nasal cannula, NEB PRN.  Robitussin as needed.  C. difficile colitis.  Continue vancomycin p.o.  AKI (acute kidney injury) due to dehydration. Creatinine is up to 3.5. Continue IV fluids, avoid nephrotoxins.  Follow-up BMP and nephrology consult. Hyponatremia.  Normal saline IV. Hypokalemia.  Give potassium.    Diabetes (HCC) -sliding scale insulin with corresponding glucose checks    HTN (hypertension) -hold Norvasc and hydralazine.   Hold atenolol if blood pressure is low.    HLD (hyperlipidemia) -home dose statin  All the records are reviewed and case discussed with Care Management/Social Worker. Management plans discussed with the patient, her son and they are in agreement.  CODE STATUS: Full Code  TOTAL TIME TAKING CARE OF THIS PATIENT: 36 minutes.   More than 50% of the time was spent in counseling/coordination of care: YES  POSSIBLE D/C IN 2-3 DAYS, DEPENDING ON CLINICAL CONDITION.   Shaune Pollack M.D on 03/24/2017 at 3:48 PM  Between 7am to 6pm - Pager - [phone]  After 6pm go to [email or web address] - Therapist, nutritional Hospitalists

## 2017-03-24 NOTE — Progress Notes (Signed)
Pharmacy Antibiotic Note  Ann Price is a 72 y.o. female admitted on 03/18/2017 with  pneumonia.  Pharmacy has been consulted for Zosyn dosing. Vancomycin added per ID. Patient is also on oral vancomycin for C diff.   Plan: With worsening SCr to 3.5 and CrCl 15 ml/min, will adjust Zosyn dosing to 3.375 g EI q 12 hours.   Patient received vancomycin 1000 mg x 2 last PM. Will follow renal function closely and plan on checking a random vancomycin level at the 48 hour mark to evaluate dosing.   Height: 5\' 3"  (160 cm) Weight: 202 lb 8 oz (91.9 kg) IBW/kg (Calculated) : 52.4  Temp (24hrs), Avg:99 F (37.2 C), Min:97.6 F (36.4 C), Max:101.4 F (38.6 C)  Recent Labs  Lab 03/18/17 1915 03/18/17 1955 03/18/17 2153 03/19/17 0322 03/20/17 0503 03/21/17 1227 03/24/17 0510 03/24/17 1125  WBC 8.7  --   --  8.0 6.9 6.5 6.4  --   CREATININE 1.76*  --   --  1.52* 1.28*  --  3.44* 3.50*  LATICACIDVEN  --  1.2 1.0  --   --   --   --   --     Estimated Creatinine Clearance: 15.4 mL/min (A) (by C-G formula based on SCr of 3.5 mg/dL (H)).    Allergies  Allergen Reactions  . Aspirin Itching  . Penicillins Other (See Comments)    Stomach burns Has patient had a PCN reaction causing immediate rash, facial/tongue/throat swelling, SOB or lightheadedness with hypotension: No Has patient had a PCN reaction causing severe rash involving mucus membranes or skin necrosis: No Has patient had a PCN reaction that required hospitalization: No Has patient had a PCN reaction occurring within the last 10 years: No If all of the above answers are "NO", then may proceed with Cephalosporin use.     Antimicrobials this admission: Azithromycin/Ceftriaxone, oral doxycycline, now Zosyn Vancomycin added per ID Dose adjustments this admission:   Microbiology results: 2/20 BCx: NG 2/20 C diff: + 03/20/17 BCx: NGTD UCx: NG Sputum: sent Respiratory panel: adenovirus 03/21/17 MRSA PCR: negative   Thank  you for allowing pharmacy to be a part of this patient's care.  Ann Price, Ann Price, Pharm.Price., BCPS Clinical Pharmacist 03/24/2017 12:36 PM

## 2017-03-25 LAB — BASIC METABOLIC PANEL
Anion gap: 9 (ref 5–15)
BUN: 43 mg/dL — ABNORMAL HIGH (ref 6–20)
CHLORIDE: 106 mmol/L (ref 101–111)
CO2: 20 mmol/L — ABNORMAL LOW (ref 22–32)
CREATININE: 3.81 mg/dL — AB (ref 0.44–1.00)
Calcium: 7.9 mg/dL — ABNORMAL LOW (ref 8.9–10.3)
GFR, EST AFRICAN AMERICAN: 13 mL/min — AB (ref >60)
GFR, EST NON AFRICAN AMERICAN: 11 mL/min — AB (ref >60)
Glucose, Bld: 121 mg/dL — ABNORMAL HIGH (ref 65–99)
POTASSIUM: 3.6 mmol/L (ref 3.5–5.1)
Sodium: 135 mmol/L (ref 135–145)

## 2017-03-25 LAB — GLUCOSE, CAPILLARY
GLUCOSE-CAPILLARY: 106 mg/dL — AB (ref 65–99)
GLUCOSE-CAPILLARY: 203 mg/dL — AB (ref 65–99)
Glucose-Capillary: 149 mg/dL — ABNORMAL HIGH (ref 65–99)

## 2017-03-25 LAB — CULTURE, BLOOD (SINGLE)
Culture: NO GROWTH
Special Requests: ADEQUATE

## 2017-03-25 MED ORDER — VANCOMYCIN 50 MG/ML ORAL SOLUTION
125.0000 mg | Freq: Four times a day (QID) | ORAL | Status: DC
  Administered 2017-03-25 – 2017-03-30 (×19): 125 mg via ORAL
  Filled 2017-03-25 (×23): qty 2.5

## 2017-03-25 MED ORDER — SODIUM CHLORIDE 0.9 % IV SOLN
1.0000 g | INTRAVENOUS | Status: AC
  Administered 2017-03-25 – 2017-03-27 (×3): 1 g via INTRAVENOUS
  Filled 2017-03-25 (×3): qty 10

## 2017-03-25 MED ORDER — CALCIUM CARBONATE ANTACID 500 MG PO CHEW
400.0000 mg | CHEWABLE_TABLET | Freq: Four times a day (QID) | ORAL | Status: DC | PRN
  Filled 2017-03-25: qty 2

## 2017-03-25 NOTE — Progress Notes (Addendum)
[facility]GRESS NOTE Date of Admission:  03/18/2017     ID: Ann Price is a 72 y.o. female with fever, PNA Principal Problem:   Sepsis (HCC) Active Problems:   CAP (community acquired pneumonia)   Diabetes (HCC)   HTN (hypertension)   HLD (hyperlipidemia)   AKI (acute kidney injury) (HCC)   Subjective: Still no fever.   ROS  Eleven systems are reviewed and negative except per hpi  Medications:  Antibiotics Given (last 72 hours)    Date/Time Action Medication Dose Rate   03/22/17 1815 Given   vancomycin (VANCOCIN) 50 mg/mL oral solution 125 mg 125 mg    03/22/17 2101 Given   vancomycin (VANCOCIN) 50 mg/mL oral solution 125 mg 125 mg    03/22/17 2101 New Bag/Given   piperacillin-tazobactam (ZOSYN) IVPB 3.375 g 3.375 g 12.5 mL/hr   03/23/17 0423 New Bag/Given  [Pt. prefernce]   piperacillin-tazobactam (ZOSYN) IVPB 3.375 g 3.375 g 12.5 mL/hr   03/23/17 0926 Given   vancomycin (VANCOCIN) 50 mg/mL oral solution 125 mg 125 mg    03/23/17 1305 Given   vancomycin (VANCOCIN) 50 mg/mL oral solution 125 mg 125 mg    03/23/17 1305 New Bag/Given   piperacillin-tazobactam (ZOSYN) IVPB 3.375 g 3.375 g 12.5 mL/hr   03/23/17 1650 New Bag/Given   vancomycin (VANCOCIN) IVPB 1000 mg/200 mL premix 1,000 mg 200 mL/hr   03/23/17 1915 Given   vancomycin (VANCOCIN) 50 mg/mL oral solution 125 mg 125 mg    03/23/17 2304 Given   vancomycin (VANCOCIN) 50 mg/mL oral solution 125 mg 125 mg    03/23/17 2309 New Bag/Given   piperacillin-tazobactam (ZOSYN) IVPB 3.375 g 3.375 g 12.5 mL/hr   03/23/17 2309 New Bag/Given   vancomycin (VANCOCIN) IVPB 1000 mg/200 mL premix 1,000 mg 200 mL/hr   03/24/17 0501 New Bag/Given   piperacillin-tazobactam (ZOSYN) IVPB 3.375 g 3.375 g 12.5 mL/hr   03/24/17 0935 Given   vancomycin (VANCOCIN) 50 mg/mL oral solution 125 mg 125 mg    03/24/17 1306 Given   vancomycin (VANCOCIN) 50 mg/mL oral solution 125 mg 125 mg    03/24/17 1700 New  Bag/Given   piperacillin-tazobactam (ZOSYN) IVPB 3.375 g 3.375 g 12.5 mL/hr   03/25/17 0557 New Bag/Given   piperacillin-tazobactam (ZOSYN) IVPB 3.375 g 3.375 g 12.5 mL/hr     . atenolol  50 mg Oral Daily  . atorvastatin  40 mg Oral q1800  . clopidogrel  75 mg Oral Daily  . heparin  5,000 Units Subcutaneous Q8H  . insulin aspart  0-5 Units Subcutaneous QHS  . insulin aspart  0-9 Units Subcutaneous TID WC  . sodium chloride flush  3 mL Intravenous Q12H  . vancomycin  125 mg Oral Q6H    Objective: Vital signs in last 24 hours: Temp:  [98.5 F (36.9 C)-99.1 F (37.3 C)] 98.5 F (36.9 C) (02/27 0749) Pulse Rate:  [57-67] 64 (02/27 0749) Resp:  [16-19] 18 (02/27 0749) BP: (109-132)/(54-63) 120/54 (02/27 0749) SpO2:  [90 %-100 %] 100 % (02/27 0749) Weight:  [94.3 kg (207 lb 12.8 oz)] 94.3 kg (207 lb 12.8 oz) (02/27 0426) Constitutional:  Obese, lying in bed, moaning HENT: [place]/AT, PERRLA, no scleral icterus Mouth/Throat: Oropharynx is clear and dry . No oropharyngeal exudate.  Cardiovascular: Normal rate, regular rhythm and normal heart sounds. Pulmonary/Chest: poor air movement.  Neck = supple, no nuchal rigidity Abdominal: Soft. Bowel sounds are normal.  exhibits no distension. There is no tenderness.  Lymphadenopathy:  no cervical adenopathy. No axillary adenopathy Neurological: alert and oriented to person, place, and time.  Skin: Skin is warm and dry. No rash noted. No erythema.  Healed scar on RLE  Psychiatric: a normal mood and affect.  behavior is normal  Lab Results Recent Labs    03/24/17 0510 03/24/17 1125 03/25/17 0439  WBC 6.4  --   --   HGB 9.5*  --   --   HCT 29.3*  --   --   NA  --  134* 135  K  --  3.4* 3.6  CL  --  104 106  CO2  --  17* 20*  BUN  --  43* 43*  CREATININE 3.44* 3.50* 3.81*    Microbiology: Results for orders placed or performed during the hospital encounter of 03/18/17  Culture, blood (routine x 2)     Status: None   Collection Time:  03/18/17  7:55 PM  Result Value Ref Range Status   Specimen Description BLOOD RFOA  Final   Special Requests   Final    BOTTLES DRAWN AEROBIC AND ANAEROBIC Blood Culture adequate volume   Culture   Final    NO GROWTH 5 DAYS Performed at [facility], [address], [place], [place] [address]    Report Status 03/23/2017 FINAL  Final  Culture, blood (routine x 2)     Status: None   Collection Time: 03/18/17  7:55 PM  Result Value Ref Range Status   Specimen Description BLOOD LFOA  Final   Special Requests   Final    BOTTLES DRAWN AEROBIC AND ANAEROBIC Blood Culture adequate volume   Culture   Final    NO GROWTH 5 DAYS Performed at [facility], [address] [place], [place] [address]    Report Status 03/23/2017 FINAL  Final  Urine culture     Status: None   Collection Time: 03/18/17  7:55 PM  Result Value Ref Range Status   Specimen Description   Final    URINE, RANDOM Performed at [facility], [address] [place], [place] [address]    Special Requests   Final    NONE Performed at [facility], [address] [place], [place] [address]    Culture   Final    NO GROWTH Performed at [facility], [address], [place], [place] [address]    Report Status 03/20/2017 FINAL  Final  C difficile quick scan w PCR reflex     Status: Abnormal   Collection Time: 03/18/17 10:38 PM  Result Value Ref Range Status   C Diff antigen POSITIVE (A) NEGATIVE Final   C Diff toxin NEGATIVE NEGATIVE Final   C Diff interpretation Results are indeterminate. See PCR results.  Final    Comment: Performed at [facility], [address] [place], [place] [address]  Gastrointestinal Panel by PCR , Stool     Status: None   Collection Time: 03/18/17 10:38 PM  Result Value Ref Range Status   Campylobacter species NOT DETECTED NOT DETECTED Final   Plesimonas shigelloides NOT DETECTED NOT DETECTED Final   Salmonella species NOT DETECTED  NOT DETECTED Final   Yersinia enterocolitica NOT DETECTED NOT DETECTED Final   Vibrio species NOT DETECTED NOT DETECTED Final   Vibrio cholerae NOT DETECTED NOT DETECTED Final   Enteroaggregative E coli (EAEC) NOT DETECTED NOT DETECTED Final   Enteropathogenic E coli (EPEC) NOT DETECTED NOT DETECTED Final   Enterotoxigenic E  coli (ETEC) NOT DETECTED NOT DETECTED Final   Shiga like toxin producing E coli (STEC) NOT DETECTED NOT DETECTED Final   Shigella/Enteroinvasive E coli (EIEC) NOT DETECTED NOT DETECTED Final   Cryptosporidium NOT DETECTED NOT DETECTED Final   Cyclospora cayetanensis NOT DETECTED NOT DETECTED Final   Entamoeba histolytica NOT DETECTED NOT DETECTED Final   Giardia lamblia NOT DETECTED NOT DETECTED Final   Adenovirus F40/41 NOT DETECTED NOT DETECTED Final   Astrovirus NOT DETECTED NOT DETECTED Final   Norovirus GI/GII NOT DETECTED NOT DETECTED Final   Rotavirus A NOT DETECTED NOT DETECTED Final   Sapovirus (I, II, IV, and V) NOT DETECTED NOT DETECTED Final    Comment: Performed at [facility], [address] [place], [place] [address]  C. Diff by PCR, Reflexed     Status: Abnormal   Collection Time: 03/18/17 10:38 PM  Result Value Ref Range Status   Toxigenic C. Difficile by PCR POSITIVE (A) NEGATIVE Final    Comment: Positive for toxigenic C. difficile with little to no toxin production. Only treat if clinical presentation suggests symptomatic illness. Performed at [facility], [address] [place], [place] [address]   Culture, blood (single) w Reflex to ID Panel     Status: None   Collection Time: 03/20/17  7:01 PM  Result Value Ref Range Status   Specimen Description BLOOD LEFT ANTECUBITAL  Final   Special Requests   Final    BOTTLES DRAWN AEROBIC AND ANAEROBIC Blood Culture adequate volume   Culture   Final    NO GROWTH 5 DAYS Performed at [facility], [address] [place], [place] [address]    Report Status 03/25/2017  FINAL  Final  Respiratory Panel by PCR     Status: Abnormal   Collection Time: 03/21/17  6:47 PM  Result Value Ref Range Status   Adenovirus DETECTED (A) NOT DETECTED Final   Coronavirus 229E NOT DETECTED NOT DETECTED Final   Coronavirus HKU1 NOT DETECTED NOT DETECTED Final   Coronavirus NL63 NOT DETECTED NOT DETECTED Final   Coronavirus OC43 NOT DETECTED NOT DETECTED Final   Metapneumovirus NOT DETECTED NOT DETECTED Final   Rhinovirus / Enterovirus NOT DETECTED NOT DETECTED Final   Influenza A NOT DETECTED NOT DETECTED Final   Influenza B NOT DETECTED NOT DETECTED Final   Parainfluenza Virus 1 NOT DETECTED NOT DETECTED Final   Parainfluenza Virus 2 NOT DETECTED NOT DETECTED Final   Parainfluenza Virus 3 NOT DETECTED NOT DETECTED Final   Parainfluenza Virus 4 NOT DETECTED NOT DETECTED Final   Respiratory Syncytial Virus NOT DETECTED NOT DETECTED Final   Bordetella pertussis NOT DETECTED NOT DETECTED Final   Chlamydophila pneumoniae NOT DETECTED NOT DETECTED Final   Mycoplasma pneumoniae NOT DETECTED NOT DETECTED Final    Comment: Performed at [facility], [address] [place], [place] [address]  MRSA PCR Screening     Status: None   Collection Time: 03/21/17  9:35 PM  Result Value Ref Range Status   MRSA by PCR NEGATIVE NEGATIVE Final    Comment:        The GeneXpert MRSA Assay (FDA approved for NASAL specimens only), is one component of a comprehensive MRSA colonization surveillance program. It is not intended to diagnose MRSA infection nor to guide or monitor treatment for MRSA infections. Performed at [facility], [address], [place], [place] [address]     Studies/Results: No results found.  Assessment/Plan: Ann Price is  a 72 y.o. female admitted with fevers, malaise for several days and found to have LLL PNA, with neg blood cultures, recurrent high fevers and Resp PCR + adenovirus. Flu testing neg. CT abd with only LLL consolidation. WBC  nml but Procalcitonin was elevated and fevers persisted for many days.  C diff test + but diarrhea improving. MRSA PCR negative.  2/26 - worsening renal fxn but fevers improving.  2/27 - no fevers, cr elevated. Recommendations Change zosyn to ceftriaxone DCed IV vanco with ARF Cont oral vanco Cont treatment and eval for ARF  Thank you very much for the consult. Will follow with you.  Mick Sell   03/25/2017, 4:40 PM

## 2017-03-25 NOTE — Progress Notes (Signed)
[facility]  ROUNDING NOTE   Subjective:   More alert. Seated in chair.   Worried about her mother's health who also has colitis.   Objective:  Vital signs in last 24 hours:  Temp:  [98.5 F (36.9 C)-98.6 F (37 C)] 98.5 F (36.9 C) (02/27 0749) Pulse Rate:  [57-66] 66 (02/27 1716) Resp:  [16-18] 18 (02/27 0749) BP: (109-129)/(50-63) 129/50 (02/27 1716) SpO2:  [90 %-100 %] 99 % (02/27 1716) Weight:  [94.3 kg (207 lb 12.8 oz)] 94.3 kg (207 lb 12.8 oz) (02/27 0426)  Weight change:  Filed Weights   03/21/17 0309 03/22/17 0332 03/25/17 0426  Weight: 92.7 kg (204 lb 6.4 oz) 91.9 kg (202 lb 8 oz) 94.3 kg (207 lb 12.8 oz)    Intake/Output: I/O last 3 completed shifts: In: 225 [I.V.:225] Out: 300 [Urine:300]   Intake/Output this shift:  Total I/O In: -  Out: 500 [Urine:500]  Physical Exam: General: NAD,   Head: Normocephalic, atraumatic. Moist oral mucosal membranes  Eyes: Anicteric, PERRL  Neck: Supple, trachea midline  Lungs:  Clear to auscultation  Heart: Regular rate and rhythm  Abdomen:  Soft, nontender,   Extremities:  no peripheral edema.  Neurologic: Nonfocal, moving all four extremities  Skin: No lesions        Basic Metabolic Panel: Recent Labs  Lab 03/18/17 1915 03/19/17 0322 03/20/17 0503 03/24/17 0510 03/24/17 1125 03/25/17 0439  NA 133* 137 137  --  134* 135  K 4.0 3.8 3.6  --  3.4* 3.6  CL 102 105 107  --  104 106  CO2 20* 22 19*  --  17* 20*  GLUCOSE 210* 149* 90  --  128* 121*  BUN 21* 20 19  --  43* 43*  CREATININE 1.76* 1.52* 1.28* 3.44* 3.50* 3.81*  CALCIUM 8.3* 8.2* 8.1*  --  7.8* 7.9*  MG  --   --   --   --  2.4  --     Liver Function Tests: Recent Labs  Lab 03/18/17 1915  AST 54*  ALT 26  ALKPHOS 65  BILITOT 0.7  PROT 8.1  ALBUMIN 3.7   No results for input(s): LIPASE, AMYLASE in the last 168 hours. No results for input(s): AMMONIA in the last 168 hours.  CBC: Recent Labs  Lab 03/18/17 1915  03/19/17 0322 03/20/17 0503 03/21/17 1227 03/24/17 0510  WBC 8.7 8.0 6.9 6.5 6.4  NEUTROABS 7.7*  --   --   --   --   HGB 11.4* 11.5* 11.0* 9.8* 9.5*  HCT 35.2 36.3 34.7* 31.1* 29.3*  MCV 66.9* 68.6* 68.6* 66.9* 66.5*  PLT 187 187 149* 156 214    Cardiac Enzymes: No results for input(s): CKTOTAL, CKMB, CKMBINDEX, TROPONINI in the last 168 hours.  BNP: Invalid input(s): POCBNP  CBG: Recent Labs  Lab 03/24/17 1708 03/24/17 2112 03/25/17 0750 03/25/17 1210 03/25/17 1717  GLUCAP 159* 119* 106* 149* 203*    Microbiology: Results for orders placed or performed during the hospital encounter of 03/18/17  Culture, blood (routine x 2)     Status: None   Collection Time: 03/18/17  7:55 PM  Result Value Ref Range Status   Specimen Description BLOOD RFOA  Final   Special Requests   Final    BOTTLES DRAWN AEROBIC AND ANAEROBIC Blood Culture adequate volume   Culture   Final    NO GROWTH 5 DAYS Performed at [facility], [address] [place], [place] [id number]  Report Status 03/23/2017 FINAL  Final  Culture, blood (routine x 2)     Status: None   Collection Time: 03/18/17  7:55 PM  Result Value Ref Range Status   Specimen Description BLOOD LFOA  Final   Special Requests   Final    BOTTLES DRAWN AEROBIC AND ANAEROBIC Blood Culture adequate volume   Culture   Final    NO GROWTH 5 DAYS Performed at [facility], [address] [place], [place] [address]    Report Status 03/23/2017 FINAL  Final  Urine culture     Status: None   Collection Time: 03/18/17  7:55 PM  Result Value Ref Range Status   Specimen Description   Final    URINE, RANDOM Performed at [facility], [address], [place], [place] [address]    Special Requests   Final    NONE Performed at [facility], [address] [place], [place] [address]    Culture   Final    NO GROWTH Performed at [facility], [address], [place], [place] [address]     Report Status 03/20/2017 FINAL  Final  C difficile quick scan w PCR reflex     Status: Abnormal   Collection Time: 03/18/17 10:38 PM  Result Value Ref Range Status   C Diff antigen POSITIVE (A) NEGATIVE Final   C Diff toxin NEGATIVE NEGATIVE Final   C Diff interpretation Results are indeterminate. See PCR results.  Final    Comment: Performed at [facility], [address] [place], [place] [address]  Gastrointestinal Panel by PCR , Stool     Status: None   Collection Time: 03/18/17 10:38 PM  Result Value Ref Range Status   Campylobacter species NOT DETECTED NOT DETECTED Final   Plesimonas shigelloides NOT DETECTED NOT DETECTED Final   Salmonella species NOT DETECTED NOT DETECTED Final   Yersinia enterocolitica NOT DETECTED NOT DETECTED Final   Vibrio species NOT DETECTED NOT DETECTED Final   Vibrio cholerae NOT DETECTED NOT DETECTED Final   Enteroaggregative E coli (EAEC) NOT DETECTED NOT DETECTED Final   Enteropathogenic E coli (EPEC) NOT DETECTED NOT DETECTED Final   Enterotoxigenic E coli (ETEC) NOT DETECTED NOT DETECTED Final   Shiga like toxin producing E coli (STEC) NOT DETECTED NOT DETECTED Final   Shigella/Enteroinvasive E coli (EIEC) NOT DETECTED NOT DETECTED Final   Cryptosporidium NOT DETECTED NOT DETECTED Final   Cyclospora cayetanensis NOT DETECTED NOT DETECTED Final   Entamoeba histolytica NOT DETECTED NOT DETECTED Final   Giardia lamblia NOT DETECTED NOT DETECTED Final   Adenovirus F40/41 NOT DETECTED NOT DETECTED Final   Astrovirus NOT DETECTED NOT DETECTED Final   Norovirus GI/GII NOT DETECTED NOT DETECTED Final   Rotavirus A NOT DETECTED NOT DETECTED Final   Sapovirus (I, II, IV, and V) NOT DETECTED NOT DETECTED Final    Comment: Performed at [facility], [address] [place], [place] [address]  C. Diff by PCR, Reflexed     Status: Abnormal   Collection Time: 03/18/17 10:38 PM  Result Value Ref Range Status   Toxigenic C. Difficile by PCR  POSITIVE (A) NEGATIVE Final    Comment: Positive for toxigenic C. difficile with little to no toxin production. Only treat if clinical presentation suggests symptomatic illness. Performed at [facility], [address] [place], [place] [address]   Culture, blood (single) w Reflex to ID Panel     Status: None   Collection  Time: 03/20/17  7:01 PM  Result Value Ref Range Status   Specimen Description BLOOD LEFT ANTECUBITAL  Final   Special Requests   Final    BOTTLES DRAWN AEROBIC AND ANAEROBIC Blood Culture adequate volume   Culture   Final    NO GROWTH 5 DAYS Performed at [facility], [address] [place], [place] [address]    Report Status 03/25/2017 FINAL  Final  Respiratory Panel by PCR     Status: Abnormal   Collection Time: 03/21/17  6:47 PM  Result Value Ref Range Status   Adenovirus DETECTED (A) NOT DETECTED Final   Coronavirus 229E NOT DETECTED NOT DETECTED Final   Coronavirus HKU1 NOT DETECTED NOT DETECTED Final   Coronavirus NL63 NOT DETECTED NOT DETECTED Final   Coronavirus OC43 NOT DETECTED NOT DETECTED Final   Metapneumovirus NOT DETECTED NOT DETECTED Final   Rhinovirus / Enterovirus NOT DETECTED NOT DETECTED Final   Influenza A NOT DETECTED NOT DETECTED Final   Influenza B NOT DETECTED NOT DETECTED Final   Parainfluenza Virus 1 NOT DETECTED NOT DETECTED Final   Parainfluenza Virus 2 NOT DETECTED NOT DETECTED Final   Parainfluenza Virus 3 NOT DETECTED NOT DETECTED Final   Parainfluenza Virus 4 NOT DETECTED NOT DETECTED Final   Respiratory Syncytial Virus NOT DETECTED NOT DETECTED Final   Bordetella pertussis NOT DETECTED NOT DETECTED Final   Chlamydophila pneumoniae NOT DETECTED NOT DETECTED Final   Mycoplasma pneumoniae NOT DETECTED NOT DETECTED Final    Comment: Performed at [facility], [address], [place], [place] [address]  MRSA PCR Screening     Status: None   Collection Time: 03/21/17  9:35 PM  Result Value Ref Range Status    MRSA by PCR NEGATIVE NEGATIVE Final    Comment:        The GeneXpert MRSA Assay (FDA approved for NASAL specimens only), is one component of a comprehensive MRSA colonization surveillance program. It is not intended to diagnose MRSA infection nor to guide or monitor treatment for MRSA infections. Performed at [facility], [address] [place] [address] [place], [place] [address]     Coagulation Studies: No results for input(s): LABPROT, INR in the last 72 hours.  Urinalysis: Recent Labs    03/24/17 1745  COLORURINE YELLOW*  LABSPEC 1.015  PHURINE 5.0  GLUCOSEU NEGATIVE  HGBUR MODERATE*  BILIRUBINUR NEGATIVE  KETONESUR NEGATIVE  PROTEINUR 30*  NITRITE NEGATIVE  LEUKOCYTESUR NEGATIVE      Imaging: No results found.   Medications:   . sodium chloride 100 mL/hr at 03/24/17 1245  . cefTRIAXone (ROCEPHIN)  IV 1 g (03/25/17 1746)   . atenolol  50 mg Oral Daily  . atorvastatin  40 mg Oral q1800  . clopidogrel  75 mg Oral Daily  . heparin  5,000 Units Subcutaneous Q8H  . insulin aspart  0-5 Units Subcutaneous QHS  . insulin aspart  0-9 Units Subcutaneous TID WC  . sodium chloride flush  3 mL Intravenous Q12H  . vancomycin  125 mg Oral Q6H   acetaminophen **OR** acetaminophen, calcium carbonate, diphenhydrAMINE, guaiFENesin-dextromethorphan, ipratropium-albuterol, morphine injection, ondansetron **OR** ondansetron (ZOFRAN) IV, oxyCODONE-acetaminophen, temazepam  Assessment/ Plan:  Ms. Ann Price is a 72 y.o. black female with CVA, hypertension, hyperlipidemia, diabetes mellitus type II, who was admitted to [facility] on 03/18/2017 for Community acquired pneumonia, unspecified laterality [J18.9]  1. Acute renal failure with hyponatremia, metabolic acidosis and hypokalemia on chronic kidney disease stage III with hematuria and proteinuria: baseline  creatinine of 1.3, GFR of 49 on 03/17/17 Chronic kidney disease secondary to diabetes and hypertension Acute renal failure  seems to be prerenal azotemia from poor PO intake, colitis and pneumonia - Continue IV NS - holding hydrochlorothiazide and losartan.   2. Hypertension: hypotensive recently. Holding hydralazine - Continue atenolol  3. Diabetes mellitus type II with chronic kidney disease: insulin dependent. On metformin - Continue glucose control.     LOS: 7 Hester Forget 2/27/20196:25 PM

## 2017-03-25 NOTE — Plan of Care (Signed)
  Progressing Clinical Measurements: Ability to maintain clinical measurements within normal limits will improve 03/25/2017 2334 - Progressing by Myles GipKimrey, Chelsy Parrales M, RN Respiratory complications will improve 03/25/2017 2337 - Progressing by Myles GipKimrey, Almas Rake M, RN 03/25/2017 2334 - Progressing by Myles GipKimrey, Phynix Horton M, RN Pain Managment: General experience of comfort will improve 03/25/2017 2337 - Progressing by Myles GipKimrey, Delos Klich M, RN 03/25/2017 2334 - Progressing by Myles GipKimrey, Jachob Mcclean M, RN Safety: Ability to remain free from injury will improve 03/25/2017 2334 - Progressing by Myles GipKimrey, Tashay Bozich M, RN Skin Integrity: Risk for impaired skin integrity will decrease 03/25/2017 2337 - Progressing by Myles GipKimrey, Fantasia Jinkins M, RN 03/25/2017 2334 - Progressing by Myles GipKimrey, Donavin Audino M, RN

## 2017-03-25 NOTE — Progress Notes (Signed)
Pt refuses to let RN try new IV placement. IV team was consulted.

## 2017-03-25 NOTE — Progress Notes (Signed)
[employer] - [place] [facility]   PATIENT NAME: Ann Price    MR#:  [id number]  DATE OF BIRTH:  Oct 24, 1945  SUBJECTIVE:  CHIEF COMPLAINT:   Chief Complaint  Patient presents with  . Fever  . Generalized Body Aches   The patient has no high fever, better cough, generalized weakness, abdominal pain and loose diarrhea x1 times today. Still poor oral intake. On O2 [place] 2 L. REVIEW OF SYSTEMS:  Review of Systems  Constitutional: Positive for fever and malaise/fatigue. Negative for chills.  HENT: Negative for sore throat.   Eyes: Negative for blurred vision and double vision.  Respiratory: Positive for cough and sputum production. Negative for hemoptysis, shortness of breath, wheezing and stridor.   Cardiovascular: Negative for chest pain, palpitations, orthopnea and leg swelling.  Gastrointestinal: Positive for abdominal pain and diarrhea. Negative for blood in stool, melena, nausea and vomiting.  Genitourinary: Negative for dysuria, flank pain and hematuria.  Musculoskeletal: Negative for back pain and joint pain.  Skin: Negative for rash.  Neurological: Positive for weakness. Negative for dizziness, sensory change, focal weakness, seizures, loss of consciousness and headaches.  Endo/Heme/Allergies: Negative for polydipsia.  Psychiatric/Behavioral: Negative for depression. The patient is not nervous/anxious.     DRUG ALLERGIES:   Allergies  Allergen Reactions  . Aspirin Itching  . Penicillins Other (See Comments)    Stomach burns Has patient had a PCN reaction causing immediate rash, facial/tongue/throat swelling, SOB or lightheadedness with hypotension: No Has patient had a PCN reaction causing severe rash involving mucus membranes or skin necrosis: No Has patient had a PCN reaction that required hospitalization: No Has patient had a PCN reaction occurring within the last 10 years: No If all of the above answers are "NO", then may proceed with  Cephalosporin use.    VITALS:  Blood pressure (!) 120/54, pulse 64, temperature 98.5 F (36.9 C), temperature source Oral, resp. rate 18, height 5\' 3"  (1.6 m), weight 207 lb 12.8 oz (94.3 kg), SpO2 100 %. PHYSICAL EXAMINATION:  Physical Exam  Constitutional: She is oriented to person, place, and time and well-developed, well-nourished, and in no distress.  Obesity.  HENT:  Head: Normocephalic.  Mouth/Throat: Oropharynx is clear and moist.  Eyes: Conjunctivae and EOM are normal. Pupils are equal, round, and reactive to light. No scleral icterus.  Neck: Normal range of motion. Neck supple. No JVD present. No tracheal deviation present.  Cardiovascular: Normal rate, regular rhythm and normal heart sounds. Exam reveals no gallop.  No murmur heard. Pulmonary/Chest: Effort normal and breath sounds normal. No respiratory distress. She has no wheezes. She has no rales.  Abdominal: Soft. Bowel sounds are normal. She exhibits no distension. There is tenderness. There is no rebound.  Musculoskeletal: Normal range of motion. She exhibits no edema or tenderness.  Neurological: She is alert and oriented to person, place, and time. No cranial nerve deficit.  Skin: No rash noted. No erythema.  Psychiatric: Affect normal.   LABORATORY PANEL:  Female CBC Recent Labs  Lab 03/24/17 0510  WBC 6.4  HGB 9.5*  HCT 29.3*  PLT 214   ------------------------------------------------------------------------------------------------------------------ Chemistries  Recent Labs  Lab 03/18/17 1915  03/24/17 1125 03/25/17 0439  NA 133*   < > 134* 135  K 4.0   < > 3.4* 3.6  CL 102   < > 104 106  CO2 20*   < > 17* 20*  GLUCOSE 210*   < > 128* 121*  BUN 21*   < >  43* 43*  CREATININE 1.76*   < > 3.50* 3.81*  CALCIUM 8.3*   < > 7.8* 7.9*  MG  --   --  2.4  --   AST 54*  --   --   --   ALT 26  --   --   --   ALKPHOS 65  --   --   --   BILITOT 0.7  --   --   --    < > = values in this interval not  displayed.   RADIOLOGY:  No results found. ASSESSMENT AND PLAN:   Sepsis due to CAP. Dsontinued Zithromax and Rocephin, changed to doxycycline by on-call physician.  Blood cultures are negative so far. Per Dr. Sampson GoonFitzgerald, she has finished treatment for atypical PNA so no obvious need for Doxy. Broaden abx to zosyn (to cover anaerobes and pseduomonas)  If MRSA PCR + add IV vanco, IF still febrile tomorrow would consider CT Chest and abd pelvis per Dr. Sampson GoonFitzgerald.  ID consult appreciated.  Continue Zosyn.  CT Chest and abd pelvis: Pneumonia.  Suspect ileus.  No obstruction. Respiration panel show adenovirus. Continue Zosyn and discontinue vancomycin due to renal failure per Dr. Sampson GoonFitzgerald.  Acute respiratory failure with hypoxia due to above. Continue oxygen by nasal cannula, NEB PRN.  Robitussin as needed.  C. difficile colitis.  Continue vancomycin p.o.  AKI (on chronic kidney disease stage III due to dehydration. 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 Continue IV fluids, avoid nephrotoxins.  Follow-up BMP and Dr. Wynelle LinkKolluru. Hyponatremia.  Improved with normal saline IV. Hypokalemia.  Improved with potassium.    Diabetes (HCC) -sliding scale insulin with corresponding glucose checks    HTN (hypertension) -hold Norvasc and hydralazine.   Hold atenolol if blood pressure is low.    HLD (hyperlipidemia) -home dose statin Discussed with Dr. Wynelle LinkKolluru. All the records are reviewed and case discussed with Care Management/Social Worker. Management plans discussed with the patient, her son and they are in agreement.  CODE STATUS: Full Code  TOTAL TIME TAKING CARE OF THIS PATIENT: 36 minutes.   More than 50% of the time was spent in counseling/coordination of care: YES  POSSIBLE D/C IN 2-3 DAYS, DEPENDING ON CLINICAL CONDITION.   Shaune PollackQing Abdulai Blaylock M.D on 03/25/2017 at 2:08 PM  Between 7am to 6pm - Pager - [phone]  After 6pm go to  [email or web address] - Therapist, nutritionalpassword [email or web address]  [facility] [place] [facility]

## 2017-03-25 NOTE — Progress Notes (Signed)
Glucometer is not sinking yet but as per tech French Ana(Tracy) its Blood sugar is at 135. Will continue to monitor.

## 2017-03-26 LAB — GLUCOSE, CAPILLARY
GLUCOSE-CAPILLARY: 134 mg/dL — AB (ref 65–99)
GLUCOSE-CAPILLARY: 178 mg/dL — AB (ref 65–99)
GLUCOSE-CAPILLARY: 155 mg/dL — AB (ref 65–99)
Glucose-Capillary: 148 mg/dL — ABNORMAL HIGH (ref 65–99)
Glucose-Capillary: 139 mg/dL — ABNORMAL HIGH (ref 65–99)

## 2017-03-26 LAB — LEGIONELLA PNEUMOPHILA SEROGP 1 UR AG: L. pneumophila Serogp 1 Ur Ag: NEGATIVE

## 2017-03-26 MED ORDER — IPRATROPIUM-ALBUTEROL 0.5-2.5 (3) MG/3ML IN SOLN
3.0000 mL | Freq: Four times a day (QID) | RESPIRATORY_TRACT | Status: DC
  Administered 2017-03-26 (×2): 3 mL via RESPIRATORY_TRACT
  Filled 2017-03-26 (×2): qty 3

## 2017-03-26 MED ORDER — IPRATROPIUM-ALBUTEROL 0.5-2.5 (3) MG/3ML IN SOLN
3.0000 mL | Freq: Four times a day (QID) | RESPIRATORY_TRACT | Status: DC
  Filled 2017-03-26 (×2): qty 3

## 2017-03-26 MED ORDER — BUDESONIDE 0.5 MG/2ML IN SUSP
0.5000 mg | Freq: Two times a day (BID) | RESPIRATORY_TRACT | Status: DC
  Administered 2017-03-26: 0.5 mg via RESPIRATORY_TRACT
  Filled 2017-03-26 (×5): qty 2

## 2017-03-26 MED ORDER — IPRATROPIUM-ALBUTEROL 0.5-2.5 (3) MG/3ML IN SOLN: 3.0000 mL | Freq: Four times a day (QID) | RESPIRATORY_TRACT | Status: DC

## 2017-03-26 NOTE — Progress Notes (Signed)
Patient ID: Ann Price, female   DOB: 23-Oct-1945, 72 y.o.   MRN: [id number]  [employer] PROGRESS NOTE  Ann Price NFA:[id number] DOB: 13-Jun-1945 DOA: 03/18/2017 PCP: Leotis Shames, MD  HPI/Subjective: Patient not feeling well.  With cough and diarrhea.  Does not even want to talk about the diarrhea because she cannot control it yet.  Objective: Vitals:   03/26/17 0655 03/26/17 0927  BP: (!) 142/55 (!) 136/54  Pulse: 65 62  Resp: 16 18  Temp: 97.7 F (36.5 C) 98.3 F (36.8 C)  SpO2:  100%    Filed Weights   03/21/17 0309 03/22/17 0332 03/25/17 0426  Weight: 92.7 kg (204 lb 6.4 oz) 91.9 kg (202 lb 8 oz) 94.3 kg (207 lb 12.8 oz)    ROS: Review of Systems  Constitutional: Negative for chills and fever.  Eyes: Negative for blurred vision.  Respiratory: Positive for cough and shortness of breath.   Cardiovascular: Negative for chest pain.  Gastrointestinal: Positive for abdominal pain and diarrhea. Negative for constipation, nausea and vomiting.  Genitourinary: Negative for dysuria.  Musculoskeletal: Negative for joint pain.  Neurological: Negative for dizziness and headaches.   Exam: Physical Exam  Constitutional: She is oriented to person, place, and time.  HENT:  Nose: No mucosal edema.  Mouth/Throat: No oropharyngeal exudate or posterior oropharyngeal edema.  Eyes: Conjunctivae, EOM and lids are normal. Pupils are equal, round, and reactive to light.  Neck: No JVD present. Carotid bruit is not present. No edema present. No thyroid mass and no thyromegaly present.  Cardiovascular: S1 normal and S2 normal. Exam reveals no gallop.  No murmur heard. Pulses:      Dorsalis pedis pulses are 2+ on the right side, and 2+ on the left side.  Respiratory: No respiratory distress. She has decreased breath sounds in the right lower field and the left lower field. She has no wheezes. She has rhonchi in the right lower field and the left lower field. She has no rales.   GI: Soft. Bowel sounds are normal. There is no tenderness.  Musculoskeletal:       Right ankle: She exhibits swelling.       Left ankle: She exhibits swelling.  Lymphadenopathy:    She has no cervical adenopathy.  Neurological: She is alert and oriented to person, place, and time. No cranial nerve deficit.  Skin: Skin is warm. No rash noted. Nails show no clubbing.  Psychiatric: She has a normal mood and affect.      Data Reviewed: Basic Metabolic Panel: Recent Labs  Lab 03/20/17 0503 03/24/17 0510 03/24/17 1125 03/25/17 0439  NA 137  --  134* 135  K 3.6  --  3.4* 3.6  CL 107  --  104 106  CO2 19*  --  17* 20*  GLUCOSE 90  --  128* 121*  BUN 19  --  43* 43*  CREATININE 1.28* 3.44* 3.50* 3.81*  CALCIUM 8.1*  --  7.8* 7.9*  MG  --   --  2.4  --    CBC: Recent Labs  Lab 03/20/17 0503 03/21/17 1227 03/24/17 0510  WBC 6.9 6.5 6.4  HGB 11.0* 9.8* 9.5*  HCT 34.7* 31.1* 29.3*  MCV 68.6* 66.9* 66.5*  PLT 149* 156 214    CBG: Recent Labs  Lab 03/25/17 1210 03/25/17 1717 03/25/17 2144 03/26/17 0733 03/26/17 1216  GLUCAP 149* 203* 134* 148* 139*    Recent Results (from the past 240 hour(s))  Culture, blood (routine x  2)     Status: None   Collection Time: 03/18/17  7:55 PM  Result Value Ref Range Status   Specimen Description BLOOD RFOA  Final   Special Requests   Final    BOTTLES DRAWN AEROBIC AND ANAEROBIC Blood Culture adequate volume   Culture   Final    NO GROWTH 5 DAYS Performed at [facility], [address], [place], [place] [address]    Report Status 03/23/2017 FINAL  Final  Culture, blood (routine x 2)     Status: None   Collection Time: 03/18/17  7:55 PM  Result Value Ref Range Status   Specimen Description BLOOD LFOA  Final   Special Requests   Final    BOTTLES DRAWN AEROBIC AND ANAEROBIC Blood Culture adequate volume   Culture   Final    NO GROWTH 5 DAYS Performed at [facility], [address], [place], [place]  [address]    Report Status 03/23/2017 FINAL  Final  Urine culture     Status: None   Collection Time: 03/18/17  7:55 PM  Result Value Ref Range Status   Specimen Description   Final    URINE, RANDOM Performed at [facility], [address], [place], [place] [address]    Special Requests   Final    NONE Performed at [facility], [address] [place], [place] [address]    Culture   Final    NO GROWTH Performed at [facility], [address], [place], [place] [address]    Report Status 03/20/2017 FINAL  Final  C difficile quick scan w PCR reflex     Status: Abnormal   Collection Time: 03/18/17 10:38 PM  Result Value Ref Range Status   C Diff antigen POSITIVE (A) NEGATIVE Final   C Diff toxin NEGATIVE NEGATIVE Final   C Diff interpretation Results are indeterminate. See PCR results.  Final    Comment: Performed at [facility], [address] [place], [place] [address]  Gastrointestinal Panel by PCR , Stool     Status: None   Collection Time: 03/18/17 10:38 PM  Result Value Ref Range Status   Campylobacter species NOT DETECTED NOT DETECTED Final   Plesimonas shigelloides NOT DETECTED NOT DETECTED Final   Salmonella species NOT DETECTED NOT DETECTED Final   Yersinia enterocolitica NOT DETECTED NOT DETECTED Final   Vibrio species NOT DETECTED NOT DETECTED Final   Vibrio cholerae NOT DETECTED NOT DETECTED Final   Enteroaggregative E coli (EAEC) NOT DETECTED NOT DETECTED Final   Enteropathogenic E coli (EPEC) NOT DETECTED NOT DETECTED Final   Enterotoxigenic E coli (ETEC) NOT DETECTED NOT DETECTED Final   Shiga like toxin producing E coli (STEC) NOT DETECTED NOT DETECTED Final   Shigella/Enteroinvasive E coli (EIEC) NOT DETECTED NOT DETECTED Final   Cryptosporidium NOT DETECTED NOT DETECTED Final   Cyclospora cayetanensis NOT DETECTED NOT DETECTED Final   Entamoeba histolytica NOT DETECTED NOT DETECTED Final   Giardia lamblia NOT DETECTED NOT  DETECTED Final   Adenovirus F40/41 NOT DETECTED NOT DETECTED Final   Astrovirus NOT DETECTED NOT DETECTED Final   Norovirus GI/GII NOT DETECTED NOT DETECTED Final   Rotavirus A NOT DETECTED NOT DETECTED Final   Sapovirus (I, II, IV, and V) NOT DETECTED NOT DETECTED Final    Comment: Performed at [facility], [address] [place], [place] [address]  C. Diff by PCR, Reflexed     Status: Abnormal  Collection Time: 03/18/17 10:38 PM  Result Value Ref Range Status   Toxigenic C. Difficile by PCR POSITIVE (A) NEGATIVE Final    Comment: Positive for toxigenic C. difficile with little to no toxin production. Only treat if clinical presentation suggests symptomatic illness. Performed at [facility], [address] [place], [place] [address]   Culture, blood (single) w Reflex to ID Panel     Status: None   Collection Time: 03/20/17  7:01 PM  Result Value Ref Range Status   Specimen Description BLOOD LEFT ANTECUBITAL  Final   Special Requests   Final    BOTTLES DRAWN AEROBIC AND ANAEROBIC Blood Culture adequate volume   Culture   Final    NO GROWTH 5 DAYS Performed at [facility], [address] [place], [place] [address]    Report Status 03/25/2017 FINAL  Final  Respiratory Panel by PCR     Status: Abnormal   Collection Time: 03/21/17  6:47 PM  Result Value Ref Range Status   Adenovirus DETECTED (A) NOT DETECTED Final   Coronavirus 229E NOT DETECTED NOT DETECTED Final   Coronavirus HKU1 NOT DETECTED NOT DETECTED Final   Coronavirus NL63 NOT DETECTED NOT DETECTED Final   Coronavirus OC43 NOT DETECTED NOT DETECTED Final   Metapneumovirus NOT DETECTED NOT DETECTED Final   Rhinovirus / Enterovirus NOT DETECTED NOT DETECTED Final   Influenza A NOT DETECTED NOT DETECTED Final   Influenza B NOT DETECTED NOT DETECTED Final   Parainfluenza Virus 1 NOT DETECTED NOT DETECTED Final   Parainfluenza Virus 2 NOT DETECTED NOT DETECTED Final   Parainfluenza Virus 3  NOT DETECTED NOT DETECTED Final   Parainfluenza Virus 4 NOT DETECTED NOT DETECTED Final   Respiratory Syncytial Virus NOT DETECTED NOT DETECTED Final   Bordetella pertussis NOT DETECTED NOT DETECTED Final   Chlamydophila pneumoniae NOT DETECTED NOT DETECTED Final   Mycoplasma pneumoniae NOT DETECTED NOT DETECTED Final    Comment: Performed at [facility], [address], [place], [place] [address]  MRSA PCR Screening     Status: None   Collection Time: 03/21/17  9:35 PM  Result Value Ref Range Status   MRSA by PCR NEGATIVE NEGATIVE Final    Comment:        The GeneXpert MRSA Assay (FDA approved for NASAL specimens only), is one component of a comprehensive MRSA colonization surveillance program. It is not intended to diagnose MRSA infection nor to guide or monitor treatment for MRSA infections. Performed at [facility], [address] [place], [place] [address]      Scheduled Meds: . atenolol  50 mg Oral Daily  . atorvastatin  40 mg Oral q1800  . budesonide (PULMICORT) nebulizer solution  0.5 mg Nebulization BID  . clopidogrel  75 mg Oral Daily  . heparin  5,000 Units Subcutaneous Q8H  . insulin aspart  0-5 Units Subcutaneous QHS  . insulin aspart  0-9 Units Subcutaneous TID WC  . ipratropium-albuterol  3 mL Nebulization Q6H  . sodium chloride flush  3 mL Intravenous Q12H  . vancomycin  125 mg Oral Q6H   Continuous Infusions: . sodium chloride 100 mL/hr at 03/24/17 1245  . cefTRIAXone (ROCEPHIN)  IV Stopped (03/25/17 1836)    Assessment/Plan:  1. Clinical sepsis likely secondary to C. difficile colitis.  On p.o. Vancomycin 2. Acute hypoxic respiratory failure on oxygen supplementation.  Try to taper oxygen to off.  Patient is positive for adenovirus.  Dr. Sampson Goon did keep  the patient on Rocephin at this point for pneumonia seen on chest x-ray.  Start nebulizer treatments with budesonide. 3. Acute kidney injury secondary to dehydration.  Unable to  get labs today.  Try to get labs again tomorrow. 4. Hyponatremia improved with IV fluids. 5. Hypokalemia improved with potassium supplementation 6. Type 2 diabetes mellitus on sliding scale insulin 7. Essential hypertension on atenolol only other medications are being held. 8. Hyperlipidemia unspecified on atorvastatin  Code Status:     Code Status Orders  (From admission, onward)        Start     Ordered   03/18/17 2228  Full code  Continuous     03/18/17 2227    Code Status History    Date Active Date Inactive Code Status Order ID Comments User Context   06/21/2016 15:52 06/22/2016 17:05 Full Code [id number]  Houston SirenSainani, Vivek J, MD ED     Disposition Plan: Kidney function will need to be better and patient will need to breathe better prior to disposition  Consultants:  Nephrology  Infectious disease  Antibiotics:  P.o. vancomycin and Rocephin  Time spent: 28 minutes  Rox Mcgriff [employer]  [employer]

## 2017-03-26 NOTE — Progress Notes (Signed)
[facility]  ROUNDING NOTE   Subjective:   Upset last night.   Unable to get new labs this morning.   Patient states she is feeling better. No more diarrhea. Continues to have cough - productive.    Objective:  Vital signs in last 24 hours:  Temp:  [97.7 F (36.5 C)-98.7 F (37.1 C)] 98.3 F (36.8 C) (02/28 0927) Pulse Rate:  [62-109] 62 (02/28 0927) Resp:  [16-18] 18 (02/28 0927) BP: (128-142)/(50-70) 136/54 (02/28 0927) SpO2:  [91 %-100 %] 100 % (02/28 0927)  Weight change:  Filed Weights   03/21/17 0309 03/22/17 0332 03/25/17 0426  Weight: 92.7 kg (204 lb 6.4 oz) 91.9 kg (202 lb 8 oz) 94.3 kg (207 lb 12.8 oz)    Intake/Output: I/O last 3 completed shifts: In: 1220 [P.O.:120; I.V.:1100] Out: 650 [Urine:650]   Intake/Output this shift:  No intake/output data recorded.  Physical Exam: General: NAD,   Head: Normocephalic, atraumatic. Moist oral mucosal membranes  Eyes: Anicteric, PERRL  Neck: Supple, trachea midline  Lungs:  Clear to auscultation  Heart: Regular rate and rhythm  Abdomen:  Soft, nontender,   Extremities:  no peripheral edema.  Neurologic: Nonfocal, moving all four extremities  Skin: No lesions        Basic Metabolic Panel: Recent Labs  Lab 03/20/17 0503 03/24/17 0510 03/24/17 1125 03/25/17 0439  NA 137  --  134* 135  K 3.6  --  3.4* 3.6  CL 107  --  104 106  CO2 19*  --  17* 20*  GLUCOSE 90  --  128* 121*  BUN 19  --  43* 43*  CREATININE 1.28* 3.44* 3.50* 3.81*  CALCIUM 8.1*  --  7.8* 7.9*  MG  --   --  2.4  --     Liver Function Tests: No results for input(s): AST, ALT, ALKPHOS, BILITOT, PROT, ALBUMIN in the last 168 hours. No results for input(s): LIPASE, AMYLASE in the last 168 hours. No results for input(s): AMMONIA in the last 168 hours.  CBC: Recent Labs  Lab 03/20/17 0503 03/21/17 1227 03/24/17 0510  WBC 6.9 6.5 6.4  HGB 11.0* 9.8* 9.5*  HCT 34.7* 31.1* 29.3*  MCV 68.6* 66.9* 66.5*  PLT 149* 156  214    Cardiac Enzymes: No results for input(s): CKTOTAL, CKMB, CKMBINDEX, TROPONINI in the last 168 hours.  BNP: Invalid input(s): POCBNP  CBG: Recent Labs  Lab 03/25/17 1210 03/25/17 1717 03/25/17 2144 03/26/17 0733 03/26/17 1216  GLUCAP 149* 203* 134* 148* 139*    Microbiology: Results for orders placed or performed during the hospital encounter of 03/18/17  Culture, blood (routine x 2)     Status: None   Collection Time: 03/18/17  7:55 PM  Result Value Ref Range Status   Specimen Description BLOOD RFOA  Final   Special Requests   Final    BOTTLES DRAWN AEROBIC AND ANAEROBIC Blood Culture adequate volume   Culture   Final    NO GROWTH 5 DAYS Performed at [facility], [address], [place], [place] [address]    Report Status 03/23/2017 FINAL  Final  Culture, blood (routine x 2)     Status: None   Collection Time: 03/18/17  7:55 PM  Result Value Ref Range Status   Specimen Description BLOOD LFOA  Final   Special Requests   Final    BOTTLES DRAWN AEROBIC AND ANAEROBIC Blood Culture adequate volume   Culture   Final    NO  GROWTH 5 DAYS Performed at [facility], [address] [place] [place], [place] [address]    Report Status 03/23/2017 FINAL  Final  Urine culture     Status: None   Collection Time: 03/18/17  7:55 PM  Result Value Ref Range Status   Specimen Description   Final    URINE, RANDOM Performed at [facility], [address], [place], [place] [address]    Special Requests   Final    NONE Performed at [facility], [address], [place], [place] [address]    Culture   Final    NO GROWTH Performed at [facility], [address] [place], [place] [address]    Report Status 03/20/2017 FINAL  Final  C difficile quick scan w PCR reflex     Status: Abnormal   Collection Time: 03/18/17 10:38 PM  Result Value Ref Range Status   C Diff antigen POSITIVE (A) NEGATIVE Final   C Diff toxin NEGATIVE NEGATIVE  Final   C Diff interpretation Results are indeterminate. See PCR results.  Final    Comment: Performed at [facility], [address] [place], [place] [address]  Gastrointestinal Panel by PCR , Stool     Status: None   Collection Time: 03/18/17 10:38 PM  Result Value Ref Range Status   Campylobacter species NOT DETECTED NOT DETECTED Final   Plesimonas shigelloides NOT DETECTED NOT DETECTED Final   Salmonella species NOT DETECTED NOT DETECTED Final   Yersinia enterocolitica NOT DETECTED NOT DETECTED Final   Vibrio species NOT DETECTED NOT DETECTED Final   Vibrio cholerae NOT DETECTED NOT DETECTED Final   Enteroaggregative E coli (EAEC) NOT DETECTED NOT DETECTED Final   Enteropathogenic E coli (EPEC) NOT DETECTED NOT DETECTED Final   Enterotoxigenic E coli (ETEC) NOT DETECTED NOT DETECTED Final   Shiga like toxin producing E coli (STEC) NOT DETECTED NOT DETECTED Final   Shigella/Enteroinvasive E coli (EIEC) NOT DETECTED NOT DETECTED Final   Cryptosporidium NOT DETECTED NOT DETECTED Final   Cyclospora cayetanensis NOT DETECTED NOT DETECTED Final   Entamoeba histolytica NOT DETECTED NOT DETECTED Final   Giardia lamblia NOT DETECTED NOT DETECTED Final   Adenovirus F40/41 NOT DETECTED NOT DETECTED Final   Astrovirus NOT DETECTED NOT DETECTED Final   Norovirus GI/GII NOT DETECTED NOT DETECTED Final   Rotavirus A NOT DETECTED NOT DETECTED Final   Sapovirus (I, II, IV, and V) NOT DETECTED NOT DETECTED Final    Comment: Performed at [facility], [address] [place], [place] [address]  C. Diff by PCR, Reflexed     Status: Abnormal   Collection Time: 03/18/17 10:38 PM  Result Value Ref Range Status   Toxigenic C. Difficile by PCR POSITIVE (A) NEGATIVE Final    Comment: Positive for toxigenic C. difficile with little to no toxin production. Only treat if clinical presentation suggests symptomatic illness. Performed at [facility], [address]  [place], [place] [address]   Culture, blood (single) w Reflex to ID Panel     Status: None   Collection Time: 03/20/17  7:01 PM  Result Value Ref Range Status   Specimen Description BLOOD LEFT ANTECUBITAL  Final   Special Requests   Final    BOTTLES DRAWN AEROBIC AND ANAEROBIC Blood Culture adequate volume   Culture   Final    NO GROWTH 5 DAYS Performed at [facility], [address] [place], [place] [address]    Report Status  03/25/2017 FINAL  Final  Respiratory Panel by PCR     Status: Abnormal   Collection Time: 03/21/17  6:47 PM  Result Value Ref Range Status   Adenovirus DETECTED (A) NOT DETECTED Final   Coronavirus 229E NOT DETECTED NOT DETECTED Final   Coronavirus HKU1 NOT DETECTED NOT DETECTED Final   Coronavirus NL63 NOT DETECTED NOT DETECTED Final   Coronavirus OC43 NOT DETECTED NOT DETECTED Final   Metapneumovirus NOT DETECTED NOT DETECTED Final   Rhinovirus / Enterovirus NOT DETECTED NOT DETECTED Final   Influenza A NOT DETECTED NOT DETECTED Final   Influenza B NOT DETECTED NOT DETECTED Final   Parainfluenza Virus 1 NOT DETECTED NOT DETECTED Final   Parainfluenza Virus 2 NOT DETECTED NOT DETECTED Final   Parainfluenza Virus 3 NOT DETECTED NOT DETECTED Final   Parainfluenza Virus 4 NOT DETECTED NOT DETECTED Final   Respiratory Syncytial Virus NOT DETECTED NOT DETECTED Final   Bordetella pertussis NOT DETECTED NOT DETECTED Final   Chlamydophila pneumoniae NOT DETECTED NOT DETECTED Final   Mycoplasma pneumoniae NOT DETECTED NOT DETECTED Final    Comment: Performed at [facility], [address], [place], [place] [address]  MRSA PCR Screening     Status: None   Collection Time: 03/21/17  9:35 PM  Result Value Ref Range Status   MRSA by PCR NEGATIVE NEGATIVE Final    Comment:        The GeneXpert MRSA Assay (FDA approved for NASAL specimens only), is one component of a comprehensive MRSA colonization surveillance program. It is not intended to diagnose  MRSA infection nor to guide or monitor treatment for MRSA infections. Performed at [facility], [address] [place], [place] [address]     Coagulation Studies: No results for input(s): LABPROT, INR in the last 72 hours.  Urinalysis: Recent Labs    03/24/17 1745  COLORURINE YELLOW*  LABSPEC 1.015  PHURINE 5.0  GLUCOSEU NEGATIVE  HGBUR MODERATE*  BILIRUBINUR NEGATIVE  KETONESUR NEGATIVE  PROTEINUR 30*  NITRITE NEGATIVE  LEUKOCYTESUR NEGATIVE      Imaging: No results found.   Medications:   . sodium chloride 100 mL/hr at 03/24/17 1245  . cefTRIAXone (ROCEPHIN)  IV Stopped (03/25/17 1836)   . atenolol  50 mg Oral Daily  . atorvastatin  40 mg Oral q1800  . budesonide (PULMICORT) nebulizer solution  0.5 mg Nebulization BID  . clopidogrel  75 mg Oral Daily  . heparin  5,000 Units Subcutaneous Q8H  . insulin aspart  0-5 Units Subcutaneous QHS  . insulin aspart  0-9 Units Subcutaneous TID WC  . ipratropium-albuterol  3 mL Nebulization Q6H  . sodium chloride flush  3 mL Intravenous Q12H  . vancomycin  125 mg Oral Q6H   acetaminophen **OR** acetaminophen, calcium carbonate, diphenhydrAMINE, guaiFENesin-dextromethorphan, morphine injection, ondansetron **OR** ondansetron (ZOFRAN) IV, oxyCODONE-acetaminophen, temazepam  Assessment/ Plan:  Ms. Ann Price is a 72 y.o. black female with CVA, hypertension, hyperlipidemia, diabetes mellitus type II, who was admitted to [facility] on 03/18/2017 for Community acquired pneumonia, unspecified laterality [J18.9]  1. Acute renal failure with hyponatremia, metabolic acidosis and hypokalemia on chronic kidney disease stage III with hematuria and proteinuria: baseline creatinine of 1.3, GFR of 49 on 03/17/17 Chronic kidney disease secondary to diabetes and hypertension Acute renal failure seems to be prerenal azotemia from poor PO intake, colitis and pneumonia - Continue IV NS 120mL/hr - holding hydrochlorothiazide and  losartan.  - Labs tomorrow  2. Hypertension: blood pressure now  at goal.  - Continue atenolol  3. Diabetes mellitus type II with chronic kidney disease: insulin dependent. On metformin - Continue glucose control.   4. Pneumonia: ID has changed to ceftriaxone  5. Colitis - PO vancomycin.    LOS: 8 Pace Lamadrid 2/28/20191:52 PM

## 2017-03-26 NOTE — Progress Notes (Signed)
Docotor Pyreddy called and ordered to decreased normal Saline 0.9% from 100 ml/ Hr to 50 ml/Hr. Will continue to monitor

## 2017-03-26 NOTE — Care Management (Addendum)
Barriers- decline in renal status.  Holding nephrotoxins, IVF, IV antibiotics for consolidated pneumonia bilaterally. No temp spikes within last 24 hours.  Discussed mobilization of patient and possible need for physical therapy consult; need to wean 02 during progression

## 2017-03-26 NOTE — Progress Notes (Signed)
Pt legs are swollen. Notify prime. Awaiting callback. Will continue to monitor.

## 2017-03-27 LAB — GLUCOSE, CAPILLARY
GLUCOSE-CAPILLARY: 123 mg/dL — AB (ref 65–99)
Glucose-Capillary: 169 mg/dL — ABNORMAL HIGH (ref 65–99)
Glucose-Capillary: 135 mg/dL — ABNORMAL HIGH (ref 65–99)
Glucose-Capillary: 158 mg/dL — ABNORMAL HIGH (ref 65–99)

## 2017-03-27 LAB — BASIC METABOLIC PANEL
Anion gap: 9 (ref 5–15)
BUN: 31 mg/dL — AB (ref 6–20)
CALCIUM: 8 mg/dL — AB (ref 8.9–10.3)
CO2: 17 mmol/L — ABNORMAL LOW (ref 22–32)
CREATININE: 2.98 mg/dL — AB (ref 0.44–1.00)
Chloride: 114 mmol/L — ABNORMAL HIGH (ref 101–111)
GFR calc Af Amer: 17 mL/min — ABNORMAL LOW (ref >60)
GFR calc non Af Amer: 15 mL/min — ABNORMAL LOW (ref >60)
GLUCOSE: 147 mg/dL — AB (ref 65–99)
POTASSIUM: 3.2 mmol/L — AB (ref 3.5–5.1)
Sodium: 140 mmol/L (ref 135–145)

## 2017-03-27 MED ORDER — AMLODIPINE BESYLATE 10 MG PO TABS
10.0000 mg | ORAL_TABLET | Freq: Every day | ORAL | Status: DC
  Administered 2017-03-27 – 2017-03-30 (×4): 10 mg via ORAL
  Filled 2017-03-27 (×4): qty 1

## 2017-03-27 MED ORDER — IPRATROPIUM-ALBUTEROL 0.5-2.5 (3) MG/3ML IN SOLN
3.0000 mL | Freq: Two times a day (BID) | RESPIRATORY_TRACT | Status: DC
  Filled 2017-03-27 (×2): qty 3

## 2017-03-27 NOTE — Plan of Care (Signed)
  Progressing Clinical Measurements: Ability to maintain clinical measurements within normal limits will improve 03/27/2017 0034 - Progressing by Myles GipKimrey, Arden Tinoco M, RN Respiratory complications will improve 03/27/2017 0034 - Progressing by Myles GipKimrey, Luretha Eberly M, RN Pain Managment: General experience of comfort will improve 03/27/2017 0034 - Progressing by Myles GipKimrey, Savior Himebaugh M, RN Safety: Ability to remain free from injury will improve 03/27/2017 0034 - Progressing by Myles GipKimrey, Mikaya Bunner M, RN

## 2017-03-27 NOTE — Progress Notes (Signed)
[facility]  ROUNDING NOTE   Subjective:   Diarrhea improving.   Cough less productive  NS at 74mL/hr   Objective:  Vital signs in last 24 hours:  Temp:  [98 F (36.7 C)-98.3 F (36.8 C)] 98 F (36.7 C) (03/01 0809) Pulse Rate:  [66-71] 70 (03/01 0809) Resp:  [18] 18 (02/28 1622) BP: (118-170)/(69-79) 170/69 (03/01 0809) SpO2:  [93 %-100 %] 95 % (03/01 0809) Weight:  [96.8 kg (213 lb 8 oz)] 96.8 kg (213 lb 8 oz) (03/01 0557)  Weight change:  Filed Weights   03/22/17 0332 03/25/17 0426 03/27/17 0557  Weight: 91.9 kg (202 lb 8 oz) 94.3 kg (207 lb 12.8 oz) 96.8 kg (213 lb 8 oz)    Intake/Output: I/O last 3 completed shifts: In: 3269.2 [P.O.:120; I.V.:2949.2; IV Piggyback:200] Out: 150 [Urine:150]   Intake/Output this shift:  Total I/O In: -  Out: 1 [Stool:1]  Physical Exam: General: NAD,   Head: Normocephalic, atraumatic. Moist oral mucosal membranes  Eyes: Anicteric, PERRL  Neck: Supple, trachea midline  Lungs:  Clear to auscultation  Heart: Regular rate and rhythm  Abdomen:  Soft, nontender,   Extremities:  no peripheral edema.  Neurologic: Nonfocal, moving all four extremities  Skin: No lesions        Basic Metabolic Panel: Recent Labs  Lab 03/24/17 0510 03/24/17 1125 03/25/17 0439 03/27/17 0431  NA  --  134* 135 140  K  --  3.4* 3.6 3.2*  CL  --  104 106 114*  CO2  --  17* 20* 17*  GLUCOSE  --  128* 121* 147*  BUN  --  43* 43* 31*  CREATININE 3.44* 3.50* 3.81* 2.98*  CALCIUM  --  7.8* 7.9* 8.0*  MG  --  2.4  --   --     Liver Function Tests: No results for input(s): AST, ALT, ALKPHOS, BILITOT, PROT, ALBUMIN in the last 168 hours. No results for input(s): LIPASE, AMYLASE in the last 168 hours. No results for input(s): AMMONIA in the last 168 hours.  CBC: Recent Labs  Lab 03/21/17 1227 03/24/17 0510  WBC 6.5 6.4  HGB 9.8* 9.5*  HCT 31.1* 29.3*  MCV 66.9* 66.5*  PLT 156 214    Cardiac Enzymes: No results for  input(s): CKTOTAL, CKMB, CKMBINDEX, TROPONINI in the last 168 hours.  BNP: Invalid input(s): POCBNP  CBG: Recent Labs  Lab 03/26/17 0733 03/26/17 1216 03/26/17 1739 03/26/17 2103 03/27/17 0810  GLUCAP 148* 139* 178* 155* 123*    Microbiology: Results for orders placed or performed during the hospital encounter of 03/18/17  Culture, blood (routine x 2)     Status: None   Collection Time: 03/18/17  7:55 PM  Result Value Ref Range Status   Specimen Description BLOOD RFOA  Final   Special Requests   Final    BOTTLES DRAWN AEROBIC AND ANAEROBIC Blood Culture adequate volume   Culture   Final    NO GROWTH 5 DAYS Performed at [facility], [address] [place], [place] [address]    Report Status 03/23/2017 FINAL  Final  Culture, blood (routine x 2)     Status: None   Collection Time: 03/18/17  7:55 PM  Result Value Ref Range Status   Specimen Description BLOOD LFOA  Final   Special Requests   Final    BOTTLES DRAWN AEROBIC AND ANAEROBIC Blood Culture adequate volume   Culture   Final    NO GROWTH 5 DAYS Performed  at [facility], [address] [place], [place] [address]    Report Status 03/23/2017 FINAL  Final  Urine culture     Status: None   Collection Time: 03/18/17  7:55 PM  Result Value Ref Range Status   Specimen Description   Final    URINE, RANDOM Performed at [facility], [address] [place], [place] [address]    Special Requests   Final    NONE Performed at [facility], [address] [place], [place] [address]    Culture   Final    NO GROWTH Performed at [facility], [address] [place], [place] [address]    Report Status 03/20/2017 FINAL  Final  C difficile quick scan w PCR reflex     Status: Abnormal   Collection Time: 03/18/17 10:38 PM  Result Value Ref Range Status   C Diff antigen POSITIVE (A) NEGATIVE Final   C Diff toxin NEGATIVE NEGATIVE Final   C Diff interpretation Results are  indeterminate. See PCR results.  Final    Comment: Performed at [facility], [address] [place], [place] [address]  Gastrointestinal Panel by PCR , Stool     Status: None   Collection Time: 03/18/17 10:38 PM  Result Value Ref Range Status   Campylobacter species NOT DETECTED NOT DETECTED Final   Plesimonas shigelloides NOT DETECTED NOT DETECTED Final   Salmonella species NOT DETECTED NOT DETECTED Final   Yersinia enterocolitica NOT DETECTED NOT DETECTED Final   Vibrio species NOT DETECTED NOT DETECTED Final   Vibrio cholerae NOT DETECTED NOT DETECTED Final   Enteroaggregative E coli (EAEC) NOT DETECTED NOT DETECTED Final   Enteropathogenic E coli (EPEC) NOT DETECTED NOT DETECTED Final   Enterotoxigenic E coli (ETEC) NOT DETECTED NOT DETECTED Final   Shiga like toxin producing E coli (STEC) NOT DETECTED NOT DETECTED Final   Shigella/Enteroinvasive E coli (EIEC) NOT DETECTED NOT DETECTED Final   Cryptosporidium NOT DETECTED NOT DETECTED Final   Cyclospora cayetanensis NOT DETECTED NOT DETECTED Final   Entamoeba histolytica NOT DETECTED NOT DETECTED Final   Giardia lamblia NOT DETECTED NOT DETECTED Final   Adenovirus F40/41 NOT DETECTED NOT DETECTED Final   Astrovirus NOT DETECTED NOT DETECTED Final   Norovirus GI/GII NOT DETECTED NOT DETECTED Final   Rotavirus A NOT DETECTED NOT DETECTED Final   Sapovirus (I, II, IV, and V) NOT DETECTED NOT DETECTED Final    Comment: Performed at [facility], [address] [place], [place] [address]  C. Diff by PCR, Reflexed     Status: Abnormal   Collection Time: 03/18/17 10:38 PM  Result Value Ref Range Status   Toxigenic C. Difficile by PCR POSITIVE (A) NEGATIVE Final    Comment: Positive for toxigenic C. difficile with little to no toxin production. Only treat if clinical presentation suggests symptomatic illness. Performed at [facility], [address] [place], [place] [address]   Culture, blood (single) w  Reflex to ID Panel     Status: None   Collection Time: 03/20/17  7:01 PM  Result Value Ref Range Status   Specimen Description BLOOD LEFT ANTECUBITAL  Final   Special Requests   Final    BOTTLES DRAWN AEROBIC AND ANAEROBIC Blood Culture adequate volume   Culture   Final    NO GROWTH 5 DAYS Performed at [facility], [address] [place], [place] [address]    Report Status 03/25/2017 FINAL  Final  Respiratory Panel by PCR     Status: Abnormal   Collection Time: 03/21/17  6:47 PM  Result Value Ref Range Status   Adenovirus DETECTED (A) NOT DETECTED Final   Coronavirus 229E NOT DETECTED NOT DETECTED Final   Coronavirus HKU1 NOT DETECTED NOT DETECTED Final   Coronavirus NL63 NOT DETECTED NOT DETECTED Final   Coronavirus OC43 NOT DETECTED NOT DETECTED Final   Metapneumovirus NOT DETECTED NOT DETECTED Final   Rhinovirus / Enterovirus NOT DETECTED NOT DETECTED Final   Influenza A NOT DETECTED NOT DETECTED Final   Influenza B NOT DETECTED NOT DETECTED Final   Parainfluenza Virus 1 NOT DETECTED NOT DETECTED Final   Parainfluenza Virus 2 NOT DETECTED NOT DETECTED Final   Parainfluenza Virus 3 NOT DETECTED NOT DETECTED Final   Parainfluenza Virus 4 NOT DETECTED NOT DETECTED Final   Respiratory Syncytial Virus NOT DETECTED NOT DETECTED Final   Bordetella pertussis NOT DETECTED NOT DETECTED Final   Chlamydophila pneumoniae NOT DETECTED NOT DETECTED Final   Mycoplasma pneumoniae NOT DETECTED NOT DETECTED Final    Comment: Performed at [facility], [address], [place], [place] [address]  MRSA PCR Screening     Status: None   Collection Time: 03/21/17  9:35 PM  Result Value Ref Range Status   MRSA by PCR NEGATIVE NEGATIVE Final    Comment:        The GeneXpert MRSA Assay (FDA approved for NASAL specimens only), is one component of a comprehensive MRSA colonization surveillance program. It is not intended to diagnose MRSA infection nor to guide or monitor treatment  for MRSA infections. Performed at [facility], [address] [place], [place] [address]     Coagulation Studies: No results for input(s): LABPROT, INR in the last 72 hours.  Urinalysis: Recent Labs    03/24/17 1745  COLORURINE YELLOW*  LABSPEC 1.015  PHURINE 5.0  GLUCOSEU NEGATIVE  HGBUR MODERATE*  BILIRUBINUR NEGATIVE  KETONESUR NEGATIVE  PROTEINUR 30*  NITRITE NEGATIVE  LEUKOCYTESUR NEGATIVE      Imaging: No results found.   Medications:   . sodium chloride 50 mL/hr at 03/26/17 2243  . cefTRIAXone (ROCEPHIN)  IV Stopped (03/26/17 1905)   . atenolol  50 mg Oral Daily  . atorvastatin  40 mg Oral q1800  . budesonide (PULMICORT) nebulizer solution  0.5 mg Nebulization BID  . clopidogrel  75 mg Oral Daily  . heparin  5,000 Units Subcutaneous Q8H  . insulin aspart  0-5 Units Subcutaneous QHS  . insulin aspart  0-9 Units Subcutaneous TID WC  . ipratropium-albuterol  3 mL Nebulization QID  . sodium chloride flush  3 mL Intravenous Q12H  . vancomycin  125 mg Oral Q6H   acetaminophen **OR** acetaminophen, calcium carbonate, diphenhydrAMINE, guaiFENesin-dextromethorphan, morphine injection, ondansetron **OR** ondansetron (ZOFRAN) IV, oxyCODONE-acetaminophen, temazepam  Assessment/ Plan:  Ms. Ann Price is a 72 y.o. black female with CVA, hypertension, hyperlipidemia, diabetes mellitus type II, who was admitted to [facility] on 03/18/2017 for Community acquired pneumonia, unspecified laterality [J18.9]  1. Acute renal failure with hyponatremia, metabolic acidosis and hypokalemia on chronic kidney disease stage III with hematuria and proteinuria: baseline creatinine of 1.3, GFR of 49 on 03/17/17 Chronic kidney disease secondary to diabetes and hypertension Acute renal failure seems to be prerenal azotemia from poor PO intake, colitis and pneumonia - Continue IV NS   - holding hydrochlorothiazide and losartan.    2. Hypertension: blood pressure now with  elevations. Home regimen of amlodipine,  atenolol, hydralazine, hydrochlorothiazide and losartan - Continue atenolol - Restart amlodipine  3. Diabetes mellitus type II with chronic kidney disease: insulin dependent. Holding metformin - Continue glucose control.   4. Pneumonia - ceftriaxone - Appreciate ID input  5. Colitis - PO vancomycin.    LOS: 9 Ann Price 3/1/201911:38 AM

## 2017-03-27 NOTE — Plan of Care (Addendum)
Pt is A&Ox4, forgetful, and irritable. VSS . NSR on monitor. RA . OOB with assist. Pt worked with PT today.  Enteric and droplet isolation precautions maintained per orders. Family at bedside earlier in shift. IVF infusing per order. Pt c/o abdominal pain this shift, relieved with prn percocet per order. Pt also c/o nausea with one episode of emesis this shift, relieved with prn zofran per order. Antibiotics continued per orders. Will continue to monitor and report to oncoming RN .  Progressing Education: Knowledge of General Education information will improve 03/27/2017 1507 - Progressing by Jodie EchevariaWhite, Autymn Omlor L, RN Health Behavior/Discharge Planning: Ability to manage health-related needs will improve 03/27/2017 1507 - Progressing by Jodie EchevariaWhite, Valorie Mcgrory L, RN Clinical Measurements: Ability to maintain clinical measurements within normal limits will improve 03/27/2017 1507 - Progressing by Jodie EchevariaWhite, Carlyne Keehan L, RN Will remain free from infection 03/27/2017 1507 - Progressing by Jodie EchevariaWhite, Dauntae Derusha L, RN Diagnostic test results will improve 03/27/2017 1507 - Progressing by Jodie EchevariaWhite, Chirsty Armistead L, RN Respiratory complications will improve 03/27/2017 1507 - Progressing by Jodie EchevariaWhite, Sendy Pluta L, RN Cardiovascular complication will be avoided 03/27/2017 1507 - Progressing by Jodie EchevariaWhite, Alynn Ellithorpe L, RN Activity: Risk for activity intolerance will decrease 03/27/2017 1507 - Progressing by Jodie EchevariaWhite, Emily Massar L, RN Nutrition: Adequate nutrition will be maintained 03/27/2017 1507 - Progressing by Jodie EchevariaWhite, Larrisha Babineau L, RN Coping: Level of anxiety will decrease 03/27/2017 1507 - Progressing by Jodie EchevariaWhite, Kea Callan L, RN Elimination: Will not experience complications related to bowel motility 03/27/2017 1507 - Progressing by Jodie EchevariaWhite, Hallis Meditz L, RN Will not experience complications related to urinary retention 03/27/2017 1507 - Progressing by Jodie EchevariaWhite, Jontae Adebayo L, RN Pain Managment: General experience of comfort will improve 03/27/2017 1507 - Progressing by Jodie EchevariaWhite, Ersel Wadleigh L, RN Safety: Ability to remain free  from injury will improve 03/27/2017 1507 - Progressing by Jodie EchevariaWhite, Blakelyn Dinges L, RN Skin Integrity: Risk for impaired skin integrity will decrease 03/27/2017 1507 - Progressing by Jodie EchevariaWhite, Freedom Peddy L, RN Fluid Volume: Hemodynamic stability will improve 03/27/2017 1507 - Progressing by Jodie EchevariaWhite, Daysie Helf L, RN Clinical Measurements: Signs and symptoms of infection will decrease 03/27/2017 1507 - Progressing by Jodie EchevariaWhite, Kolbee Stallman L, RN Respiratory: Ability to maintain adequate ventilation will improve 03/27/2017 1507 - Progressing by Jodie EchevariaWhite, Luvada Salamone L, RN Ability to maintain a clear airway will improve 03/27/2017 1507 - Progressing by Jodie EchevariaWhite, Sandie Swayze L, RN

## 2017-03-27 NOTE — Evaluation (Signed)
Physical Therapy Evaluation Patient Details Name: Ann Price MRN: [id number] DOB: 1945-05-30 Today's Date: 03/27/2017   History of Present Illness  Pt is a 72 y/o F who presented with progressive cough, malaise, fever.  Pt was found to have pneumonia.  Pt was admitted for sepsis likely due to C diff.  Pt with acute hypoxic respiratory failure initially on oxygen supplementation.  Pt is positive for adenovirus.  Pt with acute kidney injury with metabolic acidosis.  Pt's PMH includes stroke, R knee arthroscopy.     Clinical Impression  Pt admitted with above diagnosis. Pt currently with functional limitations due to the deficits listed below (see PT Problem List). Ann Price presents with nausea and generalized fatigue but was agreeable to work with therapy.  She currently requires min guard>min assist for sit>stand due to instability.  Min assist required for first 5 ft of ambulation due to unsteadiness but pt progresses quickly to min guard assist.  Will trial SPC at next session.  Anticipate that once pt's symptoms resolve his mobility will progress closer to her baseline. Pt will benefit from skilled PT to increase their independence and safety with mobility to allow discharge to the venue listed below.      Follow Up Recommendations Home health PT;Supervision for mobility/OOB    Equipment Recommendations  Cane    Recommendations for Other Services       Precautions / Restrictions Precautions Precautions: Fall;Other (comment) Precaution Comments: Monitor O2 Restrictions Weight Bearing Restrictions: No      Mobility  Bed Mobility Overal bed mobility: Needs Assistance Bed Mobility: Supine to Sit     Supine to sit: Min guard;HOB elevated     General bed mobility comments: Pt uses bed rail to elevate trunk and requires increased time due to nausea  Transfers Overall transfer level: Needs assistance Equipment used: 1 person hand held assist Transfers: Sit to/from Stand Sit  to Stand: Min assist;Min guard         General transfer comment: Pt requires min guard to stand from bed and BSC and min assist to stand from chair.  Pt demonstrates instability and reaches out to IV pole for support.  Provided 1 person HHA when standing from chair due to instability.    Ambulation/Gait Ambulation/Gait assistance: Min assist Ambulation Distance (Feet): 35 Feet Assistive device: 1 person hand held assist;None Gait Pattern/deviations: Decreased stride length;Staggering right;Staggering left Gait velocity: decreased Gait velocity interpretation: Below normal speed for age/gender General Gait Details: Pt initially requires 1 person HHA for first 5 ft due to instability.  Pt able to ambulate remaining 30 ft with close min guard assist, although pt still demonstrates instability.    Stairs            Wheelchair Mobility    Modified Rankin (Stroke Patients Only)       Balance Overall balance assessment: Needs assistance Sitting-balance support: No upper extremity supported;Feet supported Sitting balance-Leahy Scale: Good     Standing balance support: Single extremity supported;During functional activity Standing balance-Leahy Scale: Poor Standing balance comment: Pt relies on 1UE support at times to remain steady                              Pertinent Vitals/Pain Pain Assessment: No/denies pain    Home Living Family/patient expects to be discharged to:: Private residence Living Arrangements: Spouse/significant other Available Help at Discharge: Family;Available PRN/intermittently(husband works during the day) Type of Home: [employer]  Access: Stairs to enter Entrance Stairs-Rails: Left;Right;Can reach both Secretary/administratorntrance Stairs-Number of Steps: 4 Home Layout: One level Home Equipment: Cane - single point      Prior Function Level of Independence: Independent         Comments: Pt independent with all ADLs and all aspects of mobility.  No  falls in the past 6 months.  Pt ambulates without AD.      Hand Dominance   Dominant Hand: Right    Extremity/Trunk Assessment   Upper Extremity Assessment Upper Extremity Assessment: Overall WFL for tasks assessed    Lower Extremity Assessment Lower Extremity Assessment: (BLE strength grossly 4/5)       Communication   Communication: No difficulties  Cognition Arousal/Alertness: Awake/alert Behavior During Therapy: WFL for tasks assessed/performed Overall Cognitive Status: Within Functional Limits for tasks assessed                                        General Comments General comments (skin integrity, edema, etc.): SpO2 90-91% supine on RA.  Remainder of session pt remains at or above 95% on RA.      Exercises     Assessment/Plan    PT Assessment Patient needs continued PT services  PT Problem List Decreased strength;Decreased activity tolerance;Decreased balance;Decreased knowledge of use of DME;Decreased safety awareness;Cardiopulmonary status limiting activity;Decreased mobility       PT Treatment Interventions DME instruction;Gait training;Stair training;Functional mobility training;Therapeutic activities;Therapeutic exercise;Balance training;Neuromuscular re-education;Patient/family education;Wheelchair mobility training    PT Goals (Current goals can be found in the Care Plan section)  Acute Rehab PT Goals Patient Stated Goal: to feel better and go home PT Goal Formulation: With patient Time For Goal Achievement: 04/10/17 Potential to Achieve Goals: Good    Frequency Min 2X/week   Barriers to discharge        Co-evaluation               AM-PAC PT "6 Clicks" Daily Activity  Outcome Measure Difficulty turning over in bed (including adjusting bedclothes, sheets and blankets)?: Unable Difficulty moving from lying on back to sitting on the side of the bed? : A Lot Difficulty sitting down on and standing up from a chair with arms  (e.g., wheelchair, bedside commode, etc,.)?: Unable Help needed moving to and from a bed to chair (including a wheelchair)?: A Little Help needed walking in hospital room?: A Little Help needed climbing 3-5 steps with a railing? : A Lot 6 Click Score: 12    End of Session Equipment Utilized During Treatment: Gait belt Activity Tolerance: Patient tolerated treatment well;Patient limited by fatigue Patient left: in chair;with call bell/phone within reach;with chair alarm set Nurse Communication: Mobility status;Other (comment)(SpO2) PT Visit Diagnosis: Unsteadiness on feet (R26.81);Muscle weakness (generalized) (M62.81)    Time: [id number] PT Time Calculation (min) (ACUTE ONLY): 25 min   Charges:   PT Evaluation $PT Eval Low Complexity: 1 Low PT Treatments $Therapeutic Activity: 8-22 mins   PT G Codes:        Encarnacion ChuAshley Zamorah Ailes PT, DPT 03/27/2017, 12:58 PM

## 2017-03-27 NOTE — Progress Notes (Signed)
[facility] CLINIC INFECTIOUS DISEASE PROGRESS NOTE Date of Admission:  03/18/2017     ID: Ann Price is a 72 y.o. female with fever, PNA Principal Problem:   Sepsis (HCC) Active Problems:   CAP (community acquired pneumonia)   Diabetes (HCC)   HTN (hypertension)   HLD (hyperlipidemia)   AKI (acute kidney injury) (HCC)   Subjective: Still no fever. Is OOB. Diarrhea improving.  ROS  Eleven systems are reviewed and negative except per hpi  Medications:  Antibiotics Given (last 72 hours)    Date/Time Action Medication Dose Rate   03/24/17 1700 New Bag/Given   piperacillin-tazobactam (ZOSYN) IVPB 3.375 g 3.375 g 12.5 mL/hr   03/25/17 0557 New Bag/Given   piperacillin-tazobactam (ZOSYN) IVPB 3.375 g 3.375 g 12.5 mL/hr   03/25/17 1746 New Bag/Given   cefTRIAXone (ROCEPHIN) 1 g in sodium chloride 0.9 % 100 mL IVPB 1 g 200 mL/hr   03/25/17 1747 Given   vancomycin (VANCOCIN) 50 mg/mL oral solution 125 mg 125 mg    03/26/17 0005 Given   vancomycin (VANCOCIN) 50 mg/mL oral solution 125 mg 125 mg    03/26/17 1610 Given   vancomycin (VANCOCIN) 50 mg/mL oral solution 125 mg 125 mg    03/26/17 1239 Given   vancomycin (VANCOCIN) 50 mg/mL oral solution 125 mg 125 mg    03/26/17 1803 Given   vancomycin (VANCOCIN) 50 mg/mL oral solution 125 mg 125 mg    03/26/17 1821 New Bag/Given   cefTRIAXone (ROCEPHIN) 1 g in sodium chloride 0.9 % 100 mL IVPB 1 g 200 mL/hr   03/27/17 0021 Given   vancomycin (VANCOCIN) 50 mg/mL oral solution 125 mg 125 mg    03/27/17 0616 Given   vancomycin (VANCOCIN) 50 mg/mL oral solution 125 mg 125 mg    03/27/17 1207 Given   vancomycin (VANCOCIN) 50 mg/mL oral solution 125 mg 125 mg      . Ann Price  10 mg Oral Daily  . atenolol  50 mg Oral Daily  . atorvastatin  40 mg Oral q1800  . budesonide (PULMICORT) nebulizer solution  0.5 mg Nebulization BID  . clopidogrel  75 mg Oral Daily  . heparin  5,000 Units Subcutaneous Q8H  . insulin aspart  0-5 Units  Subcutaneous QHS  . insulin aspart  0-9 Units Subcutaneous TID WC  . ipratropium-albuterol  3 mL Nebulization BID  . sodium chloride flush  3 mL Intravenous Q12H  . vancomycin  125 mg Oral Q6H    Objective: Vital signs in last 24 hours: Temp:  [98 F (36.7 C)-98.3 F (36.8 C)] 98 F (36.7 C) (03/01 0809) Pulse Rate:  [66-77] 77 (03/01 1259) Resp:  [18] 18 (02/28 1622) BP: (118-170)/(69-85) 134/85 (03/01 1259) SpO2:  [93 %-100 %] 99 % (03/01 1259) Weight:  [96.8 kg (213 lb 8 oz)] 96.8 kg (213 lb 8 oz) (03/01 0557) Constitutional:  Obese, sitting in chair HENT: [place]/AT, PERRLA, no scleral icterus Mouth/Throat: Oropharynx is clear and dry . No oropharyngeal exudate.  Cardiovascular: Normal rate, regular rhythm and normal heart sounds. Pulmonary/Chest: poor air movement.  Neck = supple, no nuchal rigidity Abdominal: Soft. Bowel sounds are normal.  exhibits no distension. There is no tenderness.  Lymphadenopathy: no cervical adenopathy. No axillary adenopathy Neurological: alert and oriented to person, place, and time.  Skin: Skin is warm and dry. No rash noted. No erythema.  Healed scar on RLE  Psychiatric: a normal mood and affect.  behavior is normal  Lab Results Recent Labs  03/25/17 0439 03/27/17 0431  NA 135 140  K 3.6 3.2*  CL 106 114*  CO2 20* 17*  BUN 43* 31*  CREATININE 3.81* 2.98*    Microbiology: Results for orders placed or performed during the hospital encounter of 03/18/17  Culture, blood (routine x 2)     Status: None   Collection Time: 03/18/17  7:55 PM  Result Value Ref Range Status   Specimen Description BLOOD RFOA  Final   Special Requests   Final    BOTTLES DRAWN AEROBIC AND ANAEROBIC Blood Culture adequate volume   Culture   Final    NO GROWTH 5 DAYS Performed at [facility], [address] [place], [place] [address]    Report Status 03/23/2017 FINAL  Final  Culture, blood (routine x 2)     Status: None   Collection Time: 03/18/17   7:55 PM  Result Value Ref Range Status   Specimen Description BLOOD LFOA  Final   Special Requests   Final    BOTTLES DRAWN AEROBIC AND ANAEROBIC Blood Culture adequate volume   Culture   Final    NO GROWTH 5 DAYS Performed at [facility], [address] [place], [place] [address]    Report Status 03/23/2017 FINAL  Final  Urine culture     Status: None   Collection Time: 03/18/17  7:55 PM  Result Value Ref Range Status   Specimen Description   Final    URINE, RANDOM Performed at [facility], [address], [place], [place] [address]    Special Requests   Final    NONE Performed at [facility], [address] [place], [place] [address]    Culture   Final    NO GROWTH Performed at [facility], [address], [place], [place] [address]    Report Status 03/20/2017 FINAL  Final  C difficile quick scan w PCR reflex     Status: Abnormal   Collection Time: 03/18/17 10:38 PM  Result Value Ref Range Status   C Diff antigen POSITIVE (A) NEGATIVE Final   C Diff toxin NEGATIVE NEGATIVE Final   C Diff interpretation Results are indeterminate. See PCR results.  Final    Comment: Performed at [facility], [address] [place], [place] [address]  Gastrointestinal Panel by PCR , Stool     Status: None   Collection Time: 03/18/17 10:38 PM  Result Value Ref Range Status   Campylobacter species NOT DETECTED NOT DETECTED Final   Plesimonas shigelloides NOT DETECTED NOT DETECTED Final   Salmonella species NOT DETECTED NOT DETECTED Final   Yersinia enterocolitica NOT DETECTED NOT DETECTED Final   Vibrio species NOT DETECTED NOT DETECTED Final   Vibrio cholerae NOT DETECTED NOT DETECTED Final   Enteroaggregative E coli (EAEC) NOT DETECTED NOT DETECTED Final   Enteropathogenic E coli (EPEC) NOT DETECTED NOT DETECTED Final   Enterotoxigenic E coli (ETEC) NOT DETECTED NOT DETECTED Final   Shiga like toxin producing E coli (STEC) NOT DETECTED  NOT DETECTED Final   Shigella/Enteroinvasive E coli (EIEC) NOT DETECTED NOT DETECTED Final   Cryptosporidium NOT DETECTED NOT DETECTED Final   Cyclospora cayetanensis NOT DETECTED NOT DETECTED Final   Entamoeba histolytica NOT DETECTED NOT DETECTED Final   Giardia lamblia NOT DETECTED NOT DETECTED Final   Adenovirus F40/41 NOT DETECTED NOT DETECTED Final   Astrovirus NOT DETECTED NOT DETECTED Final   Norovirus GI/GII NOT DETECTED NOT DETECTED Final   Rotavirus  A NOT DETECTED NOT DETECTED Final   Sapovirus (I, II, IV, and V) NOT DETECTED NOT DETECTED Final    Comment: Performed at [facility], [address] [place], [place] [address]  C. Diff by PCR, Reflexed     Status: Abnormal   Collection Time: 03/18/17 10:38 PM  Result Value Ref Range Status   Toxigenic C. Difficile by PCR POSITIVE (A) NEGATIVE Final    Comment: Positive for toxigenic C. difficile with little to no toxin production. Only treat if clinical presentation suggests symptomatic illness. Performed at [facility] [address] [place], [place] [address]   Culture, blood (single) w Reflex to ID Panel     Status: None   Collection Time: 03/20/17  7:01 PM  Result Value Ref Range Status   Specimen Description BLOOD LEFT ANTECUBITAL  Final   Special Requests   Final    BOTTLES DRAWN AEROBIC AND ANAEROBIC Blood Culture adequate volume   Culture   Final    NO GROWTH 5 DAYS Performed at [facility], [address] [place], [place] [address]    Report Status 03/25/2017 FINAL  Final  Respiratory Panel by PCR     Status: Abnormal   Collection Time: 03/21/17  6:47 PM  Result Value Ref Range Status   Adenovirus DETECTED (A) NOT DETECTED Final   Coronavirus 229E NOT DETECTED NOT DETECTED Final   Coronavirus HKU1 NOT DETECTED NOT DETECTED Final   Coronavirus NL63 NOT DETECTED NOT DETECTED Final   Coronavirus OC43 NOT DETECTED NOT DETECTED Final   Metapneumovirus NOT DETECTED NOT DETECTED  Final   Rhinovirus / Enterovirus NOT DETECTED NOT DETECTED Final   Influenza A NOT DETECTED NOT DETECTED Final   Influenza B NOT DETECTED NOT DETECTED Final   Parainfluenza Virus 1 NOT DETECTED NOT DETECTED Final   Parainfluenza Virus 2 NOT DETECTED NOT DETECTED Final   Parainfluenza Virus 3 NOT DETECTED NOT DETECTED Final   Parainfluenza Virus 4 NOT DETECTED NOT DETECTED Final   Respiratory Syncytial Virus NOT DETECTED NOT DETECTED Final   Bordetella pertussis NOT DETECTED NOT DETECTED Final   Chlamydophila pneumoniae NOT DETECTED NOT DETECTED Final   Mycoplasma pneumoniae NOT DETECTED NOT DETECTED Final    Comment: Performed at [facility], [address] [place], [place] [address]  MRSA PCR Screening     Status: None   Collection Time: 03/21/17  9:35 PM  Result Value Ref Range Status   MRSA by PCR NEGATIVE NEGATIVE Final    Comment:        The GeneXpert MRSA Assay (FDA approved for NASAL specimens only), is one component of a comprehensive MRSA colonization surveillance program. It is not intended to diagnose MRSA infection nor to guide or monitor treatment for MRSA infections. Performed at [facility], [address], [place], [place] [address]     Studies/Results: No results found.  Assessment/Plan: LI BOBO is a 72 y.o. female admitted with fevers, malaise for several days and found to have LLL PNA, with neg blood cultures, recurrent high fevers and Resp PCR + adenovirus. Flu testing neg. CT abd with only LLL consolidation. WBC nml but Procalcitonin was elevated and fevers persisted for many days.  C diff test + but diarrhea improving. MRSA PCR negative.  2/26 - worsening renal fxn but fevers improving.  2/27 - no fevers, cr elevated. 3/1 - Day 9 abx, no fevers. Renal fxn a little better. Recommendations Cont  Ceftriaxone - stop after  todays dose- total 10 days  Cont oral vanco for 10 more days. Stop date 04/06/17 Cont treatment and eval for  ARF  Will sign off but call with questions.  Thank you very much for the consult.   Mick SellDavid P Lean Fayson   03/27/2017, 3:42 PM

## 2017-03-27 NOTE — Progress Notes (Signed)
Patient ID: Ann Price, female   DOB: 07/25/1945, 72 y.o.   MRN: [id number]  [employer] PROGRESS NOTE  Ann Price [id number] DOB: 09/04/1945 DOA: 03/18/2017 PCP: Leotis ShamesSingh, Jasmine, MD  HPI/Subjective: Seen at bedside.  Says she is feeling really weak but the diarrhea is less. her appetite is poor.  Objective: Vitals:   03/27/17 0557 03/27/17 0809  BP: (!) 153/73 (!) 170/69  Pulse: 71 70  Resp:    Temp: 98.2 F (36.8 C) 98 F (36.7 C)  SpO2: 96% 95%    Filed Weights   03/22/17 0332 03/25/17 0426 03/27/17 0557  Weight: 91.9 kg (202 lb 8 oz) 94.3 kg (207 lb 12.8 oz) 96.8 kg (213 lb 8 oz)    ROS: Review of Systems  Constitutional: Negative for chills and fever.  Eyes: Negative for blurred vision.  Respiratory: Negative for cough and shortness of breath.   Cardiovascular: Negative for chest pain.  Gastrointestinal: Negative for abdominal pain, constipation, diarrhea, nausea and vomiting.  Genitourinary: Negative for dysuria.  Musculoskeletal: Negative for joint pain.  Neurological: Negative for dizziness and headaches.   Exam: Physical Exam  Constitutional: She is oriented to person, place, and time.  HENT:  Nose: No mucosal edema.  Mouth/Throat: No oropharyngeal exudate or posterior oropharyngeal edema.  Eyes: Conjunctivae, EOM and lids are normal. Pupils are equal, round, and reactive to light.  Neck: No JVD present. Carotid bruit is not present. No edema present. No thyroid mass and no thyromegaly present.  Cardiovascular: S1 normal and S2 normal. Exam reveals no gallop.  No murmur heard. Pulses:      Dorsalis pedis pulses are 2+ on the right side, and 2+ on the left side.  Respiratory: No respiratory distress. She has no decreased breath sounds. She has no wheezes. She has no rhonchi. She has no rales.  GI: Soft. Bowel sounds are normal. There is no tenderness.  Musculoskeletal:       Right ankle: She exhibits swelling.       Left ankle: She exhibits  swelling.  Lymphadenopathy:    She has no cervical adenopathy.  Neurological: She is alert and oriented to person, place, and time. No cranial nerve deficit.  Skin: Skin is warm. No rash noted. Nails show no clubbing.  Psychiatric: She has a normal mood and affect.      Data Reviewed: Basic Metabolic Panel: Recent Labs  Lab 03/24/17 0510 03/24/17 1125 03/25/17 0439 03/27/17 0431  NA  --  134* 135 140  K  --  3.4* 3.6 3.2*  CL  --  104 106 114*  CO2  --  17* 20* 17*  GLUCOSE  --  128* 121* 147*  BUN  --  43* 43* 31*  CREATININE 3.44* 3.50* 3.81* 2.98*  CALCIUM  --  7.8* 7.9* 8.0*  MG  --  2.4  --   --    CBC: Recent Labs  Lab 03/21/17 1227 03/24/17 0510  WBC 6.5 6.4  HGB 9.8* 9.5*  HCT 31.1* 29.3*  MCV 66.9* 66.5*  PLT 156 214    CBG: Recent Labs  Lab 03/26/17 1216 03/26/17 1739 03/26/17 2103 03/27/17 0810 03/27/17 1154  GLUCAP 139* 178* 155* 123* 169*    Recent Results (from the past 240 hour(s))  Culture, blood (routine x 2)     Status: None   Collection Time: 03/18/17  7:55 PM  Result Value Ref Range Status   Specimen Description BLOOD RFOA  Final   Special Requests  Final    BOTTLES DRAWN AEROBIC AND ANAEROBIC Blood Culture adequate volume   Culture   Final    NO GROWTH 5 DAYS Performed at [facility], [address] [place], [place] [address]    Report Status 03/23/2017 FINAL  Final  Culture, blood (routine x 2)     Status: None   Collection Time: 03/18/17  7:55 PM  Result Value Ref Range Status   Specimen Description BLOOD LFOA  Final   Special Requests   Final    BOTTLES DRAWN AEROBIC AND ANAEROBIC Blood Culture adequate volume   Culture   Final    NO GROWTH 5 DAYS Performed at [facility], [address], [place], [place] [address]    Report Status 03/23/2017 FINAL  Final  Urine culture     Status: None   Collection Time: 03/18/17  7:55 PM  Result Value Ref Range Status   Specimen Description   Final     URINE, RANDOM Performed at [facility], [address] [place], [place] [address]    Special Requests   Final    NONE Performed at [facility], [address], [place], [place] [address]    Culture   Final    NO GROWTH Performed at [facility], [address], [place], [place] [address]    Report Status 03/20/2017 FINAL  Final  C difficile quick scan w PCR reflex     Status: Abnormal   Collection Time: 03/18/17 10:38 PM  Result Value Ref Range Status   C Diff antigen POSITIVE (A) NEGATIVE Final   C Diff toxin NEGATIVE NEGATIVE Final   C Diff interpretation Results are indeterminate. See PCR results.  Final    Comment: Performed at [facility], [address] [place], [place] [address]  Gastrointestinal Panel by PCR , Stool     Status: None   Collection Time: 03/18/17 10:38 PM  Result Value Ref Range Status   Campylobacter species NOT DETECTED NOT DETECTED Final   Plesimonas shigelloides NOT DETECTED NOT DETECTED Final   Salmonella species NOT DETECTED NOT DETECTED Final   Yersinia enterocolitica NOT DETECTED NOT DETECTED Final   Vibrio species NOT DETECTED NOT DETECTED Final   Vibrio cholerae NOT DETECTED NOT DETECTED Final   Enteroaggregative E coli (EAEC) NOT DETECTED NOT DETECTED Final   Enteropathogenic E coli (EPEC) NOT DETECTED NOT DETECTED Final   Enterotoxigenic E coli (ETEC) NOT DETECTED NOT DETECTED Final   Shiga like toxin producing E coli (STEC) NOT DETECTED NOT DETECTED Final   Shigella/Enteroinvasive E coli (EIEC) NOT DETECTED NOT DETECTED Final   Cryptosporidium NOT DETECTED NOT DETECTED Final   Cyclospora cayetanensis NOT DETECTED NOT DETECTED Final   Entamoeba histolytica NOT DETECTED NOT DETECTED Final   Giardia lamblia NOT DETECTED NOT DETECTED Final   Adenovirus F40/41 NOT DETECTED NOT DETECTED Final   Astrovirus NOT DETECTED NOT DETECTED Final   Norovirus GI/GII NOT DETECTED NOT DETECTED Final   Rotavirus A NOT  DETECTED NOT DETECTED Final   Sapovirus (I, II, IV, and V) NOT DETECTED NOT DETECTED Final    Comment: Performed at [facility], [address] [place], [place] [address]  C. Diff by PCR, Reflexed     Status: Abnormal   Collection Time: 03/18/17 10:38 PM  Result Value Ref Range Status   Toxigenic C. Difficile by PCR POSITIVE (A) NEGATIVE Final    Comment: Positive for toxigenic C. difficile with little to no  toxin production. Only treat if clinical presentation suggests symptomatic illness. Performed at [facility], [address] [place], [place] [address]   Culture, blood (single) w Reflex to ID Panel     Status: None   Collection Time: 03/20/17  7:01 PM  Result Value Ref Range Status   Specimen Description BLOOD LEFT ANTECUBITAL  Final   Special Requests   Final    BOTTLES DRAWN AEROBIC AND ANAEROBIC Blood Culture adequate volume   Culture   Final    NO GROWTH 5 DAYS Performed at [facility], [address] [place], [place] [address]    Report Status 03/25/2017 FINAL  Final  Respiratory Panel by PCR     Status: Abnormal   Collection Time: 03/21/17  6:47 PM  Result Value Ref Range Status   Adenovirus DETECTED (A) NOT DETECTED Final   Coronavirus 229E NOT DETECTED NOT DETECTED Final   Coronavirus HKU1 NOT DETECTED NOT DETECTED Final   Coronavirus NL63 NOT DETECTED NOT DETECTED Final   Coronavirus OC43 NOT DETECTED NOT DETECTED Final   Metapneumovirus NOT DETECTED NOT DETECTED Final   Rhinovirus / Enterovirus NOT DETECTED NOT DETECTED Final   Influenza A NOT DETECTED NOT DETECTED Final   Influenza B NOT DETECTED NOT DETECTED Final   Parainfluenza Virus 1 NOT DETECTED NOT DETECTED Final   Parainfluenza Virus 2 NOT DETECTED NOT DETECTED Final   Parainfluenza Virus 3 NOT DETECTED NOT DETECTED Final   Parainfluenza Virus 4 NOT DETECTED NOT DETECTED Final   Respiratory Syncytial Virus NOT DETECTED NOT DETECTED Final   Bordetella pertussis NOT  DETECTED NOT DETECTED Final   Chlamydophila pneumoniae NOT DETECTED NOT DETECTED Final   Mycoplasma pneumoniae NOT DETECTED NOT DETECTED Final    Comment: Performed at [facility], [address] [place], [place] [address]  MRSA PCR Screening     Status: None   Collection Time: 03/21/17  9:35 PM  Result Value Ref Range Status   MRSA by PCR NEGATIVE NEGATIVE Final    Comment:        The GeneXpert MRSA Assay (FDA approved for NASAL specimens only), is one component of a comprehensive MRSA colonization surveillance program. It is not intended to diagnose MRSA infection nor to guide or monitor treatment for MRSA infections. Performed at [facility], [address] [place], [place] [address]      Scheduled Meds: . amLODipine  10 mg Oral Daily  . atenolol  50 mg Oral Daily  . atorvastatin  40 mg Oral q1800  . budesonide (PULMICORT) nebulizer solution  0.5 mg Nebulization BID  . clopidogrel  75 mg Oral Daily  . heparin  5,000 Units Subcutaneous Q8H  . insulin aspart  0-5 Units Subcutaneous QHS  . insulin aspart  0-9 Units Subcutaneous TID WC  . ipratropium-albuterol  3 mL Nebulization QID  . sodium chloride flush  3 mL Intravenous Q12H  . vancomycin  125 mg Oral Q6H   Continuous Infusions: . sodium chloride 50 mL/hr at 03/26/17 2243  . cefTRIAXone (ROCEPHIN)  IV Stopped (03/26/17 1905)    Assessment/Plan:  1. Clinical sepsis likely secondary to C. difficile colitis.  On p.o. Vancomycin, clinically slowly improving but she feels really weak and has poor appetite acute hypoxic respiratory failure on oxygen supplementation.  Try to taper oxygen to off.  Patient is positive for adenovirus.  Dr. Sampson Goon did keep the patient on Rocephin at this point for pneumonia seen on chest x-ray.  Start nebulizer  treatments with budesonide. 2. Acute kidney injury with metabolic acidosis ;secondary to dehydration.  Slowly improving.  Hyponatremia improved with IV fluids.  May  continue to hold his dizzy, losartan.  Appreciate nephrology following.  Acute renal failure due to prerenal azotemia due to poor p.o. intake with colitis, pneumonia. 3. Hypokalemia improved with potassium supplementation 4. Type 2 diabetes mellitus on sliding scale insulin, hold metformin 5. Essential hypertension on atenolol only other medications are being held. 6. Hyperlipidemia unspecified on atorvastatin  Code Status:     Code Status Orders  (From admission, onward)        Start     Ordered   03/18/17 2228  Full code  Continuous     03/18/17 2227    Code Status History    Date Active Date Inactive Code Status Order ID Comments User Context   06/21/2016 15:52 06/22/2016 17:05 Full Code [id number]  Houston Siren, MD ED     Disposition Plan: Kidney function will need to be better and patient will need to breathe better prior to disposition  Consultants:  Nephrology  Infectious disease  Antibiotics:  P.o. vancomycin and Rocephin  Time spent: 28 minutes  Ann Price [employer]

## 2017-03-28 ENCOUNTER — Inpatient Hospital Stay: Payer: Medicare HMO

## 2017-03-28 LAB — GLUCOSE, CAPILLARY
Glucose-Capillary: 161 mg/dL — ABNORMAL HIGH (ref 65–99)
Glucose-Capillary: 200 mg/dL — ABNORMAL HIGH (ref 65–99)
Glucose-Capillary: 130 mg/dL — ABNORMAL HIGH (ref 65–99)
Glucose-Capillary: 123 mg/dL — ABNORMAL HIGH (ref 65–99)

## 2017-03-28 LAB — BASIC METABOLIC PANEL
Anion gap: 9 (ref 5–15)
BUN: 24 mg/dL — AB (ref 6–20)
CALCIUM: 8 mg/dL — AB (ref 8.9–10.3)
CO2: 18 mmol/L — ABNORMAL LOW (ref 22–32)
Chloride: 113 mmol/L — ABNORMAL HIGH (ref 101–111)
Creatinine, Ser: 2.51 mg/dL — ABNORMAL HIGH (ref 0.44–1.00)
GFR calc Af Amer: 21 mL/min — ABNORMAL LOW (ref >60)
GFR, EST NON AFRICAN AMERICAN: 18 mL/min — AB (ref >60)
GLUCOSE: 178 mg/dL — AB (ref 65–99)
POTASSIUM: 3.3 mmol/L — AB (ref 3.5–5.1)
Sodium: 140 mmol/L (ref 135–145)

## 2017-03-28 LAB — CBC
HCT: 26.8 % — ABNORMAL LOW (ref 35.0–47.0)
Hemoglobin: 8.5 g/dL — ABNORMAL LOW (ref 12.0–16.0)
MCH: 21.2 pg — ABNORMAL LOW (ref 26.0–34.0)
MCHC: 31.8 g/dL — ABNORMAL LOW (ref 32.0–36.0)
MCV: 66.5 fL — ABNORMAL LOW (ref 80.0–100.0)
Platelets: 382 10*3/uL (ref 150–440)
RBC: 4.03 MIL/uL (ref 3.80–5.20)
RDW: 19.7 % — AB (ref 11.5–14.5)
WBC: 8.8 10*3/uL (ref 3.6–11.0)

## 2017-03-28 MED ORDER — PANTOPRAZOLE SODIUM 40 MG IV SOLR
40.0000 mg | Freq: Two times a day (BID) | INTRAVENOUS | Status: DC
  Administered 2017-03-28 – 2017-03-30 (×4): 40 mg via INTRAVENOUS
  Filled 2017-03-28 (×4): qty 40

## 2017-03-28 MED ORDER — METOCLOPRAMIDE HCL 5 MG/ML IJ SOLN
5.0000 mg | Freq: Four times a day (QID) | INTRAMUSCULAR | Status: DC
Start: 2017-03-28 — End: 2017-03-30
  Administered 2017-03-28 – 2017-03-30 (×7): 5 mg via INTRAVENOUS
  Filled 2017-03-28 (×7): qty 2

## 2017-03-28 MED ORDER — HYDRALAZINE HCL 25 MG PO TABS
25.0000 mg | ORAL_TABLET | Freq: Three times a day (TID) | ORAL | Status: DC
  Administered 2017-03-28 – 2017-03-30 (×6): 25 mg via ORAL
  Filled 2017-03-28 (×7): qty 1

## 2017-03-28 MED ORDER — IPRATROPIUM-ALBUTEROL 0.5-2.5 (3) MG/3ML IN SOLN: 3.0000 mL | Freq: Two times a day (BID) | RESPIRATORY_TRACT | Status: DC | PRN

## 2017-03-28 NOTE — Progress Notes (Signed)
[facility]  ROUNDING NOTE   Subjective:   Laying in bed.   Creatinine 2.51 (2.98)  NS at 29mL/hr   Objective:  Vital signs in last 24 hours:  Temp:  [98.2 F (36.8 C)-98.5 F (36.9 C)] 98.2 F (36.8 C) (03/02 0754) Pulse Rate:  [66-77] 66 (03/02 0754) Resp:  [17-18] 17 (03/02 0256) BP: (134-175)/(59-89) 175/78 (03/02 0754) SpO2:  [95 %-99 %] 98 % (03/02 0754)  Weight change:  Filed Weights   03/22/17 0332 03/25/17 0426 03/27/17 0557  Weight: 91.9 kg (202 lb 8 oz) 94.3 kg (207 lb 12.8 oz) 96.8 kg (213 lb 8 oz)    Intake/Output: I/O last 3 completed shifts: In: 614.2 [I.V.:614.2] Out: 351 [Urine:350; Stool:1]   Intake/Output this shift:  Total I/O In: -  Out: 200 [Urine:200]  Physical Exam: General: NAD,   Head: Normocephalic, atraumatic. Moist oral mucosal membranes  Eyes: Anicteric, PERRL  Neck: Supple, trachea midline  Lungs:  Clear to auscultation  Heart: Regular rate and rhythm  Abdomen:  Soft, nontender,   Extremities:  no peripheral edema.  Neurologic: Nonfocal, moving all four extremities  Skin: No lesions        Basic Metabolic Panel: Recent Labs  Lab 03/24/17 0510  03/24/17 1125 03/25/17 0439 03/27/17 0431 03/28/17 0515  NA  --   --  134* 135 140 140  K  --   --  3.4* 3.6 3.2* 3.3*  CL  --   --  104 106 114* 113*  CO2  --   --  17* 20* 17* 18*  GLUCOSE  --   --  128* 121* 147* 178*  BUN  --   --  43* 43* 31* 24*  CREATININE 3.44*  --  3.50* 3.81* 2.98* 2.51*  CALCIUM  --    < > 7.8* 7.9* 8.0* 8.0*  MG  --   --  2.4  --   --   --    < > = values in this interval not displayed.    Liver Function Tests: No results for input(s): AST, ALT, ALKPHOS, BILITOT, PROT, ALBUMIN in the last 168 hours. No results for input(s): LIPASE, AMYLASE in the last 168 hours. No results for input(s): AMMONIA in the last 168 hours.  CBC: Recent Labs  Lab 03/21/17 1227 03/24/17 0510 03/28/17 0526  WBC 6.5 6.4 8.8  HGB 9.8* 9.5* 8.5*   HCT 31.1* 29.3* 26.8*  MCV 66.9* 66.5* 66.5*  PLT 156 214 382    Cardiac Enzymes: No results for input(s): CKTOTAL, CKMB, CKMBINDEX, TROPONINI in the last 168 hours.  BNP: Invalid input(s): POCBNP  CBG: Recent Labs  Lab 03/27/17 0810 03/27/17 1154 03/27/17 1732 03/27/17 2101 03/28/17 0756  GLUCAP 123* 169* 135* 158* 161*    Microbiology: Results for orders placed or performed during the hospital encounter of 03/18/17  Culture, blood (routine x 2)     Status: None   Collection Time: 03/18/17  7:55 PM  Result Value Ref Range Status   Specimen Description BLOOD RFOA  Final   Special Requests   Final    BOTTLES DRAWN AEROBIC AND ANAEROBIC Blood Culture adequate volume   Culture   Final    NO GROWTH 5 DAYS Performed at [facility], [address] [place], [place] [address]    Report Status 03/23/2017 FINAL  Final  Culture, blood (routine x 2)     Status: None   Collection Time: 03/18/17  7:55 PM  Result Value Ref  Range Status   Specimen Description BLOOD LFOA  Final   Special Requests   Final    BOTTLES DRAWN AEROBIC AND ANAEROBIC Blood Culture adequate volume   Culture   Final    NO GROWTH 5 DAYS Performed at [facility], [address], [place], [place] [address]    Report Status 03/23/2017 FINAL  Final  Urine culture     Status: None   Collection Time: 03/18/17  7:55 PM  Result Value Ref Range Status   Specimen Description   Final    URINE, RANDOM Performed at [facility], [address] [place], [place] [address]    Special Requests   Final    NONE Performed at [facility], [address] [place], [place] [address]    Culture   Final    NO GROWTH Performed at [facility], [address] [place], [place] [address]    Report Status 03/20/2017 FINAL  Final  C difficile quick scan w PCR reflex     Status: Abnormal   Collection Time: 03/18/17 10:38 PM  Result Value Ref Range Status   C Diff antigen  POSITIVE (A) NEGATIVE Final   C Diff toxin NEGATIVE NEGATIVE Final   C Diff interpretation Results are indeterminate. See PCR results.  Final    Comment: Performed at [facility], [address] [place], [place] [address]  Gastrointestinal Panel by PCR , Stool     Status: None   Collection Time: 03/18/17 10:38 PM  Result Value Ref Range Status   Campylobacter species NOT DETECTED NOT DETECTED Final   Plesimonas shigelloides NOT DETECTED NOT DETECTED Final   Salmonella species NOT DETECTED NOT DETECTED Final   Yersinia enterocolitica NOT DETECTED NOT DETECTED Final   Vibrio species NOT DETECTED NOT DETECTED Final   Vibrio cholerae NOT DETECTED NOT DETECTED Final   Enteroaggregative E coli (EAEC) NOT DETECTED NOT DETECTED Final   Enteropathogenic E coli (EPEC) NOT DETECTED NOT DETECTED Final   Enterotoxigenic E coli (ETEC) NOT DETECTED NOT DETECTED Final   Shiga like toxin producing E coli (STEC) NOT DETECTED NOT DETECTED Final   Shigella/Enteroinvasive E coli (EIEC) NOT DETECTED NOT DETECTED Final   Cryptosporidium NOT DETECTED NOT DETECTED Final   Cyclospora cayetanensis NOT DETECTED NOT DETECTED Final   Entamoeba histolytica NOT DETECTED NOT DETECTED Final   Giardia lamblia NOT DETECTED NOT DETECTED Final   Adenovirus F40/41 NOT DETECTED NOT DETECTED Final   Astrovirus NOT DETECTED NOT DETECTED Final   Norovirus GI/GII NOT DETECTED NOT DETECTED Final   Rotavirus A NOT DETECTED NOT DETECTED Final   Sapovirus (I, II, IV, and V) NOT DETECTED NOT DETECTED Final    Comment: Performed at [facility], [address] [place], [place] [address]  C. Diff by PCR, Reflexed     Status: Abnormal   Collection Time: 03/18/17 10:38 PM  Result Value Ref Range Status   Toxigenic C. Difficile by PCR POSITIVE (A) NEGATIVE Final    Comment: Positive for toxigenic C. difficile with little to no toxin production. Only treat if clinical presentation suggests symptomatic  illness. Performed at [facility], [address] [place], [place] [address]   Culture, blood (single) w Reflex to ID Panel     Status: None   Collection Time: 03/20/17  7:01 PM  Result Value Ref Range Status   Specimen Description BLOOD LEFT ANTECUBITAL  Final   Special Requests   Final  BOTTLES DRAWN AEROBIC AND ANAEROBIC Blood Culture adequate volume   Culture   Final    NO GROWTH 5 DAYS Performed at [facility], [address] [place], [place] [address]    Report Status 03/25/2017 FINAL  Final  Respiratory Panel by PCR     Status: Abnormal   Collection Time: 03/21/17  6:47 PM  Result Value Ref Range Status   Adenovirus DETECTED (A) NOT DETECTED Final   Coronavirus 229E NOT DETECTED NOT DETECTED Final   Coronavirus HKU1 NOT DETECTED NOT DETECTED Final   Coronavirus NL63 NOT DETECTED NOT DETECTED Final   Coronavirus OC43 NOT DETECTED NOT DETECTED Final   Metapneumovirus NOT DETECTED NOT DETECTED Final   Rhinovirus / Enterovirus NOT DETECTED NOT DETECTED Final   Influenza A NOT DETECTED NOT DETECTED Final   Influenza B NOT DETECTED NOT DETECTED Final   Parainfluenza Virus 1 NOT DETECTED NOT DETECTED Final   Parainfluenza Virus 2 NOT DETECTED NOT DETECTED Final   Parainfluenza Virus 3 NOT DETECTED NOT DETECTED Final   Parainfluenza Virus 4 NOT DETECTED NOT DETECTED Final   Respiratory Syncytial Virus NOT DETECTED NOT DETECTED Final   Bordetella pertussis NOT DETECTED NOT DETECTED Final   Chlamydophila pneumoniae NOT DETECTED NOT DETECTED Final   Mycoplasma pneumoniae NOT DETECTED NOT DETECTED Final    Comment: Performed at [facility], [address], [place], [place] [address]  MRSA PCR Screening     Status: None   Collection Time: 03/21/17  9:35 PM  Result Value Ref Range Status   MRSA by PCR NEGATIVE NEGATIVE Final    Comment:        The GeneXpert MRSA Assay (FDA approved for NASAL specimens only), is one component of a comprehensive MRSA  colonization surveillance program. It is not intended to diagnose MRSA infection nor to guide or monitor treatment for MRSA infections. Performed at [facility], [address] [place], [place] [address]     Coagulation Studies: No results for input(s): LABPROT, INR in the last 72 hours.  Urinalysis: No results for input(s): COLORURINE, LABSPEC, PHURINE, GLUCOSEU, HGBUR, BILIRUBINUR, KETONESUR, PROTEINUR, UROBILINOGEN, NITRITE, LEUKOCYTESUR in the last 72 hours.  Invalid input(s): APPERANCEUR    Imaging: Dg Ankle Complete Right  Result Date: 03/28/2017 CLINICAL DATA:  Diabetic ulcer.  Continued right ankle pain. EXAM: RIGHT ANKLE - COMPLETE 3+ VIEW COMPARISON:  None. FINDINGS: No fracture or dislocation. No soft tissue gas identified. No bony erosions are noted. IMPRESSION: No evidence of osteomyelitis on this study. Electronically Signed   By: Gerome Sam III M.D   On: 03/28/2017 10:36     Medications:   . sodium chloride 50 mL/hr at 03/27/17 2119   . amLODipine  10 mg Oral Daily  . atenolol  50 mg Oral Daily  . atorvastatin  40 mg Oral q1800  . budesonide (PULMICORT) nebulizer solution  0.5 mg Nebulization BID  . clopidogrel  75 mg Oral Daily  . heparin  5,000 Units Subcutaneous Q8H  . insulin aspart  0-5 Units Subcutaneous QHS  . insulin aspart  0-9 Units Subcutaneous TID WC  . ipratropium-albuterol  3 mL Nebulization BID  . sodium chloride flush  3 mL Intravenous Q12H  . vancomycin  125 mg Oral Q6H   acetaminophen **OR** acetaminophen, calcium carbonate, diphenhydrAMINE, guaiFENesin-dextromethorphan, morphine injection, ondansetron **OR** ondansetron (ZOFRAN) IV, oxyCODONE-acetaminophen, temazepam  Assessment/ Plan:  Ann Price is a 72 y.o. black female with CVA, hypertension, hyperlipidemia, diabetes mellitus type II,  who was admitted to [facility] on 03/18/2017 for Community acquired pneumonia, unspecified laterality [J18.9]  1. Acute renal  failure with hyponatremia, metabolic acidosis and hypokalemia on chronic kidney disease stage III with hematuria and proteinuria: baseline creatinine of 1.3, GFR of 49 on 03/17/17 Chronic kidney disease secondary to diabetes and hypertension Acute renal failure seems to be prerenal azotemia from poor PO intake, colitis and pneumonia - Continue IV NS   - holding hydrochlorothiazide and losartan.    2. Hypertension: blood pressure now with elevations. Home regimen of amlodipine, atenolol, hydralazine, hydrochlorothiazide and losartan - Continue atenolol and amlodipine - Restart hydralazine  3. Diabetes mellitus type II with chronic kidney disease: insulin dependent. Holding metformin - Continue glucose control.   4. Pneumonia - ceftriaxone  5. Colitis - PO vancomycin.   Appreciate ID input   LOS: 10 Alayzha An 3/2/201911:09 AM

## 2017-03-28 NOTE — Progress Notes (Signed)
Patient ID: Ann Price, female   DOB: 1945-03-10, 72 y.o.   MRN: [id number]  [employer] PROGRESS NOTE  DONNIKA KUCHER WUJ:[id number] DOB: [birth year] DOA: 03/18/2017 PCP: Leotis Shames, MD  HPI/Subjective: Has nausea but no vomiting.  Poor appetite.  Diarrhea is decreasing no abdominal pain or fever..  Objective: Vitals:   03/28/17 0256 03/28/17 0754  BP: (!) 157/85 (!) 175/78  Pulse: 66 66  Resp: 17   Temp: 98.4 F (36.9 C) 98.2 F (36.8 C)  SpO2: 96% 98%    Filed Weights   03/22/17 0332 03/25/17 0426 03/27/17 0557  Weight: 91.9 kg (202 lb 8 oz) 94.3 kg (207 lb 12.8 oz) 96.8 kg (213 lb 8 oz)    ROS: Review of Systems  Constitutional: Negative for chills and fever.  Eyes: Negative for blurred vision.  Respiratory: Negative for cough and shortness of breath.   Cardiovascular: Negative for chest pain.  Gastrointestinal: Negative for abdominal pain, constipation, diarrhea, nausea and vomiting.  Genitourinary: Negative for dysuria.  Musculoskeletal: Negative for joint pain.  Neurological: Negative for dizziness and headaches.   Exam: Physical Exam  Constitutional: She is oriented to person, place, and time.  HENT:  Nose: No mucosal edema.  Mouth/Throat: No oropharyngeal exudate or posterior oropharyngeal edema.  Eyes: Conjunctivae, EOM and lids are normal. Pupils are equal, round, and reactive to light.  Neck: No JVD present. Carotid bruit is not present. No edema present. No thyroid mass and no thyromegaly present.  Cardiovascular: S1 normal and S2 normal. Exam reveals no gallop.  No murmur heard. Pulses:      Dorsalis pedis pulses are 2+ on the right side, and 2+ on the left side.  Respiratory: No respiratory distress. She has no decreased breath sounds. She has no wheezes. She has no rhonchi. She has no rales.  GI: Soft. Bowel sounds are normal. There is no tenderness.  Musculoskeletal:       Right ankle: She exhibits swelling.       Left ankle: She  exhibits swelling.  Lymphadenopathy:    She has no cervical adenopathy.  Neurological: She is alert and oriented to person, place, and time. No cranial nerve deficit.  Skin: Skin is warm. No rash noted. Nails show no clubbing.  Psychiatric: She has a normal mood and affect.      Data Reviewed: Basic Metabolic Panel: Recent Labs  Lab 03/24/17 0510 03/24/17 1125 03/25/17 0439 03/27/17 0431 03/28/17 0515  NA  --  134* 135 140 140  K  --  3.4* 3.6 3.2* 3.3*  CL  --  104 106 114* 113*  CO2  --  17* 20* 17* 18*  GLUCOSE  --  128* 121* 147* 178*  BUN  --  43* 43* 31* 24*  CREATININE 3.44* 3.50* 3.81* 2.98* 2.51*  CALCIUM  --  7.8* 7.9* 8.0* 8.0*  MG  --  2.4  --   --   --    CBC: Recent Labs  Lab 03/24/17 0510 03/28/17 0526  WBC 6.4 8.8  HGB 9.5* 8.5*  HCT 29.3* 26.8*  MCV 66.5* 66.5*  PLT 214 382    CBG: Recent Labs  Lab 03/27/17 1154 03/27/17 1732 03/27/17 2101 03/28/17 0756 03/28/17 1201  GLUCAP 169* 135* 158* 161* 200*    Recent Results (from the past 240 hour(s))  Culture, blood (routine x 2)     Status: None   Collection Time: 03/18/17  7:55 PM  Result Value Ref Range Status  Specimen Description BLOOD RFOA  Final   Special Requests   Final    BOTTLES DRAWN AEROBIC AND ANAEROBIC Blood Culture adequate volume   Culture   Final    NO GROWTH 5 DAYS Performed at [facility], [address] [place], [place] [address]    Report Status 03/23/2017 FINAL  Final  Culture, blood (routine x 2)     Status: None   Collection Time: 03/18/17  7:55 PM  Result Value Ref Range Status   Specimen Description BLOOD LFOA  Final   Special Requests   Final    BOTTLES DRAWN AEROBIC AND ANAEROBIC Blood Culture adequate volume   Culture   Final    NO GROWTH 5 DAYS Performed at [facility], [address] [place], [place] [address]    Report Status 03/23/2017 FINAL  Final  Urine culture     Status: None   Collection Time: 03/18/17  7:55 PM   Result Value Ref Range Status   Specimen Description   Final    URINE, RANDOM Performed at [facility], [address] [place], [place] [address]    Special Requests   Final    NONE Performed at [facility], [address] [place], [place] [address]    Culture   Final    NO GROWTH Performed at [facility], [address] [place], [place] [address]    Report Status 03/20/2017 FINAL  Final  C difficile quick scan w PCR reflex     Status: Abnormal   Collection Time: 03/18/17 10:38 PM  Result Value Ref Range Status   C Diff antigen POSITIVE (A) NEGATIVE Final   C Diff toxin NEGATIVE NEGATIVE Final   C Diff interpretation Results are indeterminate. See PCR results.  Final    Comment: Performed at [facility], [address] [place], [place] [address]  Gastrointestinal Panel by PCR , Stool     Status: None   Collection Time: 03/18/17 10:38 PM  Result Value Ref Range Status   Campylobacter species NOT DETECTED NOT DETECTED Final   Plesimonas shigelloides NOT DETECTED NOT DETECTED Final   Salmonella species NOT DETECTED NOT DETECTED Final   Yersinia enterocolitica NOT DETECTED NOT DETECTED Final   Vibrio species NOT DETECTED NOT DETECTED Final   Vibrio cholerae NOT DETECTED NOT DETECTED Final   Enteroaggregative E coli (EAEC) NOT DETECTED NOT DETECTED Final   Enteropathogenic E coli (EPEC) NOT DETECTED NOT DETECTED Final   Enterotoxigenic E coli (ETEC) NOT DETECTED NOT DETECTED Final   Shiga like toxin producing E coli (STEC) NOT DETECTED NOT DETECTED Final   Shigella/Enteroinvasive E coli (EIEC) NOT DETECTED NOT DETECTED Final   Cryptosporidium NOT DETECTED NOT DETECTED Final   Cyclospora cayetanensis NOT DETECTED NOT DETECTED Final   Entamoeba histolytica NOT DETECTED NOT DETECTED Final   Giardia lamblia NOT DETECTED NOT DETECTED Final   Adenovirus F40/41 NOT DETECTED NOT DETECTED Final   Astrovirus NOT DETECTED NOT DETECTED Final    Norovirus GI/GII NOT DETECTED NOT DETECTED Final   Rotavirus A NOT DETECTED NOT DETECTED Final   Sapovirus (I, II, IV, and V) NOT DETECTED NOT DETECTED Final    Comment: Performed at [facility], [address] [place], [place] [address]  C. Diff by PCR, Reflexed     Status: Abnormal   Collection Time: 03/18/17 10:38 PM  Result Value Ref Range Status   Toxigenic C. Difficile by PCR POSITIVE (A) NEGATIVE Final  Comment: Positive for toxigenic C. difficile with little to no toxin production. Only treat if clinical presentation suggests symptomatic illness. Performed at [facility], [address] [place], [place] [address]   Culture, blood (single) w Reflex to ID Panel     Status: None   Collection Time: 03/20/17  7:01 PM  Result Value Ref Range Status   Specimen Description BLOOD LEFT ANTECUBITAL  Final   Special Requests   Final    BOTTLES DRAWN AEROBIC AND ANAEROBIC Blood Culture adequate volume   Culture   Final    NO GROWTH 5 DAYS Performed at [facility], [address] [place], [place] [address]    Report Status 03/25/2017 FINAL  Final  Respiratory Panel by PCR     Status: Abnormal   Collection Time: 03/21/17  6:47 PM  Result Value Ref Range Status   Adenovirus DETECTED (A) NOT DETECTED Final   Coronavirus 229E NOT DETECTED NOT DETECTED Final   Coronavirus HKU1 NOT DETECTED NOT DETECTED Final   Coronavirus NL63 NOT DETECTED NOT DETECTED Final   Coronavirus OC43 NOT DETECTED NOT DETECTED Final   Metapneumovirus NOT DETECTED NOT DETECTED Final   Rhinovirus / Enterovirus NOT DETECTED NOT DETECTED Final   Influenza A NOT DETECTED NOT DETECTED Final   Influenza B NOT DETECTED NOT DETECTED Final   Parainfluenza Virus 1 NOT DETECTED NOT DETECTED Final   Parainfluenza Virus 2 NOT DETECTED NOT DETECTED Final   Parainfluenza Virus 3 NOT DETECTED NOT DETECTED Final   Parainfluenza Virus 4 NOT DETECTED NOT DETECTED Final   Respiratory Syncytial  Virus NOT DETECTED NOT DETECTED Final   Bordetella pertussis NOT DETECTED NOT DETECTED Final   Chlamydophila pneumoniae NOT DETECTED NOT DETECTED Final   Mycoplasma pneumoniae NOT DETECTED NOT DETECTED Final    Comment: Performed at [facility], [address] [place], [place] [address]  MRSA PCR Screening     Status: None   Collection Time: 03/21/17  9:35 PM  Result Value Ref Range Status   MRSA by PCR NEGATIVE NEGATIVE Final    Comment:        The GeneXpert MRSA Assay (FDA approved for NASAL specimens only), is one component of a comprehensive MRSA colonization surveillance program. It is not intended to diagnose MRSA infection nor to guide or monitor treatment for MRSA infections. Performed at [facility], [address] [place], [place] [address]      Scheduled Meds: . amLODipine  10 mg Oral Daily  . atenolol  50 mg Oral Daily  . atorvastatin  40 mg Oral q1800  . budesonide (PULMICORT) nebulizer solution  0.5 mg Nebulization BID  . clopidogrel  75 mg Oral Daily  . heparin  5,000 Units Subcutaneous Q8H  . hydrALAZINE  25 mg Oral Q8H  . insulin aspart  0-5 Units Subcutaneous QHS  . insulin aspart  0-9 Units Subcutaneous TID WC  . ipratropium-albuterol  3 mL Nebulization BID  . sodium chloride flush  3 mL Intravenous Q12H  . vancomycin  125 mg Oral Q6H   Continuous Infusions:   Assessment/Plan:  1. Clinical sepsis likely secondary to C. difficile colitis.  On p.o. Vancomycin, clinically slowly improving but she feels really weak and has poor appetite likely due to C. difficile colitis.  Patient needs vancomycin for 10 more days.  Continue nausea medicines and encouraged p.o. intake. 2. acute hypoxic respiratory failure on oxygen supplementation.  Now on room air.  Clinically improving.  Patient finished  10 days of Rocephin.  Reviewed ID note.  Appreciate Dr. Sampson Goon seeing the patient.  3. Acute kidney injury with metabolic acidosis ;secondary to  dehydration.  Slowly improving.  Hyponatremia improved with IV fluids.  Continue IV fluids as per nephrology recommendation.  Continue to hold HCTZ, losartan. 4. Hypokalemia improved with potassium supplementation 5. Type 2 diabetes mellitus on sliding scale insulin, hold metformin 6. Essential hypertension; BP high this morning, continue atenolol, Norvasc, increase hydralazine 7. Hyperlipidemia unspecified on atorvastatin #8 nausea, possible diabetic gastroparesis.  Add Reglan IV, add IV PPI also. #9 right ankle pain: X-ray of the right ankle did not show any osteomyelitis.  Likely degenerative arthritis. Code Status:     Code Status Orders  (From admission, onward)        Start     Ordered   03/18/17 2228  Full code  Continuous     03/18/17 2227    Code Status History    Date Active Date Inactive Code Status Order ID Comments User Context   06/21/2016 15:52 06/22/2016 17:05 Full Code [id number]  Houston Siren, MD ED     Disposition Plan: Kidney function will need to be better and patient will need to breathe better prior to disposition  Consultants:  Nephrology  Infectious disease  Antibiotics:  P.o. vancomycin and Rocephin  Time spent: 28 minutes  Godfrey Tritschler [employer]

## 2017-03-29 LAB — GLUCOSE, CAPILLARY
GLUCOSE-CAPILLARY: 117 mg/dL — AB (ref 65–99)
GLUCOSE-CAPILLARY: 151 mg/dL — AB (ref 65–99)
Glucose-Capillary: 176 mg/dL — ABNORMAL HIGH (ref 65–99)
Glucose-Capillary: 134 mg/dL — ABNORMAL HIGH (ref 65–99)

## 2017-03-29 LAB — RENAL FUNCTION PANEL
ALBUMIN: 2.4 g/dL — AB (ref 3.5–5.0)
ANION GAP: 9 (ref 5–15)
BUN: 17 mg/dL (ref 6–20)
CO2: 19 mmol/L — ABNORMAL LOW (ref 22–32)
Calcium: 8.2 mg/dL — ABNORMAL LOW (ref 8.9–10.3)
Chloride: 111 mmol/L (ref 101–111)
Creatinine, Ser: 2.16 mg/dL — ABNORMAL HIGH (ref 0.44–1.00)
GFR calc non Af Amer: 21 mL/min — ABNORMAL LOW (ref >60)
GFR, EST AFRICAN AMERICAN: 25 mL/min — AB (ref >60)
Glucose, Bld: 143 mg/dL — ABNORMAL HIGH (ref 65–99)
PHOSPHORUS: 3.2 mg/dL (ref 2.5–4.6)
POTASSIUM: 3.3 mmol/L — AB (ref 3.5–5.1)
Sodium: 139 mmol/L (ref 135–145)

## 2017-03-29 MED ORDER — ZINC OXIDE 40 % EX OINT
TOPICAL_OINTMENT | Freq: Three times a day (TID) | CUTANEOUS | Status: DC | PRN
  Filled 2017-03-29: qty 114

## 2017-03-29 MED ORDER — POTASSIUM CHLORIDE CRYS ER 20 MEQ PO TBCR
40.0000 meq | EXTENDED_RELEASE_TABLET | ORAL | Status: AC
  Administered 2017-03-29 (×2): 40 meq via ORAL
  Filled 2017-03-29 (×2): qty 2

## 2017-03-29 NOTE — Progress Notes (Signed)
Patient ID: Ann Price, female   DOB: August 22, 1945, 72 y.o.   MRN: [id number]  [employer] PROGRESS NOTE  EMMANUELLA MIRANTE WUJ:[id number] DOB: 1945/10/26 DOA: 03/18/2017 PCP: Leotis Shames, MD  HPI/Subjective: Nausea less today.  Still has diarrhea she says she went around 4-5 times since yesterday.  No abdominal pain.  Lost IV access last night, IV fluids are stopped because of that.  Patient states that her nausea is better and she started to order solid food for the first time since hospitalization.  Objective: Vitals:   03/29/17 0430 03/29/17 0804  BP: (!) 151/94 (!) 152/58  Pulse: 65 63  Resp: 17 16  Temp: 98.8 F (37.1 C)   SpO2: 92% 96%    Filed Weights   03/25/17 0426 03/27/17 0557 03/29/17 0430  Weight: 94.3 kg (207 lb 12.8 oz) 96.8 kg (213 lb 8 oz) 97 kg (213 lb 14.4 oz)    ROS: Review of Systems  Constitutional: Negative for chills and fever.  Eyes: Negative for blurred vision.  Respiratory: Negative for cough and shortness of breath.   Cardiovascular: Negative for chest pain.  Gastrointestinal: Negative for abdominal pain, constipation, diarrhea, nausea and vomiting.  Genitourinary: Negative for dysuria.  Musculoskeletal: Negative for joint pain.  Neurological: Negative for dizziness and headaches.   Exam: Physical Exam  Constitutional: She is oriented to person, place, and time.  HENT:  Nose: No mucosal edema.  Mouth/Throat: No oropharyngeal exudate or posterior oropharyngeal edema.  Eyes: Conjunctivae, EOM and lids are normal. Pupils are equal, round, and reactive to light.  Neck: No JVD present. Carotid bruit is not present. No edema present. No thyroid mass and no thyromegaly present.  Cardiovascular: S1 normal and S2 normal. Exam reveals no gallop.  No murmur heard. Pulses:      Dorsalis pedis pulses are 2+ on the right side, and 2+ on the left side.  Respiratory: No respiratory distress. She has no decreased breath sounds. She has no wheezes. She  has no rhonchi. She has no rales.  GI: Soft. Bowel sounds are normal. There is no tenderness.  Musculoskeletal:       Right ankle: She exhibits swelling.       Left ankle: She exhibits swelling.  Lymphadenopathy:    She has no cervical adenopathy.  Neurological: She is alert and oriented to person, place, and time. No cranial nerve deficit.  Skin: Skin is warm. No rash noted. Nails show no clubbing.  Psychiatric: She has a normal mood and affect.      Data Reviewed: Basic Metabolic Panel: Recent Labs  Lab 03/24/17 1125 03/25/17 0439 03/27/17 0431 03/28/17 0515 03/29/17 0400  NA 134* 135 140 140 139  K 3.4* 3.6 3.2* 3.3* 3.3*  CL 104 106 114* 113* 111  CO2 17* 20* 17* 18* 19*  GLUCOSE 128* 121* 147* 178* 143*  BUN 43* 43* 31* 24* 17  CREATININE 3.50* 3.81* 2.98* 2.51* 2.16*  CALCIUM 7.8* 7.9* 8.0* 8.0* 8.2*  MG 2.4  --   --   --   --   PHOS  --   --   --   --  3.2   CBC: Recent Labs  Lab 03/24/17 0510 03/28/17 0526  WBC 6.4 8.8  HGB 9.5* 8.5*  HCT 29.3* 26.8*  MCV 66.5* 66.5*  PLT 214 382    CBG: Recent Labs  Lab 03/28/17 0756 03/28/17 1201 03/28/17 1700 03/28/17 2027 03/29/17 0803  GLUCAP 161* 200* 130* 123* 117*  Recent Results (from the past 240 hour(s))  Culture, blood (single) w Reflex to ID Panel     Status: None   Collection Time: 03/20/17  7:01 PM  Result Value Ref Range Status   Specimen Description BLOOD LEFT ANTECUBITAL  Final   Special Requests   Final    BOTTLES DRAWN AEROBIC AND ANAEROBIC Blood Culture adequate volume   Culture   Final    NO GROWTH 5 DAYS Performed at [facility], [address] [place], [place] [address]    Report Status 03/25/2017 FINAL  Final  Respiratory Panel by PCR     Status: Abnormal   Collection Time: 03/21/17  6:47 PM  Result Value Ref Range Status   Adenovirus DETECTED (A) NOT DETECTED Final   Coronavirus 229E NOT DETECTED NOT DETECTED Final   Coronavirus HKU1 NOT DETECTED NOT DETECTED  Final   Coronavirus NL63 NOT DETECTED NOT DETECTED Final   Coronavirus OC43 NOT DETECTED NOT DETECTED Final   Metapneumovirus NOT DETECTED NOT DETECTED Final   Rhinovirus / Enterovirus NOT DETECTED NOT DETECTED Final   Influenza A NOT DETECTED NOT DETECTED Final   Influenza B NOT DETECTED NOT DETECTED Final   Parainfluenza Virus 1 NOT DETECTED NOT DETECTED Final   Parainfluenza Virus 2 NOT DETECTED NOT DETECTED Final   Parainfluenza Virus 3 NOT DETECTED NOT DETECTED Final   Parainfluenza Virus 4 NOT DETECTED NOT DETECTED Final   Respiratory Syncytial Virus NOT DETECTED NOT DETECTED Final   Bordetella pertussis NOT DETECTED NOT DETECTED Final   Chlamydophila pneumoniae NOT DETECTED NOT DETECTED Final   Mycoplasma pneumoniae NOT DETECTED NOT DETECTED Final    Comment: Performed at [facility], [address], [place], [place] [address]  MRSA PCR Screening     Status: None   Collection Time: 03/21/17  9:35 PM  Result Value Ref Range Status   MRSA by PCR NEGATIVE NEGATIVE Final    Comment:        The GeneXpert MRSA Assay (FDA approved for NASAL specimens only), is one component of a comprehensive MRSA colonization surveillance program. It is not intended to diagnose MRSA infection nor to guide or monitor treatment for MRSA infections. Performed at [facility], [address] [place], [place] [address]      Scheduled Meds: . amLODipine  10 mg Oral Daily  . atenolol  50 mg Oral Daily  . atorvastatin  40 mg Oral q1800  . clopidogrel  75 mg Oral Daily  . heparin  5,000 Units Subcutaneous Q8H  . hydrALAZINE  25 mg Oral Q8H  . insulin aspart  0-5 Units Subcutaneous QHS  . insulin aspart  0-9 Units Subcutaneous TID WC  . metoCLOPramide (REGLAN) injection  5 mg Intravenous Q6H  . pantoprazole (PROTONIX) IV  40 mg Intravenous Q12H  . sodium chloride flush  3 mL Intravenous Q12H  . vancomycin  125 mg Oral Q6H   Continuous  Infusions:   Assessment/Plan:  1. Clinical sepsis likely secondary to C. difficile colitis.  On p.o. Vancomycin, clinically slowly improving but she feels really weak and has poor appetite likely due to C. difficile colitis.  Patient needs vancomycin for 8 more days.  Continue nausea medicines and encouraged p.o. intake. 2. acute hypoxic respiratory failure on oxygen supplementation.  Now on room air.  Clinically improving.  Patient finished 10 days of Rocephin.  Reviewed ID note.  Appreciate Dr. Sampson Goon seeing the patient.  No further chest pain ,shortness of breath or  no hypoxia. 3. Acute kidney injury with metabolic acidosis ;secondary to dehydration.  Slowly improving.  Hyponatremia improved with IV fluids.  Hold hydrochlorothiazide, losartan.  IV fluids discontinued secondary to poor IV access.  Encourage p.o. intake.  Discussed with nephrology. 4. Hypokalemia still low: Encourage p.o. supplements. 5. Type 2 diabetes mellitus on sliding scale insulin, hold metformin 6. Essential hypertension;continue atenolol, Norvasc, increased hydralazine 7. Hyperlipidemia unspecified on atorvastatin #8 nausea, possible diabetic gastroparesis.  Added Reglan IV, add IV PPI also.  Like her nausea is better today, continue to watch for another 24 hours and see how she tolerates regular food and discharge home tomorrow with home health. #9 right ankle pain: X-ray of the right ankle did not show any osteomyelitis.  Likely degenerative arthritis. Code Status:     Code Status Orders  (From admission, onward)        Start     Ordered   03/18/17 2228  Full code  Continuous     03/18/17 2227    Code Status History    Date Active Date Inactive Code Status Order ID Comments User Context   06/21/2016 15:52 06/22/2016 17:05 Full Code [id number]  Houston SirenSainani, Vivek J, MD ED     Disposition Plan: Kidney function will need to be better and patient will need to breathe better prior to  disposition  Consultants:  Nephrology  Infectious disease  Antibiotics:  P.o. vancomycin and Rocephin  Time spent: 28 minutes  Denzil Mceachron [employer]  [employer]

## 2017-03-29 NOTE — Progress Notes (Signed)
[facility]  ROUNDING NOTE   Subjective:   Sitting up in bed. Off IVF.  Diarrhea is less today. Coughing less  Eating more.   Creatinine 2.16 (2.51) (2.98)  Objective:  Vital signs in last 24 hours:  Temp:  [98.3 F (36.8 C)-98.8 F (37.1 C)] 98.8 F (37.1 C) (03/03 0430) Pulse Rate:  [63-70] 63 (03/03 0804) Resp:  [16-18] 16 (03/03 0804) BP: (131-152)/(48-94) 152/58 (03/03 0804) SpO2:  [92 %-97 %] 96 % (03/03 0804) Weight:  [97 kg (213 lb 14.4 oz)] 97 kg (213 lb 14.4 oz) (03/03 0430)  Weight change:  Filed Weights   03/25/17 0426 03/27/17 0557 03/29/17 0430  Weight: 94.3 kg (207 lb 12.8 oz) 96.8 kg (213 lb 8 oz) 97 kg (213 lb 14.4 oz)    Intake/Output: I/O last 3 completed shifts: In: -  Out: 951 [Urine:950; Stool:1]   Intake/Output this shift:  Total I/O In: -  Out: 100 [Urine:100]  Physical Exam: General: NAD,   Head: Normocephalic, atraumatic. Moist oral mucosal membranes  Eyes: Anicteric, PERRL  Neck: Supple, trachea midline  Lungs:  Clear to auscultation  Heart: Regular rate and rhythm  Abdomen:  Soft, nontender,   Extremities:  no peripheral edema.  Neurologic: Nonfocal, moving all four extremities  Skin: No lesions        Basic Metabolic Panel: Recent Labs  Lab 03/24/17 1125 03/25/17 0439 03/27/17 0431 03/28/17 0515 03/29/17 0400  NA 134* 135 140 140 139  K 3.4* 3.6 3.2* 3.3* 3.3*  CL 104 106 114* 113* 111  CO2 17* 20* 17* 18* 19*  GLUCOSE 128* 121* 147* 178* 143*  BUN 43* 43* 31* 24* 17  CREATININE 3.50* 3.81* 2.98* 2.51* 2.16*  CALCIUM 7.8* 7.9* 8.0* 8.0* 8.2*  MG 2.4  --   --   --   --   PHOS  --   --   --   --  3.2    Liver Function Tests: Recent Labs  Lab 03/29/17 0400  ALBUMIN 2.4*   No results for input(s): LIPASE, AMYLASE in the last 168 hours. No results for input(s): AMMONIA in the last 168 hours.  CBC: Recent Labs  Lab 03/24/17 0510 03/28/17 0526  WBC 6.4 8.8  HGB 9.5* 8.5*  HCT 29.3* 26.8*   MCV 66.5* 66.5*  PLT 214 382    Cardiac Enzymes: No results for input(s): CKTOTAL, CKMB, CKMBINDEX, TROPONINI in the last 168 hours.  BNP: Invalid input(s): POCBNP  CBG: Recent Labs  Lab 03/28/17 0756 03/28/17 1201 03/28/17 1700 03/28/17 2027 03/29/17 0803  GLUCAP 161* 200* 130* 123* 117*    Microbiology: Results for orders placed or performed during the hospital encounter of 03/18/17  Culture, blood (routine x 2)     Status: None   Collection Time: 03/18/17  7:55 PM  Result Value Ref Range Status   Specimen Description BLOOD RFOA  Final   Special Requests   Final    BOTTLES DRAWN AEROBIC AND ANAEROBIC Blood Culture adequate volume   Culture   Final    NO GROWTH 5 DAYS Performed at [facility], [address] [place], [place] [id number]    Report Status 03/23/2017 FINAL  Final  Culture, blood (routine x 2)     Status: None   Collection Time: 03/18/17  7:55 PM  Result Value Ref Range Status   Specimen Description BLOOD LFOA  Final   Special Requests   Final    BOTTLES DRAWN AEROBIC AND ANAEROBIC  Blood Culture adequate volume   Culture   Final    NO GROWTH 5 DAYS Performed at [facility], [address] [place], [place] [address]    Report Status 03/23/2017 FINAL  Final  Urine culture     Status: None   Collection Time: 03/18/17  7:55 PM  Result Value Ref Range Status   Specimen Description   Final    URINE, RANDOM Performed at [facility], [address] [place], [place] [address]    Special Requests   Final    NONE Performed at [facility], [address] [place], [place] [address]    Culture   Final    NO GROWTH Performed at [facility], [address] [place], [place] [address]    Report Status 03/20/2017 FINAL  Final  C difficile quick scan w PCR reflex     Status: Abnormal   Collection Time: 03/18/17 10:38 PM  Result Value Ref Range Status   C Diff antigen POSITIVE (A) NEGATIVE Final   C Diff  toxin NEGATIVE NEGATIVE Final   C Diff interpretation Results are indeterminate. See PCR results.  Final    Comment: Performed at [facility], [address] [place], [place] [address]  Gastrointestinal Panel by PCR , Stool     Status: None   Collection Time: 03/18/17 10:38 PM  Result Value Ref Range Status   Campylobacter species NOT DETECTED NOT DETECTED Final   Plesimonas shigelloides NOT DETECTED NOT DETECTED Final   Salmonella species NOT DETECTED NOT DETECTED Final   Yersinia enterocolitica NOT DETECTED NOT DETECTED Final   Vibrio species NOT DETECTED NOT DETECTED Final   Vibrio cholerae NOT DETECTED NOT DETECTED Final   Enteroaggregative E coli (EAEC) NOT DETECTED NOT DETECTED Final   Enteropathogenic E coli (EPEC) NOT DETECTED NOT DETECTED Final   Enterotoxigenic E coli (ETEC) NOT DETECTED NOT DETECTED Final   Shiga like toxin producing E coli (STEC) NOT DETECTED NOT DETECTED Final   Shigella/Enteroinvasive E coli (EIEC) NOT DETECTED NOT DETECTED Final   Cryptosporidium NOT DETECTED NOT DETECTED Final   Cyclospora cayetanensis NOT DETECTED NOT DETECTED Final   Entamoeba histolytica NOT DETECTED NOT DETECTED Final   Giardia lamblia NOT DETECTED NOT DETECTED Final   Adenovirus F40/41 NOT DETECTED NOT DETECTED Final   Astrovirus NOT DETECTED NOT DETECTED Final   Norovirus GI/GII NOT DETECTED NOT DETECTED Final   Rotavirus A NOT DETECTED NOT DETECTED Final   Sapovirus (I, II, IV, and V) NOT DETECTED NOT DETECTED Final    Comment: Performed at [facility], [address] [place], [place] [address]  C. Diff by PCR, Reflexed     Status: Abnormal   Collection Time: 03/18/17 10:38 PM  Result Value Ref Range Status   Toxigenic C. Difficile by PCR POSITIVE (A) NEGATIVE Final    Comment: Positive for toxigenic C. difficile with little to no toxin production. Only treat if clinical presentation suggests symptomatic illness. Performed at [facility], [address] [place], [place] [address]   Culture, blood (single) w Reflex to ID Panel     Status: None   Collection Time: 03/20/17  7:01 PM  Result Value Ref Range Status   Specimen Description BLOOD LEFT ANTECUBITAL  Final   Special Requests   Final    BOTTLES DRAWN AEROBIC AND ANAEROBIC Blood Culture adequate volume   Culture   Final    NO GROWTH 5 DAYS Performed at [facility], [address] [place], [place] [address]    Report Status 03/25/2017 FINAL  Final  Respiratory Panel by PCR     Status: Abnormal   Collection Time: 03/21/17  6:47 PM  Result Value Ref Range Status   Adenovirus DETECTED (A) NOT DETECTED Final   Coronavirus 229E NOT DETECTED NOT DETECTED Final   Coronavirus HKU1 NOT DETECTED NOT DETECTED Final   Coronavirus NL63 NOT DETECTED NOT DETECTED Final   Coronavirus OC43 NOT DETECTED NOT DETECTED Final   Metapneumovirus NOT DETECTED NOT DETECTED Final   Rhinovirus / Enterovirus NOT DETECTED NOT DETECTED Final   Influenza A NOT DETECTED NOT DETECTED Final   Influenza B NOT DETECTED NOT DETECTED Final   Parainfluenza Virus 1 NOT DETECTED NOT DETECTED Final   Parainfluenza Virus 2 NOT DETECTED NOT DETECTED Final   Parainfluenza Virus 3 NOT DETECTED NOT DETECTED Final   Parainfluenza Virus 4 NOT DETECTED NOT DETECTED Final   Respiratory Syncytial Virus NOT DETECTED NOT DETECTED Final   Bordetella pertussis NOT DETECTED NOT DETECTED Final   Chlamydophila pneumoniae NOT DETECTED NOT DETECTED Final   Mycoplasma pneumoniae NOT DETECTED NOT DETECTED Final    Comment: Performed at [facility], [address], [place], [place] [address]  MRSA PCR Screening     Status: None   Collection Time: 03/21/17  9:35 PM  Result Value Ref Range Status   MRSA by PCR NEGATIVE NEGATIVE Final    Comment:        The GeneXpert MRSA Assay (FDA approved for NASAL specimens only), is one component of a comprehensive MRSA colonization surveillance program. It is  not intended to diagnose MRSA infection nor to guide or monitor treatment for MRSA infections. Performed at [facility], [address] [place], [place] [address]     Coagulation Studies: No results for input(s): LABPROT, INR in the last 72 hours.  Urinalysis: No results for input(s): COLORURINE, LABSPEC, PHURINE, GLUCOSEU, HGBUR, BILIRUBINUR, KETONESUR, PROTEINUR, UROBILINOGEN, NITRITE, LEUKOCYTESUR in the last 72 hours.  Invalid input(s): APPERANCEUR    Imaging: Dg Ankle Complete Right  Result Date: 03/28/2017 CLINICAL DATA:  Diabetic ulcer.  Continued right ankle pain. EXAM: RIGHT ANKLE - COMPLETE 3+ VIEW COMPARISON:  None. FINDINGS: No fracture or dislocation. No soft tissue gas identified. No bony erosions are noted. IMPRESSION: No evidence of osteomyelitis on this study. Electronically Signed   By: Gerome Sam III M.D   On: 03/28/2017 10:36     Medications:    . amLODipine  10 mg Oral Daily  . atenolol  50 mg Oral Daily  . atorvastatin  40 mg Oral q1800  . clopidogrel  75 mg Oral Daily  . heparin  5,000 Units Subcutaneous Q8H  . hydrALAZINE  25 mg Oral Q8H  . insulin aspart  0-5 Units Subcutaneous QHS  . insulin aspart  0-9 Units Subcutaneous TID WC  . metoCLOPramide (REGLAN) injection  5 mg Intravenous Q6H  . pantoprazole (PROTONIX) IV  40 mg Intravenous Q12H  . sodium chloride flush  3 mL Intravenous Q12H  . vancomycin  125 mg Oral Q6H   acetaminophen **OR** acetaminophen, calcium carbonate, diphenhydrAMINE, guaiFENesin-dextromethorphan, ipratropium-albuterol, liver oil-zinc oxide, ondansetron **OR** ondansetron (ZOFRAN) IV, oxyCODONE-acetaminophen, temazepam  Assessment/ Plan:  Ms. Ann Price is a 72 y.o. black female with CVA, hypertension, hyperlipidemia, diabetes mellitus type II, who was admitted to [facility] on 03/18/2017 for Community acquired pneumonia, unspecified laterality [J18.9]  1. Acute renal failure with hyponatremia, metabolic  acidosis and hypokalemia on chronic kidney disease stage III with hematuria and proteinuria: baseline creatinine of 1.3, GFR of 49 on 03/17/17 Chronic kidney disease secondary to diabetes and hypertension Acute renal failure seems to be prerenal azotemia from poor PO intake, colitis and pneumonia - Holding IV fluids. Encourage PO intake - holding hydrochlorothiazide and losartan.    2. Hypertension: blood pressure improved. Home regimen of amlodipine, atenolol, hydralazine, hydrochlorothiazide and losartan - Continue hydralazine atenolol and amlodipine. Holding hydrochlorothiazide and losartan.   3. Diabetes mellitus type II with chronic kidney disease: insulin dependent. Holding metformin - Continue glucose control.   4. Pneumonia: completed course of ceftriaxone. PCR positive for adenovirus.   5. Colitis - PO vancomycin.   Appreciate ID input   LOS: 11 Starkisha Tullis 3/3/20199:39 AM

## 2017-03-29 NOTE — Plan of Care (Signed)
Pt is A&Ox4. VSS . RA . OOB with assist. Droplet and enteric isolation precautions maintained. NSR on monitor. PO antibiotics continued per orders. No complaints thus far. Will continue to monitor and report to oncoming RN .  Progressing Education: Knowledge of General Education information will improve 03/29/2017 1509 - Progressing by Jodie EchevariaWhite, Vernecia Umble L, RN Health Behavior/Discharge Planning: Ability to manage health-related needs will improve 03/29/2017 1509 - Progressing by Jodie EchevariaWhite, Clementina Mareno L, RN Clinical Measurements: Ability to maintain clinical measurements within normal limits will improve 03/29/2017 1509 - Progressing by Jodie EchevariaWhite, Markanthony Gedney L, RN Will remain free from infection 03/29/2017 1509 - Progressing by Jodie EchevariaWhite, Laquinn Shippy L, RN Diagnostic test results will improve 03/29/2017 1509 - Progressing by Jodie EchevariaWhite, Devanny Palecek L, RN Respiratory complications will improve 03/29/2017 1509 - Progressing by Jodie EchevariaWhite, Toa Mia L, RN Cardiovascular complication will be avoided 03/29/2017 1509 - Progressing by Jodie EchevariaWhite, Mortimer Bair L, RN Activity: Risk for activity intolerance will decrease 03/29/2017 1509 - Progressing by Jodie EchevariaWhite, Lynisha Osuch L, RN Nutrition: Adequate nutrition will be maintained 03/29/2017 1509 - Progressing by Jodie EchevariaWhite, Jaymon Dudek L, RN Coping: Level of anxiety will decrease 03/29/2017 1509 - Progressing by Jodie EchevariaWhite, Antionne Enrique L, RN Elimination: Will not experience complications related to bowel motility 03/29/2017 1509 - Progressing by Jodie EchevariaWhite, Ajahni Nay L, RN Will not experience complications related to urinary retention 03/29/2017 1509 - Progressing by Jodie EchevariaWhite, Choice Kleinsasser L, RN Pain Managment: General experience of comfort will improve 03/29/2017 1509 - Progressing by Jodie EchevariaWhite, Delicia Berens L, RN Safety: Ability to remain free from injury will improve 03/29/2017 1509 - Progressing by Jodie EchevariaWhite, Elson Ulbrich L, RN Skin Integrity: Risk for impaired skin integrity will decrease 03/29/2017 1509 - Progressing by Jodie EchevariaWhite, Nura Cahoon L, RN Fluid Volume: Hemodynamic stability will improve 03/29/2017 1509 -  Progressing by Jodie EchevariaWhite, Cutter Passey L, RN Clinical Measurements: Signs and symptoms of infection will decrease 03/29/2017 1509 - Progressing by Jodie EchevariaWhite, Ciro Tashiro L, RN Respiratory: Ability to maintain adequate ventilation will improve 03/29/2017 1509 - Progressing by Jodie EchevariaWhite, Woodroe Vogan L, RN Ability to maintain a clear airway will improve 03/29/2017 1509 - Progressing by Jodie EchevariaWhite, Christophor Eick L, RN

## 2017-03-30 LAB — GLUCOSE, CAPILLARY
GLUCOSE-CAPILLARY: 128 mg/dL — AB (ref 65–99)
GLUCOSE-CAPILLARY: 171 mg/dL — AB (ref 65–99)

## 2017-03-30 MED ORDER — OMEPRAZOLE 40 MG PO CPDR: 40.0000 mg | DELAYED_RELEASE_CAPSULE | Freq: Every day | ORAL | 1 refills | Status: AC

## 2017-03-30 MED ORDER — INSULIN ASPART 100 UNIT/ML ~~LOC~~ SOLN: 0.0000 [IU] | Freq: Every day | SUBCUTANEOUS | 11 refills | Status: AC

## 2017-03-30 MED ORDER — INSULIN ASPART 100 UNIT/ML ~~LOC~~ SOLN: 0.0000 [IU] | Freq: Three times a day (TID) | SUBCUTANEOUS | 11 refills | Status: AC

## 2017-03-30 MED ORDER — METOCLOPRAMIDE HCL 5 MG PO TABS: 5.0000 mg | ORAL_TABLET | Freq: Three times a day (TID) | ORAL | 1 refills | Status: AC

## 2017-03-30 MED ORDER — VANCOMYCIN 50 MG/ML ORAL SOLUTION: 125.0000 mg | Freq: Four times a day (QID) | ORAL | 0 refills | Status: AC

## 2017-03-30 NOTE — Progress Notes (Signed)
[facility]  ROUNDING NOTE   Subjective:  Renal function does continue to slowly improve. Creatinine down to 2.16.   Objective:  Vital signs in last 24 hours:  Temp:  [98.3 F (36.8 C)-98.6 F (37 C)] 98.4 F (36.9 C) (03/04 0740) Pulse Rate:  [64-68] 68 (03/04 0740) BP: (146-170)/(42-68) 150/58 (03/04 0740) SpO2:  [96 %-100 %] 96 % (03/04 0740) Weight:  [96 kg (211 lb 11.2 oz)] 96 kg (211 lb 11.2 oz) (03/04 0337)  Weight change: -0.998 kg (-3.2 oz) Filed Weights   03/27/17 0557 03/29/17 0430 03/30/17 0337  Weight: 96.8 kg (213 lb 8 oz) 97 kg (213 lb 14.4 oz) 96 kg (211 lb 11.2 oz)    Intake/Output: I/O last 3 completed shifts: In: -  Out: 1150 [Urine:1150]   Intake/Output this shift:  Total I/O In: -  Out: 200 [Urine:200]  Physical Exam: General: NAD  Head: Normocephalic, atraumatic. Moist oral mucosal membranes  Eyes: Anicteric  Neck: Supple, trachea midline  Lungs:  Clear to auscultation bilateral, normal effort  Heart: Regular rate and rhythm  Abdomen:  Soft, nontender, BS present   Extremities:  no peripheral edema.  Neurologic: Nonfocal, moving all four extremities  Skin: No lesions        Basic Metabolic Panel: Recent Labs  Lab 03/24/17 1125 03/25/17 0439 03/27/17 0431 03/28/17 0515 03/29/17 0400  NA 134* 135 140 140 139  K 3.4* 3.6 3.2* 3.3* 3.3*  CL 104 106 114* 113* 111  CO2 17* 20* 17* 18* 19*  GLUCOSE 128* 121* 147* 178* 143*  BUN 43* 43* 31* 24* 17  CREATININE 3.50* 3.81* 2.98* 2.51* 2.16*  CALCIUM 7.8* 7.9* 8.0* 8.0* 8.2*  MG 2.4  --   --   --   --   PHOS  --   --   --   --  3.2    Liver Function Tests: Recent Labs  Lab 03/29/17 0400  ALBUMIN 2.4*   No results for input(s): LIPASE, AMYLASE in the last 168 hours. No results for input(s): AMMONIA in the last 168 hours.  CBC: Recent Labs  Lab 03/24/17 0510 03/28/17 0526  WBC 6.4 8.8  HGB 9.5* 8.5*  HCT 29.3* 26.8*  MCV 66.5* 66.5*  PLT 214 382     Cardiac Enzymes: No results for input(s): CKTOTAL, CKMB, CKMBINDEX, TROPONINI in the last 168 hours.  BNP: Invalid input(s): POCBNP  CBG: Recent Labs  Lab 03/29/17 0803 03/29/17 1213 03/29/17 1644 03/29/17 2026 03/30/17 0741  GLUCAP 117* 151* 176* 134* 128*    Microbiology: Results for orders placed or performed during the hospital encounter of 03/18/17  Culture, blood (routine x 2)     Status: None   Collection Time: 03/18/17  7:55 PM  Result Value Ref Range Status   Specimen Description BLOOD RFOA  Final   Special Requests   Final    BOTTLES DRAWN AEROBIC AND ANAEROBIC Blood Culture adequate volume   Culture   Final    NO GROWTH 5 DAYS Performed at [facility], [address], [place], [place] [address]    Report Status 03/23/2017 FINAL  Final  Culture, blood (routine x 2)     Status: None   Collection Time: 03/18/17  7:55 PM  Result Value Ref Range Status   Specimen Description BLOOD LFOA  Final   Special Requests   Final    BOTTLES DRAWN AEROBIC AND ANAEROBIC Blood Culture adequate volume   Culture   Final  NO GROWTH 5 DAYS Performed at [facility], [address] [place] [place], [place] [address]    Report Status 03/23/2017 FINAL  Final  Urine culture     Status: None   Collection Time: 03/18/17  7:55 PM  Result Value Ref Range Status   Specimen Description   Final    URINE, RANDOM Performed at [facility], [address], [place], [place] [address]    Special Requests   Final    NONE Performed at [facility], [address], [place], [place] [address]    Culture   Final    NO GROWTH Performed at [facility], [address], [place], [place] [address]    Report Status 03/20/2017 FINAL  Final  C difficile quick scan w PCR reflex     Status: Abnormal   Collection Time: 03/18/17 10:38 PM  Result Value Ref Range Status   C Diff antigen POSITIVE (A) NEGATIVE Final   C Diff toxin NEGATIVE NEGATIVE Final    C Diff interpretation Results are indeterminate. See PCR results.  Final    Comment: Performed at [facility], [address] [place], [place] [address]  Gastrointestinal Panel by PCR , Stool     Status: None   Collection Time: 03/18/17 10:38 PM  Result Value Ref Range Status   Campylobacter species NOT DETECTED NOT DETECTED Final   Plesimonas shigelloides NOT DETECTED NOT DETECTED Final   Salmonella species NOT DETECTED NOT DETECTED Final   Yersinia enterocolitica NOT DETECTED NOT DETECTED Final   Vibrio species NOT DETECTED NOT DETECTED Final   Vibrio cholerae NOT DETECTED NOT DETECTED Final   Enteroaggregative E coli (EAEC) NOT DETECTED NOT DETECTED Final   Enteropathogenic E coli (EPEC) NOT DETECTED NOT DETECTED Final   Enterotoxigenic E coli (ETEC) NOT DETECTED NOT DETECTED Final   Shiga like toxin producing E coli (STEC) NOT DETECTED NOT DETECTED Final   Shigella/Enteroinvasive E coli (EIEC) NOT DETECTED NOT DETECTED Final   Cryptosporidium NOT DETECTED NOT DETECTED Final   Cyclospora cayetanensis NOT DETECTED NOT DETECTED Final   Entamoeba histolytica NOT DETECTED NOT DETECTED Final   Giardia lamblia NOT DETECTED NOT DETECTED Final   Adenovirus F40/41 NOT DETECTED NOT DETECTED Final   Astrovirus NOT DETECTED NOT DETECTED Final   Norovirus GI/GII NOT DETECTED NOT DETECTED Final   Rotavirus A NOT DETECTED NOT DETECTED Final   Sapovirus (I, II, IV, and V) NOT DETECTED NOT DETECTED Final    Comment: Performed at [facility], [address] [place], [place] [address]  C. Diff by PCR, Reflexed     Status: Abnormal   Collection Time: 03/18/17 10:38 PM  Result Value Ref Range Status   Toxigenic C. Difficile by PCR POSITIVE (A) NEGATIVE Final    Comment: Positive for toxigenic C. difficile with little to no toxin production. Only treat if clinical presentation suggests symptomatic illness. Performed at [facility], [address] [place],  [place] [address]   Culture, blood (single) w Reflex to ID Panel     Status: None   Collection Time: 03/20/17  7:01 PM  Result Value Ref Range Status   Specimen Description BLOOD LEFT ANTECUBITAL  Final   Special Requests   Final    BOTTLES DRAWN AEROBIC AND ANAEROBIC Blood Culture adequate volume   Culture   Final    NO GROWTH 5 DAYS Performed at [facility], [address], [place], [place] [address]    Report  Status 03/25/2017 FINAL  Final  Respiratory Panel by PCR     Status: Abnormal   Collection Time: 03/21/17  6:47 PM  Result Value Ref Range Status   Adenovirus DETECTED (A) NOT DETECTED Final   Coronavirus 229E NOT DETECTED NOT DETECTED Final   Coronavirus HKU1 NOT DETECTED NOT DETECTED Final   Coronavirus NL63 NOT DETECTED NOT DETECTED Final   Coronavirus OC43 NOT DETECTED NOT DETECTED Final   Metapneumovirus NOT DETECTED NOT DETECTED Final   Rhinovirus / Enterovirus NOT DETECTED NOT DETECTED Final   Influenza A NOT DETECTED NOT DETECTED Final   Influenza B NOT DETECTED NOT DETECTED Final   Parainfluenza Virus 1 NOT DETECTED NOT DETECTED Final   Parainfluenza Virus 2 NOT DETECTED NOT DETECTED Final   Parainfluenza Virus 3 NOT DETECTED NOT DETECTED Final   Parainfluenza Virus 4 NOT DETECTED NOT DETECTED Final   Respiratory Syncytial Virus NOT DETECTED NOT DETECTED Final   Bordetella pertussis NOT DETECTED NOT DETECTED Final   Chlamydophila pneumoniae NOT DETECTED NOT DETECTED Final   Mycoplasma pneumoniae NOT DETECTED NOT DETECTED Final    Comment: Performed at [facility], [address], [place], [place] [address]  MRSA PCR Screening     Status: None   Collection Time: 03/21/17  9:35 PM  Result Value Ref Range Status   MRSA by PCR NEGATIVE NEGATIVE Final    Comment:        The GeneXpert MRSA Assay (FDA approved for NASAL specimens only), is one component of a comprehensive MRSA colonization surveillance program. It is not intended to diagnose  MRSA infection nor to guide or monitor treatment for MRSA infections. Performed at [facility], [address] [place], [place] [address]     Coagulation Studies: No results for input(s): LABPROT, INR in the last 72 hours.  Urinalysis: No results for input(s): COLORURINE, LABSPEC, PHURINE, GLUCOSEU, HGBUR, BILIRUBINUR, KETONESUR, PROTEINUR, UROBILINOGEN, NITRITE, LEUKOCYTESUR in the last 72 hours.  Invalid input(s): APPERANCEUR    Imaging: No results found.   Medications:    . amLODipine  10 mg Oral Daily  . atenolol  50 mg Oral Daily  . atorvastatin  40 mg Oral q1800  . clopidogrel  75 mg Oral Daily  . heparin  5,000 Units Subcutaneous Q8H  . hydrALAZINE  25 mg Oral Q8H  . insulin aspart  0-5 Units Subcutaneous QHS  . insulin aspart  0-9 Units Subcutaneous TID WC  . metoCLOPramide (REGLAN) injection  5 mg Intravenous Q6H  . pantoprazole (PROTONIX) IV  40 mg Intravenous Q12H  . sodium chloride flush  3 mL Intravenous Q12H  . vancomycin  125 mg Oral Q6H   acetaminophen **OR** acetaminophen, calcium carbonate, diphenhydrAMINE, guaiFENesin-dextromethorphan, ipratropium-albuterol, liver oil-zinc oxide, ondansetron **OR** ondansetron (ZOFRAN) IV, oxyCODONE-acetaminophen, temazepam  Assessment/ Plan:  Ann Price is a 72 y.o. black female with CVA, hypertension, hyperlipidemia, diabetes mellitus type II, who was admitted to [facility] on 03/18/2017 for Community acquired pneumonia, unspecified laterality [J18.9]  1. Acute renal failure with hyponatremia, metabolic acidosis and hypokalemia on chronic kidney disease stage III with hematuria and proteinuria: baseline creatinine of 1.3, GFR of 49 on 03/17/17 Chronic kidney disease secondary to diabetes and hypertension Acute renal failure seems to be prerenal azotemia from poor PO intake, colitis and pneumonia -Creatinine continues to trend down.  Currently 2.16.  Patient currently off of IV fluids.  Monitor renal  function daily for now.  Continue to hold HCTZ as well as losartan.  2. Hypertension: blood pressure improved. Home regimen of amlodipine, atenolol, hydralazine, hydrochlorothiazide and losartan - Continue hydralazine atenolol and amlodipine.   3. Diabetes mellitus type II with chronic kidney disease: insulin dependent. Holding metformin - Continue glucose control as per hospitalist.   4. Pneumonia: completed course of ceftriaxone. PCR positive for adenovirus.   5. Colitis - Maintain the patient on PO vancomycin.  Appreciate ID input   LOS: 12 Eulamae Greenstein 3/4/201910:58 AM

## 2017-03-30 NOTE — Progress Notes (Signed)
Discharge instructions reviewed with pt. Pt verbalizes understanding. Pt ready for discharge home. 

## 2017-03-30 NOTE — Care Management Important Message (Signed)
Important Message  Patient Details  Name: Ann Price MRN: [id number] Date of Birth: 07/22/1945   Medicare Important Message Given:  Yes Signed IM notice given   Eber HongGreene, Dominika Losey R, RN 03/30/2017, 10:19 AM

## 2017-03-30 NOTE — Care Management (Signed)
Patient for discharge home today.  she is in agreement with home health referral- RN PT Aide.  Agency preference is [facility].  Referral called and accepted.  Patient has a cane at home.  She will be provided with her Vancomycin suspension.

## 2017-03-30 NOTE — Discharge Summary (Signed)
Ann Price, is a 72 y.o. female  DOB Feb 03, 1945  MRN [id number].  Admission date:  03/18/2017  Admitting Physician  Oralia Manis, MD  Discharge Date:  03/30/2017   Primary MD  Leotis Shames, MD  Recommendations for primary care physician for things to follow:  Follow-up with PCP in 1 week Follow-up with Dr. Wynelle Link in 10 days   Admission Diagnosis  Community acquired pneumonia, unspecified laterality [J18.9]   Discharge Diagnosis  Community acquired pneumonia, unspecified laterality [J18.9]    Principal Problem:   Sepsis (HCC) Active Problems:   CAP (community acquired pneumonia)   Diabetes (HCC)   HTN (hypertension)   HLD (hyperlipidemia)   AKI (acute kidney injury) (HCC)      Past Medical History:  Diagnosis Date  . Arthritis    knees  . Diabetes mellitus without complication (HCC)   . GERD (gastroesophageal reflux disease)   . Hyperlipidemia   . Hypertension   . Stroke (HCC) 2016   (TIA) no deficits  . Wears dentures    full upper, partial lower    Past Surgical History:  Procedure Laterality Date  . CATARACT EXTRACTION W/PHACO Right 03/25/2016   Procedure: CATARACT EXTRACTION PHACO AND INTRAOCULAR LENS PLACEMENT (IOC)  right diabetic;  Surgeon: Nevada Crane, MD;  Location: [facility];  Service: Ophthalmology;  Laterality: Right;  diabetic - oral meds  . CATARACT EXTRACTION W/PHACO Left 06/03/2016   Procedure: CATARACT EXTRACTION PHACO AND INTRAOCULAR LENS PLACEMENT (IOC);  Surgeon: Nevada Crane, MD;  Location: [facility];  Service: Ophthalmology;  Laterality: Left;  LEFT DIABETES - oral meds  . COLONOSCOPY    . KNEE ARTHROSCOPY Right 03/22/2015   Procedure: ARTHROSCOPY KNEE, PARTIAL MEDIAL MENISECTOMY;  Surgeon: Kennedy Bucker, MD;  Location: [facility];  Service:  Orthopedics;  Laterality: Right;  . TUBAL LIGATION    . UPPER GI ENDOSCOPY    . VAGINAL HYSTERECTOMY         History of present illness and  Hospital Course:     Kindly see H&P for history of present illness and admission details, please review complete Labs, Consult reports and Test reports for all details in brief  HPI  from the history and physical done on the day of admission 72 year old female patient with history of diabetes mellitus type 2, essential hypertension admitted on 20 January for fever, generalized body aches associated with cough and productive phlegm.   Hospital Course  #1 sepsis present on admission secondary to community-acquired pneumonia on the left side.  Patient flu test has been negative.  On admission temperature 103 Fahrenheit.  Elevated pro-calcitonin level.: Received Ceftin, Zithromax.  Patient finished on days of Rocephin in the hospital for pneumonia.  No further hypoxia, wheezing or cough. 2.  Continued to have high fever and malaise.  Patient had pneumonia and also probably no virus by respiratory PCR.  Patient treatment with Rocephin.  Dr. Sampson Goon discussed with health department regarding no virus. 3/Diarrhea patient developed diarrhea and found to have C. difficile colitis, seen by Dr. Sampson Goon, patient started on vancomycin, patient continued on vancomycin 250 mg every 6 hours and she will need to continue.daily March 11.  Patient diarrhea decreased, advised her to continue vancomycin. 3.  Acute renal failure hyponatremia, metabolic acidosis, hypokalemia on chronic kidney disease stage III: Patient baseline 1.3, GFR 49 on admission but patient continued to have worsening renal function with a creatinine up to 3.81 during the hospital stay.  Received gentle hydration, seen  by nephrology, he had been decreased and it is 2.16 today. 4.  Nausea, poor p.o. intake secondary to C. difficile colitis, patient started on Reglan, Protonix, her nausea improved,  she is tolerating the diet.  She also has less diarrhea.  So she can go home with Reglan, Protonix. 5.  Diabetes mellitus type 2: Metformin stopped in the hospital due to acute renal failure.  Patient needs to be on sliding scale with coverage only for now, we will arrange home health nursing reports mood transition and also given education about checking sugars and insulin.  Patient can follow-up with PCP in 1 week that time patient can have kidney function checked and see if she can be on the Lantus, sliding scale insulin or NovoLog. 6.  Acute hypoxic respiratory failure secondary to pneumonia: Improved.  Patient is on room air now at this time.  #7 hypokalemia secondary to diarrhea: Got potassium supplements.  Her p.o. intake is improving hopefully potassium will be better.    Discharge Condition:stable   Follow UP  Follow-up Information    Leotis Shames, MD. Schedule an appointment as soon as possible for a visit in 2 day(s).   Specialty:  Internal Medicine Contact information: [address] [facility] [place] [address] [phone]        Lamont Dowdy, MD. Schedule an appointment as soon as possible for a visit in 1 week(s).   Specialty:  Internal Medicine Contact information: [address] [place] [address] [phone]             Discharge Instructions  and  Discharge Medications     Discharge Instructions    Face-to-face encounter (required for Medicare/Medicaid patients)   Complete by:  As directed    I Katha Hamming certify that this patient is under my care and that I, or a nurse practitioner or physician's assistant working with me, had a face-to-face encounter that meets the physician face-to-face encounter requirements with this patient on 03/30/2017. The encounter with the patient was in whole, or in part for the following medical condition(s) which is the primary reason for home health care   The  encounter with the patient was in whole, or in part, for the following medical condition, which is the primary reason for home health care:  whole   I certify that, based on my findings, the following services are medically necessary home health services:  Physical therapy   Reason for Medically Necessary Home Health Services:  Therapy- Instruction on use of Assistive Device for Ambulation on all Surfaces   My clinical findings support the need for the above services:  Unsafe ambulation due to balance issues   Further, I certify that my clinical findings support that this patient is homebound due to:  Unable to leave home safely without assistance   Home Health   Complete by:  As directed    To provide the following care/treatments:   PT RN Home Health Aide       Allergies as of 03/30/2017      Reactions   Aspirin Itching   Penicillins Other (See Comments)   Stomach burns Has patient had a PCN reaction causing immediate rash, facial/tongue/throat swelling, SOB or lightheadedness with hypotension: No Has patient had a PCN reaction causing severe rash involving mucus membranes or skin necrosis: No Has patient had a PCN reaction that required hospitalization: No Has patient had a PCN reaction occurring within the last 10 years: No If all  of the above answers are "NO", then may proceed with Cephalosporin use.      Medication List    STOP taking these medications   hydrochlorothiazide 25 MG tablet Commonly known as:  HYDRODIURIL   LANTUS SOLOSTAR 100 UNIT/ML Solostar Pen Generic drug:  Insulin Glargine   losartan 100 MG tablet Commonly known as:  COZAAR   metFORMIN 1000 MG tablet Commonly known as:  GLUCOPHAGE     TAKE these medications   amLODipine 10 MG tablet Commonly known as:  NORVASC Take 10 mg by mouth daily.   atenolol 50 MG tablet Commonly known as:  TENORMIN Take 50 mg by mouth daily.   atorvastatin 40 MG tablet Commonly known as:  LIPITOR Take 1 tablet (40 mg  total) by mouth daily at 6 PM.   clopidogrel 75 MG tablet Commonly known as:  PLAVIX Take 1 tablet (75 mg total) by mouth daily.   hydrALAZINE 25 MG tablet Commonly known as:  APRESOLINE Take 25 mg by mouth 3 (three) times daily.   insulin aspart 100 UNIT/ML injection Commonly known as:  novoLOG Inject 0-5 Units into the skin at bedtime.   insulin aspart 100 UNIT/ML injection Commonly known as:  novoLOG Inject 0-9 Units into the skin 3 (three) times daily with meals.   metoCLOPramide 5 MG tablet Commonly known as:  REGLAN Take 1 tablet (5 mg total) by mouth 3 (three) times daily.   omeprazole 40 MG capsule Commonly known as:  PRILOSEC Take 1 capsule (40 mg total) by mouth daily.   vancomycin 50 mg/mL oral solution Commonly known as:  VANCOCIN Take 2.5 mLs (125 mg total) by mouth every 6 (six) hours.         Diet and Activity recommendation: See Discharge Instructions above   Consults obtained -ID, nephrology   Major procedures and Radiology Reports - PLEASE review detailed and final reports for all details, in brief -     Ct Abdomen Pelvis Wo Contrast  Result Date: 03/22/2017 CLINICAL DATA:  Abdominal pain. Gastroenteritis or colitis suspected. EXAM: CT ABDOMEN WITHOUT CONTRAST TECHNIQUE: Multidetector CT imaging of the abdomen was performed following the standard protocol without IV contrast. COMPARISON:  Two-view chest x-ray 03/18/2017 FINDINGS: Lower chest: Left lower lobe consolidated pneumonia is confirmed. Ill-defined airspace disease is present in the right lower lobe as well. The heart is mildly enlarged. No significant pleural or pericardial effusion is present. Hepatobiliary: No focal liver abnormality is seen. No gallstones, gallbladder wall thickening, or biliary dilatation. Pancreas: Unremarkable. No pancreatic ductal dilatation or surrounding inflammatory changes. Spleen: Normal in size without focal abnormality. Adrenals/Urinary Tract: Adrenal glands are  normal bilaterally. Kidneys and ureters are within normal limits. Stomach/Bowel: The stomach and duodenum are within normal limits. Fluid levels are present within nondilated loops of small bowel. There is no focal obstruction. Visualized colon is within normal limits. Vascular/Lymphatic: Vascular calcifications are present in the aorta and branch vessels without aneurysm. Other: No abdominal wall hernia or abnormality. Musculoskeletal: Slight anterolisthesis at L4-5 is degenerative. There is uncovering of a broad-based disc protrusion at this level. This results in mild foraminal narrowing bilaterally. Mild foraminal narrowing is also present at L3-4. Vertebral body heights are maintained. No focal lytic or blastic lesions are present. IMPRESSION: 1. Left lower lobe consolidated pneumonia. 2. Ill-defined airspace disease in the right lower lobe likely also represents infection. 3. Fluid levels within nondilated loops of small bowel may represent ileus. There is no focal obstruction. 4. Degenerative changes of the lumbar  spine with foraminal narrowing bilaterally at L3-4 and L4-5. Electronically Signed   By: Marin Roberts M.D.   On: 03/22/2017 20:13   Dg Chest 2 View  Result Date: 03/18/2017 CLINICAL DATA:  72 year old female with multiple days of generalized body aches, nausea, diarrhea and fever. EXAM: CHEST  2 VIEW COMPARISON:  Chest x-ray 04/18/2014. FINDINGS: Airspace consolidation in the left lower lobe, concerning for pneumonia. Right lung is clear. No pleural effusions. Vascular crowding, without frank pulmonary edema. No pneumothorax. Heart size is borderline enlarged. Upper mediastinal contours are within normal limits. IMPRESSION: 1. Left lower lobe airspace consolidation concerning for pneumonia. Electronically Signed   By: Trudie Reed M.D.   On: 03/18/2017 19:03   Dg Ankle Complete Right  Result Date: 03/28/2017 CLINICAL DATA:  Diabetic ulcer.  Continued right ankle pain. EXAM: RIGHT  ANKLE - COMPLETE 3+ VIEW COMPARISON:  None. FINDINGS: No fracture or dislocation. No soft tissue gas identified. No bony erosions are noted. IMPRESSION: No evidence of osteomyelitis on this study. Electronically Signed   By: Gerome Sam III M.D   On: 03/28/2017 10:36    Micro Results     Recent Results (from the past 240 hour(s))  Culture, blood (single) w Reflex to ID Panel     Status: None   Collection Time: 03/20/17  7:01 PM  Result Value Ref Range Status   Specimen Description BLOOD LEFT ANTECUBITAL  Final   Special Requests   Final    BOTTLES DRAWN AEROBIC AND ANAEROBIC Blood Culture adequate volume   Culture   Final    NO GROWTH 5 DAYS Performed at [facility], [address] [place], [place] [address]    Report Status 03/25/2017 FINAL  Final  Respiratory Panel by PCR     Status: Abnormal   Collection Time: 03/21/17  6:47 PM  Result Value Ref Range Status   Adenovirus DETECTED (A) NOT DETECTED Final   Coronavirus 229E NOT DETECTED NOT DETECTED Final   Coronavirus HKU1 NOT DETECTED NOT DETECTED Final   Coronavirus NL63 NOT DETECTED NOT DETECTED Final   Coronavirus OC43 NOT DETECTED NOT DETECTED Final   Metapneumovirus NOT DETECTED NOT DETECTED Final   Rhinovirus / Enterovirus NOT DETECTED NOT DETECTED Final   Influenza A NOT DETECTED NOT DETECTED Final   Influenza B NOT DETECTED NOT DETECTED Final   Parainfluenza Virus 1 NOT DETECTED NOT DETECTED Final   Parainfluenza Virus 2 NOT DETECTED NOT DETECTED Final   Parainfluenza Virus 3 NOT DETECTED NOT DETECTED Final   Parainfluenza Virus 4 NOT DETECTED NOT DETECTED Final   Respiratory Syncytial Virus NOT DETECTED NOT DETECTED Final   Bordetella pertussis NOT DETECTED NOT DETECTED Final   Chlamydophila pneumoniae NOT DETECTED NOT DETECTED Final   Mycoplasma pneumoniae NOT DETECTED NOT DETECTED Final    Comment: Performed at [facility], [address] [place], [place] [address]  MRSA PCR Screening      Status: None   Collection Time: 03/21/17  9:35 PM  Result Value Ref Range Status   MRSA by PCR NEGATIVE NEGATIVE Final    Comment:        The GeneXpert MRSA Assay (FDA approved for NASAL specimens only), is one component of a comprehensive MRSA colonization surveillance program. It is not intended to diagnose MRSA infection nor to guide or monitor treatment for MRSA infections. Performed at [facility], [address] [place], [place] [address]        Today   Subjective:  Amiel Sharrow today has no headache,no chest abdominal pain,no new weakness tingling or numbness, feels much better wants to go home today.  Objective:   Blood pressure (!) 150/58, pulse 68, temperature 98.4 F (36.9 C), temperature source Oral, resp. rate 16, height 5\' 3"  (1.6 m), weight 96 kg (211 lb 11.2 oz), SpO2 96 %.   Intake/Output Summary (Last 24 hours) at 03/30/2017 0957 Last data filed at 03/30/2017 0703 Gross per 24 hour  Intake -  Output 850 ml  Net -850 ml    Exam Awake Alert, Oriented x 3, No new F.N deficits, Normal affect [place].AT,PERRAL Supple Neck,No JVD, No cervical lymphadenopathy appriciated.  Symmetrical Chest wall movement, Good air movement bilaterally, CTAB RRR,No Gallops,Rubs or new Murmurs, No Parasternal Heave +ve B.Sounds, Abd Soft, Non tender, No organomegaly appriciated, No rebound -guarding or rigidity. No Cyanosis, Clubbing or edema, No new Rash or bruise  Data Review   CBC w Diff:  Lab Results  Component Value Date   WBC 8.8 03/28/2017   HGB 8.5 (L) 03/28/2017   HGB 12.2 04/21/2014   HCT 26.8 (L) 03/28/2017   HCT 39.2 04/21/2014   PLT 382 03/28/2017   PLT 259 04/21/2014   LYMPHOPCT 4 03/18/2017   LYMPHOPCT 18.9 04/21/2014   MONOPCT 6 03/18/2017   MONOPCT 4.4 04/21/2014   EOSPCT 0 03/18/2017   EOSPCT 0.5 04/21/2014   BASOPCT 1 03/18/2017   BASOPCT 0.4 04/21/2014    CMP:  Lab Results  Component Value Date   NA 139 03/29/2017   NA 138  04/21/2014   K 3.3 (L) 03/29/2017   K 4.1 04/21/2014   CL 111 03/29/2017   CL 104 04/21/2014   CO2 19 (L) 03/29/2017   CO2 26 04/21/2014   BUN 17 03/29/2017   BUN 30 (H) 04/21/2014   CREATININE 2.16 (H) 03/29/2017   CREATININE 1.20 (H) 04/21/2014   PROT 8.1 03/18/2017   ALBUMIN 2.4 (L) 03/29/2017   BILITOT 0.7 03/18/2017   ALKPHOS 65 03/18/2017   AST 54 (H) 03/18/2017   ALT 26 03/18/2017  .   Total Time in preparing paper work, data evaluation and todays exam - 35 minutes  Katha Hamming M.D on 03/30/2017 at 9:57 AM    Note: This dictation was prepared with Dragon dictation along with smaller phrase technology. Any transcriptional errors that result from this process are unintentional.

## 2017-03-30 NOTE — Plan of Care (Signed)
Continues to use bedside commode. Pt encouraged to be out of bed for meals and ambulate to bathroom with assistance.

## 2017-04-01 DIAGNOSIS — N183 Chronic kidney disease, stage 3 (moderate): Secondary | ICD-10-CM | POA: Diagnosis not present

## 2017-04-01 DIAGNOSIS — Z8701 Personal history of pneumonia (recurrent): Secondary | ICD-10-CM | POA: Diagnosis not present

## 2017-04-01 DIAGNOSIS — E1122 Type 2 diabetes mellitus with diabetic chronic kidney disease: Secondary | ICD-10-CM | POA: Diagnosis not present

## 2017-04-01 DIAGNOSIS — D631 Anemia in chronic kidney disease: Secondary | ICD-10-CM | POA: Diagnosis not present

## 2017-04-01 DIAGNOSIS — I129 Hypertensive chronic kidney disease with stage 1 through stage 4 chronic kidney disease, or unspecified chronic kidney disease: Secondary | ICD-10-CM | POA: Diagnosis not present

## 2017-04-01 DIAGNOSIS — A0472 Enterocolitis due to Clostridium difficile, not specified as recurrent: Secondary | ICD-10-CM | POA: Diagnosis not present

## 2017-04-01 DIAGNOSIS — E785 Hyperlipidemia, unspecified: Secondary | ICD-10-CM | POA: Diagnosis not present

## 2017-04-01 DIAGNOSIS — M170 Bilateral primary osteoarthritis of knee: Secondary | ICD-10-CM | POA: Diagnosis not present

## 2017-04-01 DIAGNOSIS — K219 Gastro-esophageal reflux disease without esophagitis: Secondary | ICD-10-CM | POA: Diagnosis not present

## 2017-04-02 ENCOUNTER — Other Ambulatory Visit: Payer: Self-pay

## 2017-04-02 NOTE — Patient Outreach (Signed)
[facility] Care Management  04/02/2017  Ann Price 08/30/1945 [id number]   Referral Date: 04/01/17 Referral Source: [employer] Report Date of Admission: 03/18/17 Diagnosis: Pneumonia and C. Diff Date of Discharge:03/30/17 Facility: [facility] Insurance: [employer]  Outreach attempt # 1 Spoke with patient.  Patient is able to verify HIPAA. She reports that she is trying.  Patient explains she has been so sick that she could not do anything.  Patient states that she gets short of breath with talking and is weak.  Advised patient that CM would not keep her long. She states that her daughter in law has been handling things for her and she is on the way over. Patient states that the home health nurse came on yesterday and was excellent.   Social: Patient lives in the home with her husband and her daughter in law comes daily to help her with everything and takes her to her appointments.    Conditions: Patient hospitalized with pneumonia and C. diff. Patient has diabetes and history of previous stroke.  Patient currently states she is trying to get her strength back after being so sick.    Medications:  Patient unable to review medications on call.    Appointments: Patient has appointment with primary care physician on Monday.  Daughter in law to take patient to appointment.    Consent: RN [facility]. Patient declined services at this time but wanted CM number for daughter in law.  CM contact information given and advised that CM would send letter and brochure for future reference.    Plan: RN CM will send letter and brochure for future reference and notify care management assistant of case status.     Bary Lericheionne J Colie Fugitt, RN, MSN [facility] Care [facility] Care Management Coordinator Direct Line [phone] Toll Free: [phone]  Fax: [phone]

## 2017-04-03 DIAGNOSIS — E785 Hyperlipidemia, unspecified: Secondary | ICD-10-CM | POA: Diagnosis not present

## 2017-04-03 DIAGNOSIS — A0472 Enterocolitis due to Clostridium difficile, not specified as recurrent: Secondary | ICD-10-CM | POA: Diagnosis not present

## 2017-04-03 DIAGNOSIS — D631 Anemia in chronic kidney disease: Secondary | ICD-10-CM | POA: Diagnosis not present

## 2017-04-03 DIAGNOSIS — K219 Gastro-esophageal reflux disease without esophagitis: Secondary | ICD-10-CM | POA: Diagnosis not present

## 2017-04-03 DIAGNOSIS — E1122 Type 2 diabetes mellitus with diabetic chronic kidney disease: Secondary | ICD-10-CM | POA: Diagnosis not present

## 2017-04-03 DIAGNOSIS — N183 Chronic kidney disease, stage 3 (moderate): Secondary | ICD-10-CM | POA: Diagnosis not present

## 2017-04-03 DIAGNOSIS — M170 Bilateral primary osteoarthritis of knee: Secondary | ICD-10-CM | POA: Diagnosis not present

## 2017-04-03 DIAGNOSIS — I129 Hypertensive chronic kidney disease with stage 1 through stage 4 chronic kidney disease, or unspecified chronic kidney disease: Secondary | ICD-10-CM | POA: Diagnosis not present

## 2017-04-03 DIAGNOSIS — Z8701 Personal history of pneumonia (recurrent): Secondary | ICD-10-CM | POA: Diagnosis not present

## 2017-04-06 DIAGNOSIS — N179 Acute kidney failure, unspecified: Secondary | ICD-10-CM | POA: Diagnosis not present

## 2017-04-06 DIAGNOSIS — G629 Polyneuropathy, unspecified: Secondary | ICD-10-CM | POA: Diagnosis not present

## 2017-04-06 DIAGNOSIS — B372 Candidiasis of skin and nail: Secondary | ICD-10-CM | POA: Diagnosis not present

## 2017-04-06 DIAGNOSIS — I10 Essential (primary) hypertension: Secondary | ICD-10-CM | POA: Diagnosis not present

## 2017-04-06 DIAGNOSIS — I129 Hypertensive chronic kidney disease with stage 1 through stage 4 chronic kidney disease, or unspecified chronic kidney disease: Secondary | ICD-10-CM | POA: Diagnosis not present

## 2017-04-06 DIAGNOSIS — E1122 Type 2 diabetes mellitus with diabetic chronic kidney disease: Secondary | ICD-10-CM | POA: Diagnosis not present

## 2017-04-06 DIAGNOSIS — N183 Chronic kidney disease, stage 3 (moderate): Secondary | ICD-10-CM | POA: Diagnosis not present

## 2017-04-06 DIAGNOSIS — M1991 Primary osteoarthritis, unspecified site: Secondary | ICD-10-CM | POA: Diagnosis not present

## 2017-04-06 DIAGNOSIS — M170 Bilateral primary osteoarthritis of knee: Secondary | ICD-10-CM | POA: Diagnosis not present

## 2017-04-06 DIAGNOSIS — R7989 Other specified abnormal findings of blood chemistry: Secondary | ICD-10-CM | POA: Diagnosis not present

## 2017-04-06 DIAGNOSIS — E875 Hyperkalemia: Secondary | ICD-10-CM | POA: Diagnosis not present

## 2017-04-06 DIAGNOSIS — D631 Anemia in chronic kidney disease: Secondary | ICD-10-CM | POA: Diagnosis not present

## 2017-04-06 DIAGNOSIS — Z8701 Personal history of pneumonia (recurrent): Secondary | ICD-10-CM | POA: Diagnosis not present

## 2017-04-06 DIAGNOSIS — E785 Hyperlipidemia, unspecified: Secondary | ICD-10-CM | POA: Diagnosis not present

## 2017-04-06 DIAGNOSIS — K219 Gastro-esophageal reflux disease without esophagitis: Secondary | ICD-10-CM | POA: Diagnosis not present

## 2017-04-06 DIAGNOSIS — E1165 Type 2 diabetes mellitus with hyperglycemia: Secondary | ICD-10-CM | POA: Diagnosis not present

## 2017-04-06 DIAGNOSIS — A0472 Enterocolitis due to Clostridium difficile, not specified as recurrent: Secondary | ICD-10-CM | POA: Diagnosis not present

## 2017-04-06 DIAGNOSIS — Z794 Long term (current) use of insulin: Secondary | ICD-10-CM | POA: Diagnosis not present

## 2017-04-06 DIAGNOSIS — Z09 Encounter for follow-up examination after completed treatment for conditions other than malignant neoplasm: Secondary | ICD-10-CM | POA: Diagnosis not present

## 2017-04-07 DIAGNOSIS — M170 Bilateral primary osteoarthritis of knee: Secondary | ICD-10-CM | POA: Diagnosis not present

## 2017-04-07 DIAGNOSIS — N183 Chronic kidney disease, stage 3 (moderate): Secondary | ICD-10-CM | POA: Diagnosis not present

## 2017-04-07 DIAGNOSIS — A0472 Enterocolitis due to Clostridium difficile, not specified as recurrent: Secondary | ICD-10-CM | POA: Diagnosis not present

## 2017-04-07 DIAGNOSIS — E785 Hyperlipidemia, unspecified: Secondary | ICD-10-CM | POA: Diagnosis not present

## 2017-04-07 DIAGNOSIS — Z8701 Personal history of pneumonia (recurrent): Secondary | ICD-10-CM | POA: Diagnosis not present

## 2017-04-07 DIAGNOSIS — D631 Anemia in chronic kidney disease: Secondary | ICD-10-CM | POA: Diagnosis not present

## 2017-04-07 DIAGNOSIS — I129 Hypertensive chronic kidney disease with stage 1 through stage 4 chronic kidney disease, or unspecified chronic kidney disease: Secondary | ICD-10-CM | POA: Diagnosis not present

## 2017-04-07 DIAGNOSIS — E1122 Type 2 diabetes mellitus with diabetic chronic kidney disease: Secondary | ICD-10-CM | POA: Diagnosis not present

## 2017-04-07 DIAGNOSIS — K219 Gastro-esophageal reflux disease without esophagitis: Secondary | ICD-10-CM | POA: Diagnosis not present

## 2017-04-08 DIAGNOSIS — E785 Hyperlipidemia, unspecified: Secondary | ICD-10-CM | POA: Diagnosis not present

## 2017-04-08 DIAGNOSIS — M170 Bilateral primary osteoarthritis of knee: Secondary | ICD-10-CM | POA: Diagnosis not present

## 2017-04-08 DIAGNOSIS — I129 Hypertensive chronic kidney disease with stage 1 through stage 4 chronic kidney disease, or unspecified chronic kidney disease: Secondary | ICD-10-CM | POA: Diagnosis not present

## 2017-04-08 DIAGNOSIS — N183 Chronic kidney disease, stage 3 (moderate): Secondary | ICD-10-CM | POA: Diagnosis not present

## 2017-04-08 DIAGNOSIS — E1122 Type 2 diabetes mellitus with diabetic chronic kidney disease: Secondary | ICD-10-CM | POA: Diagnosis not present

## 2017-04-08 DIAGNOSIS — Z8701 Personal history of pneumonia (recurrent): Secondary | ICD-10-CM | POA: Diagnosis not present

## 2017-04-08 DIAGNOSIS — K219 Gastro-esophageal reflux disease without esophagitis: Secondary | ICD-10-CM | POA: Diagnosis not present

## 2017-04-08 DIAGNOSIS — D631 Anemia in chronic kidney disease: Secondary | ICD-10-CM | POA: Diagnosis not present

## 2017-04-08 DIAGNOSIS — A0472 Enterocolitis due to Clostridium difficile, not specified as recurrent: Secondary | ICD-10-CM | POA: Diagnosis not present

## 2017-04-09 DIAGNOSIS — K219 Gastro-esophageal reflux disease without esophagitis: Secondary | ICD-10-CM | POA: Diagnosis not present

## 2017-04-09 DIAGNOSIS — I129 Hypertensive chronic kidney disease with stage 1 through stage 4 chronic kidney disease, or unspecified chronic kidney disease: Secondary | ICD-10-CM | POA: Diagnosis not present

## 2017-04-09 DIAGNOSIS — M170 Bilateral primary osteoarthritis of knee: Secondary | ICD-10-CM | POA: Diagnosis not present

## 2017-04-09 DIAGNOSIS — E1122 Type 2 diabetes mellitus with diabetic chronic kidney disease: Secondary | ICD-10-CM | POA: Diagnosis not present

## 2017-04-09 DIAGNOSIS — A0472 Enterocolitis due to Clostridium difficile, not specified as recurrent: Secondary | ICD-10-CM | POA: Diagnosis not present

## 2017-04-09 DIAGNOSIS — Z8701 Personal history of pneumonia (recurrent): Secondary | ICD-10-CM | POA: Diagnosis not present

## 2017-04-09 DIAGNOSIS — E785 Hyperlipidemia, unspecified: Secondary | ICD-10-CM | POA: Diagnosis not present

## 2017-04-09 DIAGNOSIS — N183 Chronic kidney disease, stage 3 (moderate): Secondary | ICD-10-CM | POA: Diagnosis not present

## 2017-04-09 DIAGNOSIS — D631 Anemia in chronic kidney disease: Secondary | ICD-10-CM | POA: Diagnosis not present

## 2017-04-10 DIAGNOSIS — K219 Gastro-esophageal reflux disease without esophagitis: Secondary | ICD-10-CM | POA: Diagnosis not present

## 2017-04-10 DIAGNOSIS — D631 Anemia in chronic kidney disease: Secondary | ICD-10-CM | POA: Diagnosis not present

## 2017-04-10 DIAGNOSIS — N183 Chronic kidney disease, stage 3 (moderate): Secondary | ICD-10-CM | POA: Diagnosis not present

## 2017-04-10 DIAGNOSIS — E1122 Type 2 diabetes mellitus with diabetic chronic kidney disease: Secondary | ICD-10-CM | POA: Diagnosis not present

## 2017-04-10 DIAGNOSIS — I129 Hypertensive chronic kidney disease with stage 1 through stage 4 chronic kidney disease, or unspecified chronic kidney disease: Secondary | ICD-10-CM | POA: Diagnosis not present

## 2017-04-10 DIAGNOSIS — E785 Hyperlipidemia, unspecified: Secondary | ICD-10-CM | POA: Diagnosis not present

## 2017-04-10 DIAGNOSIS — A0472 Enterocolitis due to Clostridium difficile, not specified as recurrent: Secondary | ICD-10-CM | POA: Diagnosis not present

## 2017-04-10 DIAGNOSIS — M170 Bilateral primary osteoarthritis of knee: Secondary | ICD-10-CM | POA: Diagnosis not present

## 2017-04-10 DIAGNOSIS — Z8701 Personal history of pneumonia (recurrent): Secondary | ICD-10-CM | POA: Diagnosis not present

## 2017-04-13 DIAGNOSIS — Z794 Long term (current) use of insulin: Secondary | ICD-10-CM | POA: Diagnosis not present

## 2017-04-13 DIAGNOSIS — N179 Acute kidney failure, unspecified: Secondary | ICD-10-CM | POA: Diagnosis not present

## 2017-04-13 DIAGNOSIS — Z8701 Personal history of pneumonia (recurrent): Secondary | ICD-10-CM | POA: Diagnosis not present

## 2017-04-13 DIAGNOSIS — M170 Bilateral primary osteoarthritis of knee: Secondary | ICD-10-CM | POA: Diagnosis not present

## 2017-04-13 DIAGNOSIS — I10 Essential (primary) hypertension: Secondary | ICD-10-CM | POA: Diagnosis not present

## 2017-04-13 DIAGNOSIS — I129 Hypertensive chronic kidney disease with stage 1 through stage 4 chronic kidney disease, or unspecified chronic kidney disease: Secondary | ICD-10-CM | POA: Diagnosis not present

## 2017-04-13 DIAGNOSIS — N183 Chronic kidney disease, stage 3 (moderate): Secondary | ICD-10-CM | POA: Diagnosis not present

## 2017-04-13 DIAGNOSIS — E1165 Type 2 diabetes mellitus with hyperglycemia: Secondary | ICD-10-CM | POA: Diagnosis not present

## 2017-04-13 DIAGNOSIS — A0472 Enterocolitis due to Clostridium difficile, not specified as recurrent: Secondary | ICD-10-CM | POA: Diagnosis not present

## 2017-04-13 DIAGNOSIS — K219 Gastro-esophageal reflux disease without esophagitis: Secondary | ICD-10-CM | POA: Diagnosis not present

## 2017-04-13 DIAGNOSIS — E1122 Type 2 diabetes mellitus with diabetic chronic kidney disease: Secondary | ICD-10-CM | POA: Diagnosis not present

## 2017-04-13 DIAGNOSIS — E785 Hyperlipidemia, unspecified: Secondary | ICD-10-CM | POA: Diagnosis not present

## 2017-04-13 DIAGNOSIS — D631 Anemia in chronic kidney disease: Secondary | ICD-10-CM | POA: Diagnosis not present

## 2017-04-14 DIAGNOSIS — M170 Bilateral primary osteoarthritis of knee: Secondary | ICD-10-CM | POA: Diagnosis not present

## 2017-04-14 DIAGNOSIS — I129 Hypertensive chronic kidney disease with stage 1 through stage 4 chronic kidney disease, or unspecified chronic kidney disease: Secondary | ICD-10-CM | POA: Diagnosis not present

## 2017-04-14 DIAGNOSIS — N183 Chronic kidney disease, stage 3 (moderate): Secondary | ICD-10-CM | POA: Diagnosis not present

## 2017-04-14 DIAGNOSIS — A0472 Enterocolitis due to Clostridium difficile, not specified as recurrent: Secondary | ICD-10-CM | POA: Diagnosis not present

## 2017-04-14 DIAGNOSIS — K219 Gastro-esophageal reflux disease without esophagitis: Secondary | ICD-10-CM | POA: Diagnosis not present

## 2017-04-14 DIAGNOSIS — D631 Anemia in chronic kidney disease: Secondary | ICD-10-CM | POA: Diagnosis not present

## 2017-04-14 DIAGNOSIS — E785 Hyperlipidemia, unspecified: Secondary | ICD-10-CM | POA: Diagnosis not present

## 2017-04-14 DIAGNOSIS — Z8701 Personal history of pneumonia (recurrent): Secondary | ICD-10-CM | POA: Diagnosis not present

## 2017-04-14 DIAGNOSIS — E1122 Type 2 diabetes mellitus with diabetic chronic kidney disease: Secondary | ICD-10-CM | POA: Diagnosis not present

## 2017-04-15 DIAGNOSIS — N183 Chronic kidney disease, stage 3 (moderate): Secondary | ICD-10-CM | POA: Diagnosis not present

## 2017-04-15 DIAGNOSIS — E1122 Type 2 diabetes mellitus with diabetic chronic kidney disease: Secondary | ICD-10-CM | POA: Diagnosis not present

## 2017-04-15 DIAGNOSIS — A0472 Enterocolitis due to Clostridium difficile, not specified as recurrent: Secondary | ICD-10-CM | POA: Diagnosis not present

## 2017-04-15 DIAGNOSIS — Z8701 Personal history of pneumonia (recurrent): Secondary | ICD-10-CM | POA: Diagnosis not present

## 2017-04-15 DIAGNOSIS — K219 Gastro-esophageal reflux disease without esophagitis: Secondary | ICD-10-CM | POA: Diagnosis not present

## 2017-04-15 DIAGNOSIS — D631 Anemia in chronic kidney disease: Secondary | ICD-10-CM | POA: Diagnosis not present

## 2017-04-15 DIAGNOSIS — M170 Bilateral primary osteoarthritis of knee: Secondary | ICD-10-CM | POA: Diagnosis not present

## 2017-04-15 DIAGNOSIS — I129 Hypertensive chronic kidney disease with stage 1 through stage 4 chronic kidney disease, or unspecified chronic kidney disease: Secondary | ICD-10-CM | POA: Diagnosis not present

## 2017-04-15 DIAGNOSIS — E785 Hyperlipidemia, unspecified: Secondary | ICD-10-CM | POA: Diagnosis not present

## 2017-04-17 DIAGNOSIS — E1122 Type 2 diabetes mellitus with diabetic chronic kidney disease: Secondary | ICD-10-CM | POA: Diagnosis not present

## 2017-04-17 DIAGNOSIS — M170 Bilateral primary osteoarthritis of knee: Secondary | ICD-10-CM | POA: Diagnosis not present

## 2017-04-17 DIAGNOSIS — Z8701 Personal history of pneumonia (recurrent): Secondary | ICD-10-CM | POA: Diagnosis not present

## 2017-04-17 DIAGNOSIS — A0472 Enterocolitis due to Clostridium difficile, not specified as recurrent: Secondary | ICD-10-CM | POA: Diagnosis not present

## 2017-04-17 DIAGNOSIS — I129 Hypertensive chronic kidney disease with stage 1 through stage 4 chronic kidney disease, or unspecified chronic kidney disease: Secondary | ICD-10-CM | POA: Diagnosis not present

## 2017-04-17 DIAGNOSIS — N183 Chronic kidney disease, stage 3 (moderate): Secondary | ICD-10-CM | POA: Diagnosis not present

## 2017-04-17 DIAGNOSIS — K219 Gastro-esophageal reflux disease without esophagitis: Secondary | ICD-10-CM | POA: Diagnosis not present

## 2017-04-17 DIAGNOSIS — D631 Anemia in chronic kidney disease: Secondary | ICD-10-CM | POA: Diagnosis not present

## 2017-04-17 DIAGNOSIS — E785 Hyperlipidemia, unspecified: Secondary | ICD-10-CM | POA: Diagnosis not present

## 2017-04-20 DIAGNOSIS — M170 Bilateral primary osteoarthritis of knee: Secondary | ICD-10-CM | POA: Diagnosis not present

## 2017-04-20 DIAGNOSIS — K219 Gastro-esophageal reflux disease without esophagitis: Secondary | ICD-10-CM | POA: Diagnosis not present

## 2017-04-20 DIAGNOSIS — N183 Chronic kidney disease, stage 3 (moderate): Secondary | ICD-10-CM | POA: Diagnosis not present

## 2017-04-20 DIAGNOSIS — I129 Hypertensive chronic kidney disease with stage 1 through stage 4 chronic kidney disease, or unspecified chronic kidney disease: Secondary | ICD-10-CM | POA: Diagnosis not present

## 2017-04-20 DIAGNOSIS — Z8701 Personal history of pneumonia (recurrent): Secondary | ICD-10-CM | POA: Diagnosis not present

## 2017-04-20 DIAGNOSIS — D631 Anemia in chronic kidney disease: Secondary | ICD-10-CM | POA: Diagnosis not present

## 2017-04-20 DIAGNOSIS — E785 Hyperlipidemia, unspecified: Secondary | ICD-10-CM | POA: Diagnosis not present

## 2017-04-20 DIAGNOSIS — E1122 Type 2 diabetes mellitus with diabetic chronic kidney disease: Secondary | ICD-10-CM | POA: Diagnosis not present

## 2017-04-20 DIAGNOSIS — A0472 Enterocolitis due to Clostridium difficile, not specified as recurrent: Secondary | ICD-10-CM | POA: Diagnosis not present

## 2017-04-21 DIAGNOSIS — I129 Hypertensive chronic kidney disease with stage 1 through stage 4 chronic kidney disease, or unspecified chronic kidney disease: Secondary | ICD-10-CM | POA: Diagnosis not present

## 2017-04-21 DIAGNOSIS — E785 Hyperlipidemia, unspecified: Secondary | ICD-10-CM | POA: Diagnosis not present

## 2017-04-21 DIAGNOSIS — Z8701 Personal history of pneumonia (recurrent): Secondary | ICD-10-CM | POA: Diagnosis not present

## 2017-04-21 DIAGNOSIS — M170 Bilateral primary osteoarthritis of knee: Secondary | ICD-10-CM | POA: Diagnosis not present

## 2017-04-21 DIAGNOSIS — N183 Chronic kidney disease, stage 3 (moderate): Secondary | ICD-10-CM | POA: Diagnosis not present

## 2017-04-21 DIAGNOSIS — A0472 Enterocolitis due to Clostridium difficile, not specified as recurrent: Secondary | ICD-10-CM | POA: Diagnosis not present

## 2017-04-21 DIAGNOSIS — K219 Gastro-esophageal reflux disease without esophagitis: Secondary | ICD-10-CM | POA: Diagnosis not present

## 2017-04-21 DIAGNOSIS — E1122 Type 2 diabetes mellitus with diabetic chronic kidney disease: Secondary | ICD-10-CM | POA: Diagnosis not present

## 2017-04-21 DIAGNOSIS — D631 Anemia in chronic kidney disease: Secondary | ICD-10-CM | POA: Diagnosis not present

## 2017-04-22 DIAGNOSIS — E785 Hyperlipidemia, unspecified: Secondary | ICD-10-CM | POA: Diagnosis not present

## 2017-04-22 DIAGNOSIS — D631 Anemia in chronic kidney disease: Secondary | ICD-10-CM | POA: Diagnosis not present

## 2017-04-22 DIAGNOSIS — A0472 Enterocolitis due to Clostridium difficile, not specified as recurrent: Secondary | ICD-10-CM | POA: Diagnosis not present

## 2017-04-22 DIAGNOSIS — Z8701 Personal history of pneumonia (recurrent): Secondary | ICD-10-CM | POA: Diagnosis not present

## 2017-04-22 DIAGNOSIS — E1122 Type 2 diabetes mellitus with diabetic chronic kidney disease: Secondary | ICD-10-CM | POA: Diagnosis not present

## 2017-04-22 DIAGNOSIS — N183 Chronic kidney disease, stage 3 (moderate): Secondary | ICD-10-CM | POA: Diagnosis not present

## 2017-04-22 DIAGNOSIS — K219 Gastro-esophageal reflux disease without esophagitis: Secondary | ICD-10-CM | POA: Diagnosis not present

## 2017-04-22 DIAGNOSIS — I129 Hypertensive chronic kidney disease with stage 1 through stage 4 chronic kidney disease, or unspecified chronic kidney disease: Secondary | ICD-10-CM | POA: Diagnosis not present

## 2017-04-22 DIAGNOSIS — M170 Bilateral primary osteoarthritis of knee: Secondary | ICD-10-CM | POA: Diagnosis not present

## 2017-04-24 DIAGNOSIS — E1122 Type 2 diabetes mellitus with diabetic chronic kidney disease: Secondary | ICD-10-CM | POA: Diagnosis not present

## 2017-04-24 DIAGNOSIS — N183 Chronic kidney disease, stage 3 (moderate): Secondary | ICD-10-CM | POA: Diagnosis not present

## 2017-04-24 DIAGNOSIS — M170 Bilateral primary osteoarthritis of knee: Secondary | ICD-10-CM | POA: Diagnosis not present

## 2017-04-24 DIAGNOSIS — Z8701 Personal history of pneumonia (recurrent): Secondary | ICD-10-CM | POA: Diagnosis not present

## 2017-04-24 DIAGNOSIS — D631 Anemia in chronic kidney disease: Secondary | ICD-10-CM | POA: Diagnosis not present

## 2017-04-24 DIAGNOSIS — K219 Gastro-esophageal reflux disease without esophagitis: Secondary | ICD-10-CM | POA: Diagnosis not present

## 2017-04-24 DIAGNOSIS — A0472 Enterocolitis due to Clostridium difficile, not specified as recurrent: Secondary | ICD-10-CM | POA: Diagnosis not present

## 2017-04-24 DIAGNOSIS — E785 Hyperlipidemia, unspecified: Secondary | ICD-10-CM | POA: Diagnosis not present

## 2017-04-24 DIAGNOSIS — I129 Hypertensive chronic kidney disease with stage 1 through stage 4 chronic kidney disease, or unspecified chronic kidney disease: Secondary | ICD-10-CM | POA: Diagnosis not present

## 2017-04-27 DIAGNOSIS — Z8701 Personal history of pneumonia (recurrent): Secondary | ICD-10-CM | POA: Diagnosis not present

## 2017-04-27 DIAGNOSIS — D631 Anemia in chronic kidney disease: Secondary | ICD-10-CM | POA: Diagnosis not present

## 2017-04-27 DIAGNOSIS — I129 Hypertensive chronic kidney disease with stage 1 through stage 4 chronic kidney disease, or unspecified chronic kidney disease: Secondary | ICD-10-CM | POA: Diagnosis not present

## 2017-04-27 DIAGNOSIS — E1122 Type 2 diabetes mellitus with diabetic chronic kidney disease: Secondary | ICD-10-CM | POA: Diagnosis not present

## 2017-04-27 DIAGNOSIS — E785 Hyperlipidemia, unspecified: Secondary | ICD-10-CM | POA: Diagnosis not present

## 2017-04-27 DIAGNOSIS — A0472 Enterocolitis due to Clostridium difficile, not specified as recurrent: Secondary | ICD-10-CM | POA: Diagnosis not present

## 2017-04-27 DIAGNOSIS — K219 Gastro-esophageal reflux disease without esophagitis: Secondary | ICD-10-CM | POA: Diagnosis not present

## 2017-04-27 DIAGNOSIS — M170 Bilateral primary osteoarthritis of knee: Secondary | ICD-10-CM | POA: Diagnosis not present

## 2017-04-27 DIAGNOSIS — N183 Chronic kidney disease, stage 3 (moderate): Secondary | ICD-10-CM | POA: Diagnosis not present

## 2017-04-28 DIAGNOSIS — E559 Vitamin D deficiency, unspecified: Secondary | ICD-10-CM | POA: Diagnosis not present

## 2017-04-28 DIAGNOSIS — N183 Chronic kidney disease, stage 3 (moderate): Secondary | ICD-10-CM | POA: Diagnosis not present

## 2017-04-28 DIAGNOSIS — I10 Essential (primary) hypertension: Secondary | ICD-10-CM | POA: Diagnosis not present

## 2017-04-28 DIAGNOSIS — Z794 Long term (current) use of insulin: Secondary | ICD-10-CM | POA: Diagnosis not present

## 2017-04-28 DIAGNOSIS — Z8673 Personal history of transient ischemic attack (TIA), and cerebral infarction without residual deficits: Secondary | ICD-10-CM | POA: Diagnosis not present

## 2017-04-28 DIAGNOSIS — E1165 Type 2 diabetes mellitus with hyperglycemia: Secondary | ICD-10-CM | POA: Diagnosis not present

## 2017-04-28 DIAGNOSIS — R5383 Other fatigue: Secondary | ICD-10-CM | POA: Diagnosis not present

## 2017-04-28 DIAGNOSIS — E1122 Type 2 diabetes mellitus with diabetic chronic kidney disease: Secondary | ICD-10-CM | POA: Diagnosis not present

## 2017-04-29 DIAGNOSIS — E785 Hyperlipidemia, unspecified: Secondary | ICD-10-CM | POA: Diagnosis not present

## 2017-04-29 DIAGNOSIS — M170 Bilateral primary osteoarthritis of knee: Secondary | ICD-10-CM | POA: Diagnosis not present

## 2017-04-29 DIAGNOSIS — I129 Hypertensive chronic kidney disease with stage 1 through stage 4 chronic kidney disease, or unspecified chronic kidney disease: Secondary | ICD-10-CM | POA: Diagnosis not present

## 2017-04-29 DIAGNOSIS — N183 Chronic kidney disease, stage 3 (moderate): Secondary | ICD-10-CM | POA: Diagnosis not present

## 2017-04-29 DIAGNOSIS — D631 Anemia in chronic kidney disease: Secondary | ICD-10-CM | POA: Diagnosis not present

## 2017-04-29 DIAGNOSIS — Z8701 Personal history of pneumonia (recurrent): Secondary | ICD-10-CM | POA: Diagnosis not present

## 2017-04-29 DIAGNOSIS — E1122 Type 2 diabetes mellitus with diabetic chronic kidney disease: Secondary | ICD-10-CM | POA: Diagnosis not present

## 2017-04-29 DIAGNOSIS — K219 Gastro-esophageal reflux disease without esophagitis: Secondary | ICD-10-CM | POA: Diagnosis not present

## 2017-04-29 DIAGNOSIS — A0472 Enterocolitis due to Clostridium difficile, not specified as recurrent: Secondary | ICD-10-CM | POA: Diagnosis not present

## 2017-05-06 DIAGNOSIS — I129 Hypertensive chronic kidney disease with stage 1 through stage 4 chronic kidney disease, or unspecified chronic kidney disease: Secondary | ICD-10-CM | POA: Diagnosis not present

## 2017-05-06 DIAGNOSIS — D631 Anemia in chronic kidney disease: Secondary | ICD-10-CM | POA: Diagnosis not present

## 2017-05-06 DIAGNOSIS — M170 Bilateral primary osteoarthritis of knee: Secondary | ICD-10-CM | POA: Diagnosis not present

## 2017-05-06 DIAGNOSIS — E1122 Type 2 diabetes mellitus with diabetic chronic kidney disease: Secondary | ICD-10-CM | POA: Diagnosis not present

## 2017-05-06 DIAGNOSIS — N183 Chronic kidney disease, stage 3 (moderate): Secondary | ICD-10-CM | POA: Diagnosis not present

## 2017-05-06 DIAGNOSIS — K219 Gastro-esophageal reflux disease without esophagitis: Secondary | ICD-10-CM | POA: Diagnosis not present

## 2017-05-06 DIAGNOSIS — Z8701 Personal history of pneumonia (recurrent): Secondary | ICD-10-CM | POA: Diagnosis not present

## 2017-05-06 DIAGNOSIS — A0472 Enterocolitis due to Clostridium difficile, not specified as recurrent: Secondary | ICD-10-CM | POA: Diagnosis not present

## 2017-05-06 DIAGNOSIS — E785 Hyperlipidemia, unspecified: Secondary | ICD-10-CM | POA: Diagnosis not present

## 2017-05-12 DIAGNOSIS — N183 Chronic kidney disease, stage 3 (moderate): Secondary | ICD-10-CM | POA: Diagnosis not present

## 2017-05-12 DIAGNOSIS — E785 Hyperlipidemia, unspecified: Secondary | ICD-10-CM | POA: Diagnosis not present

## 2017-05-12 DIAGNOSIS — K219 Gastro-esophageal reflux disease without esophagitis: Secondary | ICD-10-CM | POA: Diagnosis not present

## 2017-05-12 DIAGNOSIS — E1122 Type 2 diabetes mellitus with diabetic chronic kidney disease: Secondary | ICD-10-CM | POA: Diagnosis not present

## 2017-05-12 DIAGNOSIS — D631 Anemia in chronic kidney disease: Secondary | ICD-10-CM | POA: Diagnosis not present

## 2017-05-12 DIAGNOSIS — I129 Hypertensive chronic kidney disease with stage 1 through stage 4 chronic kidney disease, or unspecified chronic kidney disease: Secondary | ICD-10-CM | POA: Diagnosis not present

## 2017-05-12 DIAGNOSIS — M170 Bilateral primary osteoarthritis of knee: Secondary | ICD-10-CM | POA: Diagnosis not present

## 2017-05-12 DIAGNOSIS — Z8701 Personal history of pneumonia (recurrent): Secondary | ICD-10-CM | POA: Diagnosis not present

## 2017-05-12 DIAGNOSIS — A0472 Enterocolitis due to Clostridium difficile, not specified as recurrent: Secondary | ICD-10-CM | POA: Diagnosis not present

## 2017-05-13 DIAGNOSIS — R0600 Dyspnea, unspecified: Secondary | ICD-10-CM | POA: Diagnosis not present

## 2017-05-13 DIAGNOSIS — E119 Type 2 diabetes mellitus without complications: Secondary | ICD-10-CM | POA: Diagnosis not present

## 2017-05-13 DIAGNOSIS — E669 Obesity, unspecified: Secondary | ICD-10-CM | POA: Diagnosis not present

## 2017-05-13 DIAGNOSIS — I10 Essential (primary) hypertension: Secondary | ICD-10-CM | POA: Diagnosis not present

## 2017-05-21 DIAGNOSIS — K219 Gastro-esophageal reflux disease without esophagitis: Secondary | ICD-10-CM | POA: Diagnosis not present

## 2017-05-21 DIAGNOSIS — A0472 Enterocolitis due to Clostridium difficile, not specified as recurrent: Secondary | ICD-10-CM | POA: Diagnosis not present

## 2017-05-21 DIAGNOSIS — Z8701 Personal history of pneumonia (recurrent): Secondary | ICD-10-CM | POA: Diagnosis not present

## 2017-05-21 DIAGNOSIS — M170 Bilateral primary osteoarthritis of knee: Secondary | ICD-10-CM | POA: Diagnosis not present

## 2017-05-21 DIAGNOSIS — E785 Hyperlipidemia, unspecified: Secondary | ICD-10-CM | POA: Diagnosis not present

## 2017-05-21 DIAGNOSIS — I129 Hypertensive chronic kidney disease with stage 1 through stage 4 chronic kidney disease, or unspecified chronic kidney disease: Secondary | ICD-10-CM | POA: Diagnosis not present

## 2017-05-21 DIAGNOSIS — N183 Chronic kidney disease, stage 3 (moderate): Secondary | ICD-10-CM | POA: Diagnosis not present

## 2017-05-21 DIAGNOSIS — E1122 Type 2 diabetes mellitus with diabetic chronic kidney disease: Secondary | ICD-10-CM | POA: Diagnosis not present

## 2017-05-21 DIAGNOSIS — D631 Anemia in chronic kidney disease: Secondary | ICD-10-CM | POA: Diagnosis not present

## 2017-06-16 DIAGNOSIS — Z1231 Encounter for screening mammogram for malignant neoplasm of breast: Secondary | ICD-10-CM | POA: Diagnosis not present

## 2017-06-29 DIAGNOSIS — I10 Essential (primary) hypertension: Secondary | ICD-10-CM | POA: Diagnosis not present

## 2017-06-29 DIAGNOSIS — E1165 Type 2 diabetes mellitus with hyperglycemia: Secondary | ICD-10-CM | POA: Diagnosis not present

## 2017-06-29 DIAGNOSIS — E782 Mixed hyperlipidemia: Secondary | ICD-10-CM | POA: Diagnosis not present

## 2017-06-29 DIAGNOSIS — Z794 Long term (current) use of insulin: Secondary | ICD-10-CM | POA: Diagnosis not present

## 2017-06-29 DIAGNOSIS — R0609 Other forms of dyspnea: Secondary | ICD-10-CM | POA: Diagnosis not present

## 2017-06-29 DIAGNOSIS — I739 Peripheral vascular disease, unspecified: Secondary | ICD-10-CM | POA: Diagnosis not present

## 2017-06-29 DIAGNOSIS — N183 Chronic kidney disease, stage 3 (moderate): Secondary | ICD-10-CM | POA: Diagnosis not present

## 2017-06-29 DIAGNOSIS — I129 Hypertensive chronic kidney disease with stage 1 through stage 4 chronic kidney disease, or unspecified chronic kidney disease: Secondary | ICD-10-CM | POA: Diagnosis not present

## 2017-06-29 DIAGNOSIS — E1122 Type 2 diabetes mellitus with diabetic chronic kidney disease: Secondary | ICD-10-CM | POA: Diagnosis not present

## 2017-07-13 DIAGNOSIS — E559 Vitamin D deficiency, unspecified: Secondary | ICD-10-CM | POA: Diagnosis not present

## 2017-07-13 DIAGNOSIS — E1122 Type 2 diabetes mellitus with diabetic chronic kidney disease: Secondary | ICD-10-CM | POA: Diagnosis not present

## 2017-07-13 DIAGNOSIS — E1165 Type 2 diabetes mellitus with hyperglycemia: Secondary | ICD-10-CM | POA: Diagnosis not present

## 2017-07-13 DIAGNOSIS — I10 Essential (primary) hypertension: Secondary | ICD-10-CM | POA: Diagnosis not present

## 2017-07-13 DIAGNOSIS — N183 Chronic kidney disease, stage 3 (moderate): Secondary | ICD-10-CM | POA: Diagnosis not present

## 2017-07-13 DIAGNOSIS — Z8673 Personal history of transient ischemic attack (TIA), and cerebral infarction without residual deficits: Secondary | ICD-10-CM | POA: Diagnosis not present

## 2017-07-13 DIAGNOSIS — Z794 Long term (current) use of insulin: Secondary | ICD-10-CM | POA: Diagnosis not present

## 2017-07-20 DIAGNOSIS — R0609 Other forms of dyspnea: Secondary | ICD-10-CM | POA: Diagnosis not present

## 2017-07-20 DIAGNOSIS — R918 Other nonspecific abnormal finding of lung field: Secondary | ICD-10-CM | POA: Diagnosis not present

## 2017-08-18 DIAGNOSIS — E1165 Type 2 diabetes mellitus with hyperglycemia: Secondary | ICD-10-CM | POA: Diagnosis not present

## 2017-08-18 DIAGNOSIS — E119 Type 2 diabetes mellitus without complications: Secondary | ICD-10-CM | POA: Diagnosis not present

## 2017-08-18 DIAGNOSIS — I10 Essential (primary) hypertension: Secondary | ICD-10-CM | POA: Diagnosis not present

## 2017-08-18 DIAGNOSIS — Z8673 Personal history of transient ischemic attack (TIA), and cerebral infarction without residual deficits: Secondary | ICD-10-CM | POA: Diagnosis not present

## 2017-08-18 DIAGNOSIS — E1122 Type 2 diabetes mellitus with diabetic chronic kidney disease: Secondary | ICD-10-CM | POA: Diagnosis not present

## 2017-08-18 DIAGNOSIS — E559 Vitamin D deficiency, unspecified: Secondary | ICD-10-CM | POA: Diagnosis not present

## 2017-08-18 DIAGNOSIS — Z794 Long term (current) use of insulin: Secondary | ICD-10-CM | POA: Diagnosis not present

## 2017-08-18 DIAGNOSIS — N183 Chronic kidney disease, stage 3 (moderate): Secondary | ICD-10-CM | POA: Diagnosis not present

## 2017-10-19 DIAGNOSIS — I10 Essential (primary) hypertension: Secondary | ICD-10-CM | POA: Diagnosis not present

## 2017-10-19 DIAGNOSIS — E1169 Type 2 diabetes mellitus with other specified complication: Secondary | ICD-10-CM | POA: Diagnosis not present

## 2017-10-19 DIAGNOSIS — Z794 Long term (current) use of insulin: Secondary | ICD-10-CM | POA: Diagnosis not present

## 2017-10-19 DIAGNOSIS — E1122 Type 2 diabetes mellitus with diabetic chronic kidney disease: Secondary | ICD-10-CM | POA: Diagnosis not present

## 2017-10-19 DIAGNOSIS — N179 Acute kidney failure, unspecified: Secondary | ICD-10-CM | POA: Diagnosis not present

## 2017-10-19 DIAGNOSIS — Z23 Encounter for immunization: Secondary | ICD-10-CM | POA: Diagnosis not present

## 2017-10-19 DIAGNOSIS — E1165 Type 2 diabetes mellitus with hyperglycemia: Secondary | ICD-10-CM | POA: Diagnosis not present

## 2017-10-19 DIAGNOSIS — Z8673 Personal history of transient ischemic attack (TIA), and cerebral infarction without residual deficits: Secondary | ICD-10-CM | POA: Diagnosis not present

## 2017-10-19 DIAGNOSIS — N183 Chronic kidney disease, stage 3 (moderate): Secondary | ICD-10-CM | POA: Diagnosis not present

## 2017-10-19 DIAGNOSIS — I129 Hypertensive chronic kidney disease with stage 1 through stage 4 chronic kidney disease, or unspecified chronic kidney disease: Secondary | ICD-10-CM | POA: Diagnosis not present

## 2017-12-21 DIAGNOSIS — Z794 Long term (current) use of insulin: Secondary | ICD-10-CM | POA: Diagnosis not present

## 2017-12-21 DIAGNOSIS — E1165 Type 2 diabetes mellitus with hyperglycemia: Secondary | ICD-10-CM | POA: Diagnosis not present

## 2017-12-21 DIAGNOSIS — N183 Chronic kidney disease, stage 3 (moderate): Secondary | ICD-10-CM | POA: Diagnosis not present

## 2017-12-21 DIAGNOSIS — E1122 Type 2 diabetes mellitus with diabetic chronic kidney disease: Secondary | ICD-10-CM | POA: Diagnosis not present

## 2017-12-21 DIAGNOSIS — Z0000 Encounter for general adult medical examination without abnormal findings: Secondary | ICD-10-CM | POA: Diagnosis not present

## 2017-12-21 DIAGNOSIS — R799 Abnormal finding of blood chemistry, unspecified: Secondary | ICD-10-CM | POA: Diagnosis not present

## 2017-12-21 DIAGNOSIS — I10 Essential (primary) hypertension: Secondary | ICD-10-CM | POA: Diagnosis not present

## 2017-12-21 DIAGNOSIS — N179 Acute kidney failure, unspecified: Secondary | ICD-10-CM | POA: Diagnosis not present

## 2020-03-19 ENCOUNTER — Emergency Department
Admission: EM | Admit: 2020-03-19 | Discharge: 2020-03-19 | Disposition: A | Discharge status: Home / Self Care | Payer: Medicare HMO | Attending: Emergency Medicine | Admitting: Emergency Medicine

## 2020-03-19 ENCOUNTER — Other Ambulatory Visit: Payer: Self-pay

## 2020-03-19 ENCOUNTER — Encounter: Payer: Self-pay | Admitting: Emergency Medicine

## 2020-03-19 ENCOUNTER — Emergency Department: Payer: Medicare HMO

## 2020-03-19 DIAGNOSIS — G500 Trigeminal neuralgia: Secondary | ICD-10-CM

## 2020-03-19 DIAGNOSIS — Z794 Long term (current) use of insulin: Secondary | ICD-10-CM | POA: Diagnosis not present

## 2020-03-19 DIAGNOSIS — R519 Headache, unspecified: Secondary | ICD-10-CM | POA: Diagnosis present

## 2020-03-19 DIAGNOSIS — H9202 Otalgia, left ear: Secondary | ICD-10-CM | POA: Diagnosis not present

## 2020-03-19 DIAGNOSIS — I10 Essential (primary) hypertension: Secondary | ICD-10-CM | POA: Diagnosis not present

## 2020-03-19 DIAGNOSIS — Z7902 Long term (current) use of antithrombotics/antiplatelets: Secondary | ICD-10-CM | POA: Diagnosis not present

## 2020-03-19 DIAGNOSIS — E119 Type 2 diabetes mellitus without complications: Secondary | ICD-10-CM | POA: Diagnosis not present

## 2020-03-19 DIAGNOSIS — Z20822 Contact with and (suspected) exposure to covid-19: Secondary | ICD-10-CM | POA: Insufficient documentation

## 2020-03-19 DIAGNOSIS — Z79899 Other long term (current) drug therapy: Secondary | ICD-10-CM | POA: Diagnosis not present

## 2020-03-19 LAB — CBC WITH DIFFERENTIAL/PLATELET
Abs Immature Granulocytes: 0.03 10*3/uL (ref 0.00–0.07)
Basophils Absolute: 0.1 10*3/uL (ref 0.0–0.1)
Basophils Relative: 1 %
Eosinophils Absolute: 0.1 10*3/uL (ref 0.0–0.5)
Eosinophils Relative: 2 %
HCT: 36.4 % (ref 36.0–46.0)
Hemoglobin: 11.1 g/dL — ABNORMAL LOW (ref 12.0–15.0)
Immature Granulocytes: 0 %
Lymphocytes Relative: 28 %
Lymphs Abs: 2.3 10*3/uL (ref 0.7–4.0)
MCH: 22.1 pg — ABNORMAL LOW (ref 26.0–34.0)
MCHC: 30.5 g/dL (ref 30.0–36.0)
MCV: 72.5 fL — ABNORMAL LOW (ref 80.0–100.0)
Monocytes Absolute: 0.4 10*3/uL (ref 0.1–1.0)
Monocytes Relative: 5 %
Neutro Abs: 5.3 10*3/uL (ref 1.7–7.7)
Neutrophils Relative %: 64 %
Platelets: 278 10*3/uL (ref 150–400)
RBC: 5.02 MIL/uL (ref 3.87–5.11)
RDW: 17.8 % — ABNORMAL HIGH (ref 11.5–15.5)
WBC: 8.2 10*3/uL (ref 4.0–10.5)
nRBC: 0 % (ref 0.0–0.2)

## 2020-03-19 LAB — COMPREHENSIVE METABOLIC PANEL
ALT: 14 U/L (ref 0–44)
AST: 17 U/L (ref 15–41)
Albumin: 4.1 g/dL (ref 3.5–5.0)
Alkaline Phosphatase: 81 U/L (ref 38–126)
Anion gap: 7 (ref 5–15)
BUN: 28 mg/dL — ABNORMAL HIGH (ref 8–23)
CO2: 25 mmol/L (ref 22–32)
Calcium: 9.1 mg/dL (ref 8.9–10.3)
Chloride: 105 mmol/L (ref 98–111)
Creatinine, Ser: 1.66 mg/dL — ABNORMAL HIGH (ref 0.44–1.00)
GFR, Estimated: 32 mL/min — ABNORMAL LOW (ref >60)
Glucose, Bld: 75 mg/dL (ref 70–99)
Potassium: 4.5 mmol/L (ref 3.5–5.1)
Sodium: 137 mmol/L (ref 135–145)
Total Bilirubin: 0.6 mg/dL (ref 0.3–1.2)
Total Protein: 8 g/dL (ref 6.5–8.1)

## 2020-03-19 LAB — RESP PANEL BY RT-PCR (FLU A&B, COVID) ARPGX2
Influenza A by PCR: NEGATIVE
Influenza B by PCR: NEGATIVE
SARS Coronavirus 2 by RT PCR: NEGATIVE

## 2020-03-19 MED ORDER — PROCHLORPERAZINE EDISYLATE 10 MG/2ML IJ SOLN
10.0000 mg | Freq: Once | INTRAMUSCULAR | Status: AC
  Administered 2020-03-19: 10 mg via INTRAVENOUS
  Filled 2020-03-19: qty 2

## 2020-03-19 MED ORDER — IOHEXOL 350 MG/ML SOLN
60.0000 mL | Freq: Once | INTRAVENOUS | Status: AC | PRN
  Administered 2020-03-19: 60 mL via INTRAVENOUS
  Filled 2020-03-19: qty 60

## 2020-03-19 MED ORDER — LACTATED RINGERS IV BOLUS
1000.0000 mL | Freq: Once | INTRAVENOUS | Status: AC
  Administered 2020-03-19: 1000 mL via INTRAVENOUS

## 2020-03-19 MED ORDER — DIPHENHYDRAMINE HCL 50 MG/ML IJ SOLN
25.0000 mg | Freq: Once | INTRAMUSCULAR | Status: AC
  Administered 2020-03-19: 25 mg via INTRAVENOUS
  Filled 2020-03-19: qty 1

## 2020-03-19 MED ORDER — PROCHLORPERAZINE MALEATE 5 MG PO TABS: 5.0000 mg | ORAL_TABLET | Freq: Four times a day (QID) | ORAL | 0 refills | Status: AC | PRN

## 2020-03-19 NOTE — ED Triage Notes (Signed)
First Nurse Note:  C/O left ear pain x 1 day.  Sent from [facility] for evaluation.  AAOx3.  Skin warm and dry. NAD

## 2020-03-19 NOTE — ED Notes (Signed)
See triage note  Presents with severe left ear pain which started today  States pain is shooting into left side of head  Has taken tylenol w/o relief  No fever

## 2020-03-19 NOTE — ED Notes (Signed)
ER provider notified that pt wants to leave and did not finish LR bolus. Please see this RNs note at 22:27. Pt states she will drink more water at home

## 2020-03-19 NOTE — ED Notes (Addendum)
When pt came back from CT scan, LR was not infusing. RN went to discharge pt and noted she had LR left in bag. RN restarted LR. RN went back into room and pts arm noted to be bent and LR bolus slowing infusing. Pt states she wants to go home and does not want to wait for the LR bolus to finish. Pt has been educated on the importance of it to flush the kidneys due to the contrast after the CT. Pt states she wants to go home. Pt states she will drink more water.

## 2020-03-19 NOTE — Discharge Instructions (Addendum)
Take the medication prescribed as needed for left-sided headache.  Do not drive or operate machinery on this medication.  Please follow-up with neurology for evaluation of possible trigeminal neuralgia.  Please return to the emergency department for any acute worsening.

## 2020-03-20 NOTE — ED Provider Notes (Signed)
[facility] Emergency Department Provider Note  ____________________________________________   Event Date/Time   First MD Initiated Contact with Patient 03/19/20 1635     (approximate)  I have reviewed the triage vital signs and the nursing notes.   HISTORY  Chief Complaint Otalgia  HPI Ann Price is a 75 y.o. female who presents to the emergency department for evaluation of the left ear and left-sided scalp and head pain that began when she awoke this morning.  Patient denies history of similar in the past.  She denies recent ear infection.  Does have history of multiple strokes.  Describes the feeling as a sharp shooting pain that ranges in her temporal and parietal regions.  She denies any right-sided facial pain.  Denies blurred vision, diplopia, slurred speech, aphasia, central or extremity weakness.  Pain is rated 10/10, without any obvious relieving factors.        Past Medical History:  Diagnosis Date  . Arthritis    knees  . Diabetes mellitus without complication (HCC)   . GERD (gastroesophageal reflux disease)   . Hyperlipidemia   . Hypertension   . Stroke (HCC) 2016   (TIA) no deficits  . Wears dentures    full upper, partial lower    Patient Active Problem List   Diagnosis Date Noted  . Sepsis (HCC) 03/18/2017  . CAP (community acquired pneumonia) 03/18/2017  . Diabetes (HCC) 03/18/2017  . HTN (hypertension) 03/18/2017  . HLD (hyperlipidemia) 03/18/2017  . GERD (gastroesophageal reflux disease) 03/18/2017  . AKI (acute kidney injury) (HCC) 03/18/2017  . CVA (cerebral vascular accident) (HCC) 06/21/2016    Past Surgical History:  Procedure Laterality Date  . CATARACT EXTRACTION W/PHACO Right 03/25/2016   Procedure: CATARACT EXTRACTION PHACO AND INTRAOCULAR LENS PLACEMENT (IOC)  right diabetic;  Surgeon: Nevada Crane, MD;  Location: [facility];  Service: Ophthalmology;  Laterality: Right;  diabetic - oral meds   . CATARACT EXTRACTION W/PHACO Left 06/03/2016   Procedure: CATARACT EXTRACTION PHACO AND INTRAOCULAR LENS PLACEMENT (IOC);  Surgeon: Nevada Crane, MD;  Location: [facility];  Service: Ophthalmology;  Laterality: Left;  LEFT DIABETES - oral meds  . COLONOSCOPY    . KNEE ARTHROSCOPY Right 03/22/2015   Procedure: ARTHROSCOPY KNEE, PARTIAL MEDIAL MENISECTOMY;  Surgeon: Kennedy Bucker, MD;  Location: [facility];  Service: Orthopedics;  Laterality: Right;  . TUBAL LIGATION    . UPPER GI ENDOSCOPY    . VAGINAL HYSTERECTOMY      Prior to Admission medications   Medication Sig Start Date End Date Taking? Authorizing Provider  prochlorperazine (COMPAZINE) 5 MG tablet Take 1 tablet (5 mg total) by mouth every 6 (six) hours as needed (left sided headache). 03/19/20  Yes Lucy Chris, PA  amLODipine (NORVASC) 10 MG tablet Take 10 mg by mouth daily.    [provider]  atenolol (TENORMIN) 50 MG tablet Take 50 mg by mouth daily.    [provider]  atorvastatin (LIPITOR) 40 MG tablet Take 1 tablet (40 mg total) by mouth daily at 6 PM. 06/22/16   Shaune Pollack, MD  clopidogrel (PLAVIX) 75 MG tablet Take 1 tablet (75 mg total) by mouth daily. 06/22/16   Shaune Pollack, MD  hydrALAZINE (APRESOLINE) 25 MG tablet Take 25 mg by mouth 3 (three) times daily.     [provider]  insulin aspart (NOVOLOG) 100 UNIT/ML injection Inject 0-5 Units into the skin at bedtime. 03/30/17   Katha Hamming, MD  insulin  aspart (NOVOLOG) 100 UNIT/ML injection Inject 0-9 Units into the skin 3 (three) times daily with meals. 03/30/17   Katha Hamming, MD  metoCLOPramide (REGLAN) 5 MG tablet Take 1 tablet (5 mg total) by mouth 3 (three) times daily. 03/30/17 03/30/18  Katha Hamming, MD  omeprazole (PRILOSEC) 40 MG capsule Take 1 capsule (40 mg total) by mouth daily. 03/30/17 03/30/18  Katha Hamming, MD  vancomycin (VANCOCIN) 50 mg/mL oral solution Take 2.5 mLs (125 mg total) by mouth  every 6 (six) hours. 03/30/17   Katha Hamming, MD    Allergies Aspirin and Penicillins  Family History  Problem Relation Age of Onset  . Hypertension Mother   . Hyperlipidemia Mother   . Hypertension Father   . Hyperlipidemia Father   . Diabetes Father   . Diabetes Maternal Grandmother     Social History Social History   Tobacco Use  . Smoking status: Never Smoker  . Smokeless tobacco: Never Used  Substance Use Topics  . Alcohol use: No  . Drug use: No    Review of Systems Constitutional: No fever/chills Eyes: No visual changes. ENT: + Left ear pain, no sore throat. Cardiovascular: Denies chest pain. Respiratory: Denies shortness of breath. Gastrointestinal: No abdominal pain.  No nausea, no vomiting.  No diarrhea.  No constipation. Genitourinary: Negative for dysuria. Musculoskeletal: Negative for back pain. Skin: Negative for rash. Neurological: + headaches, negative for focal weakness or numbness.   ____________________________________________   PHYSICAL EXAM:  VITAL SIGNS: ED Triage Vitals  Enc Vitals Group     BP 03/19/20 1618 (!) 139/92     Pulse Rate 03/19/20 1618 79     Resp 03/19/20 1618 18     Temp 03/19/20 1628 98.1 F (36.7 C)     Temp Source 03/19/20 1628 Oral     SpO2 03/19/20 1618 98 %     Weight 03/19/20 1605 211 lb 10.3 oz (96 kg)     Height 03/19/20 1605  (1.6 m)     Head Circumference --      Peak Flow --      Pain Score 03/19/20 1604 10     Pain Loc --      Pain Edu? --      Excl. in GC? --    Constitutional: Alert and oriented. Well appearing and in no acute distress. Eyes: Conjunctivae are normal. PERRL. EOMI. Head: Atraumatic. Nose: No congestion/rhinnorhea. Mouth/Throat: Mucous membranes are moist.  Oropharynx non-erythematous. Ears: The bilateral TMs are visualized, pearly gray without any evidence of erythema or bulging.  Ear canals are patent without any obstruction. Neck: No stridor.  No tenderness to  palpation of midline or paraspinals of the cervical spine.  Full range of motion. Cardiovascular: Normal rate, regular rhythm. Grossly normal heart sounds.  Good peripheral circulation. Respiratory: Normal respiratory effort.  No retractions. Lungs CTAB. Gastrointestinal: Soft and nontender. No distention. No abdominal bruits. No CVA tenderness. Musculoskeletal: No lower extremity tenderness nor edema.  No joint effusions. Neurologic:  Normal speech and language.  Cranial nerves II through XII grossly intact.  No gross focal neurologic deficits are appreciated. No gait instability. Skin:  Skin is warm, dry and intact. No rash noted. Psychiatric: Mood and affect are normal. Speech and behavior are normal.  ____________________________________________   LABS (all labs ordered are listed, but only abnormal results are displayed)  Labs Reviewed  COMPREHENSIVE METABOLIC PANEL - Abnormal; Notable for the following components:      Result Value   BUN  28 (*)    Creatinine, Ser 1.66 (*)    GFR, Estimated 32 (*)    All other components within normal limits  CBC WITH DIFFERENTIAL/PLATELET - Abnormal; Notable for the following components:   Hemoglobin 11.1 (*)    MCV 72.5 (*)    MCH 22.1 (*)    RDW 17.8 (*)    All other components within normal limits  RESP PANEL BY RT-PCR (FLU A&B, COVID) ARPGX2   ____________________________________________  RADIOLOGY  Official radiology report(s): CT Angio Head W or Wo Contrast  Result Date: 03/19/2020 CLINICAL DATA:  Left scalp pain radiating to the ear. EXAM: CT ANGIOGRAPHY HEAD AND NECK TECHNIQUE: Multidetector CT imaging of the head and neck was performed using the standard protocol during bolus administration of intravenous contrast. Multiplanar CT image reconstructions and MIPs were obtained to evaluate the vascular anatomy. Carotid stenosis measurements (when applicable) are obtained utilizing NASCET criteria, using the distal internal carotid  diameter as the denominator. CONTRAST:  38mL OMNIPAQUE IOHEXOL 350 MG/ML SOLN COMPARISON:  None. FINDINGS: CTA NECK FINDINGS SKELETON: There is no bony spinal canal stenosis. No lytic or blastic lesion. OTHER NECK: Normal pharynx, larynx and major salivary glands. No cervical lymphadenopathy. Unremarkable thyroid gland. UPPER CHEST: No pneumothorax or pleural effusion. No nodules or masses. AORTIC ARCH: There is no calcific atherosclerosis of the aortic arch. There is no aneurysm, dissection or hemodynamically significant stenosis of the visualized portion of the aorta. Conventional 3 vessel aortic branching pattern. The visualized proximal subclavian arteries are widely patent. RIGHT CAROTID SYSTEM: Normal without aneurysm, dissection or stenosis. LEFT CAROTID SYSTEM: Normal without aneurysm, dissection or stenosis. VERTEBRAL ARTERIES: Right dominant configuration. Both origins are clearly patent. There is no dissection, occlusion or flow-limiting stenosis to the skull base (V1-V3 segments). CTA HEAD FINDINGS POSTERIOR CIRCULATION: --Vertebral arteries: Normal V4 segments. --Inferior cerebellar arteries: Normal. --Basilar artery: Normal. --Superior cerebellar arteries: Normal. --Posterior cerebral arteries (PCA): Normal. ANTERIOR CIRCULATION: --Intracranial internal carotid arteries: Normal. --Anterior cerebral arteries (ACA): Normal. Both A1 segments are present. Patent anterior communicating artery (a-comm). --Middle cerebral arteries (MCA): Normal. VENOUS SINUSES: As permitted by contrast timing, patent. ANATOMIC VARIANTS: None Review of the MIP images confirms the above findings. IMPRESSION: Normal CTA of the head and neck. Electronically Signed   By: Deatra Robinson M.D.   On: 03/19/2020 21:07   CT Head Wo Contrast  Result Date: 03/19/2020 CLINICAL DATA:  75 year old female with headache. EXAM: CT HEAD WITHOUT CONTRAST TECHNIQUE: Contiguous axial images were obtained from the base of the skull through the  vertex without intravenous contrast. COMPARISON:  Head CT dated 06/21/2016. FINDINGS: Brain: Mild age-related atrophy and chronic microvascular ischemic changes. There is no acute intracranial hemorrhage. No mass effect midline shift. No extra-axial fluid collection. Vascular: No hyperdense vessel or unexpected calcification. Skull: Normal. Negative for fracture or focal lesion. Sinuses/Orbits: No acute finding. Other: None IMPRESSION: 1. No acute intracranial pathology. 2. Mild age-related atrophy and chronic microvascular ischemic changes. Electronically Signed   By: Elgie Collard M.D.   On: 03/19/2020 18:42   CT Angio Neck W and/or Wo Contrast  Result Date: 03/19/2020 CLINICAL DATA:  Left scalp pain radiating to the ear. EXAM: CT ANGIOGRAPHY HEAD AND NECK TECHNIQUE: Multidetector CT imaging of the head and neck was performed using the standard protocol during bolus administration of intravenous contrast. Multiplanar CT image reconstructions and MIPs were obtained to evaluate the vascular anatomy. Carotid stenosis measurements (when applicable) are obtained utilizing NASCET criteria, using the distal internal carotid diameter  as the denominator. CONTRAST:  76mL OMNIPAQUE IOHEXOL 350 MG/ML SOLN COMPARISON:  None. FINDINGS: CTA NECK FINDINGS SKELETON: There is no bony spinal canal stenosis. No lytic or blastic lesion. OTHER NECK: Normal pharynx, larynx and major salivary glands. No cervical lymphadenopathy. Unremarkable thyroid gland. UPPER CHEST: No pneumothorax or pleural effusion. No nodules or masses. AORTIC ARCH: There is no calcific atherosclerosis of the aortic arch. There is no aneurysm, dissection or hemodynamically significant stenosis of the visualized portion of the aorta. Conventional 3 vessel aortic branching pattern. The visualized proximal subclavian arteries are widely patent. RIGHT CAROTID SYSTEM: Normal without aneurysm, dissection or stenosis. LEFT CAROTID SYSTEM: Normal without aneurysm,  dissection or stenosis. VERTEBRAL ARTERIES: Right dominant configuration. Both origins are clearly patent. There is no dissection, occlusion or flow-limiting stenosis to the skull base (V1-V3 segments). CTA HEAD FINDINGS POSTERIOR CIRCULATION: --Vertebral arteries: Normal V4 segments. --Inferior cerebellar arteries: Normal. --Basilar artery: Normal. --Superior cerebellar arteries: Normal. --Posterior cerebral arteries (PCA): Normal. ANTERIOR CIRCULATION: --Intracranial internal carotid arteries: Normal. --Anterior cerebral arteries (ACA): Normal. Both A1 segments are present. Patent anterior communicating artery (a-comm). --Middle cerebral arteries (MCA): Normal. VENOUS SINUSES: As permitted by contrast timing, patent. ANATOMIC VARIANTS: None Review of the MIP images confirms the above findings. IMPRESSION: Normal CTA of the head and neck. Electronically Signed   By: Deatra Robinson M.D.   On: 03/19/2020 21:07   ____________________________________________   INITIAL IMPRESSION / ASSESSMENT AND PLAN / ED COURSE  As part of my medical decision making, I reviewed the following data within the electronic MEDICAL RECORD NUMBER Nursing notes reviewed and incorporated, Labs reviewed, Evaluated by EM attending and Notes from prior ED visits        Patient is a 75 year old female who reports to the emergency department for evaluation of acute severe left-sided headache that began when she woke this morning.  She reports that radiates from the left ear across the rest of the left side of her scalp.  There is no right-sided pain.  She denies any appreciated weakness.  She does have significant history of CVA.  See HPI for further details.  In triage, the patient is mildly hypertensive but otherwise has normal vital signs.  On physical exam, she does not have any acute abnormal findings of the ear, cranial nerves II through XII are grossly intact, there is no rash to suggest herpes zoster.  Patient's pain seems to be  intermittently acute and severe and then easing off for 30 to 60 seconds before recurring again.  Given the patient's history, differentials include trigeminal neuralgia, atypical migraine, aortic dissection, other intracranial vascular pathology.  Will initiate treatment with migraine cocktail and obtain labs and CT without contrast.  Patient does report some mild improvement with Benadryl and Compazine, however does continue to have some pain and is now reporting dizziness and lightheadedness.  Discussed the findings, labs with Dr. Larinda Buttery.  He personally saw and evaluated the patient.  Given her history, will obtain CT with contrast of the head and neck to evaluate for vascular pathology.  Patient's creatinine and GFR is noted, however at this time feel that benefit of CT outweighs risk and will provide the patient 1 L of LR.  CT has returned and is negative.  Patient is reporting some slow continued improvement.  At this time, she is requesting discharge.  Case again discussed with Dr. Larinda Buttery who assessed the patient again.  Will prescribe outpatient Compazine for treatment of suspected trigeminal neuralgia and instructed her to follow-up  with neurology.  Patient is amenable with this plan she appears stable this time for outpatient therapy.  She will return to the emergency department for any acute worsening.      ____________________________________________   FINAL CLINICAL IMPRESSION(S) / ED DIAGNOSES  Final diagnoses:  Acute nonintractable headache, unspecified headache type  Trigeminal neuralgia of left side of face     ED Discharge Orders         Ordered    prochlorperazine (COMPAZINE) 5 MG tablet  Every 6 hours PRN        03/19/20 2132          *Please note:  Ann Price was evaluated in Emergency Department on 03/20/2020 for the symptoms described in the history of present illness. She was evaluated in the context of the global COVID-19 pandemic, which necessitated  consideration that the patient might be at risk for infection with the SARS-CoV-2 virus that causes COVID-19. Institutional protocols and algorithms that pertain to the evaluation of patients at risk for COVID-19 are in a state of rapid change based on information released by regulatory bodies including the CDC and federal and state organizations. These policies and algorithms were followed during the patient's care in the ED.  Some ED evaluations and interventions may be delayed as a result of limited staffing during and the pandemic.*   Note:  This document was prepared using Dragon voice recognition software and may include unintentional dictation errors.   Lucy ChrisRodgers, Jaydee Conran J, PA 03/20/20 1623    Chesley NoonJessup, Charles, MD 03/21/20 [phone]
